# Patient Record
Sex: Male | Born: 1955 | Race: Black or African American | Hispanic: No | Smoking: Current every day smoker
Health system: Southern US, Community
[De-identification: ages and names within clinical notes are randomized; demographics above are authoritative.]

## PROBLEM LIST (undated history)

## (undated) DIAGNOSIS — R251 Tremor, unspecified: Secondary | ICD-10-CM

## (undated) DIAGNOSIS — I10 Essential (primary) hypertension: Secondary | ICD-10-CM

## (undated) DIAGNOSIS — F419 Anxiety disorder, unspecified: Secondary | ICD-10-CM

## (undated) DIAGNOSIS — R7303 Prediabetes: Secondary | ICD-10-CM

## (undated) DIAGNOSIS — I219 Acute myocardial infarction, unspecified: Secondary | ICD-10-CM

## (undated) DIAGNOSIS — I2699 Other pulmonary embolism without acute cor pulmonale: Secondary | ICD-10-CM

## (undated) DIAGNOSIS — M1990 Unspecified osteoarthritis, unspecified site: Secondary | ICD-10-CM

## (undated) DIAGNOSIS — I2510 Atherosclerotic heart disease of native coronary artery without angina pectoris: Secondary | ICD-10-CM

## (undated) DIAGNOSIS — E785 Hyperlipidemia, unspecified: Secondary | ICD-10-CM

## (undated) DIAGNOSIS — M5430 Sciatica, unspecified side: Secondary | ICD-10-CM

## (undated) HISTORY — PX: HIP SURGERY: SHX245

## (undated) HISTORY — DX: Other pulmonary embolism without acute cor pulmonale: I26.99

## (undated) HISTORY — PX: TOTAL HIP ARTHROPLASTY: SHX124

## (undated) HISTORY — PX: FRACTURE SURGERY: SHX138

---

## 2013-08-21 DIAGNOSIS — F17200 Nicotine dependence, unspecified, uncomplicated: Secondary | ICD-10-CM | POA: Insufficient documentation

## 2014-02-15 DIAGNOSIS — E785 Hyperlipidemia, unspecified: Secondary | ICD-10-CM | POA: Insufficient documentation

## 2014-02-15 DIAGNOSIS — F32A Depression, unspecified: Secondary | ICD-10-CM | POA: Insufficient documentation

## 2014-02-15 DIAGNOSIS — F419 Anxiety disorder, unspecified: Secondary | ICD-10-CM | POA: Insufficient documentation

## 2014-02-15 DIAGNOSIS — F329 Major depressive disorder, single episode, unspecified: Secondary | ICD-10-CM | POA: Insufficient documentation

## 2014-02-15 DIAGNOSIS — Z833 Family history of diabetes mellitus: Secondary | ICD-10-CM | POA: Insufficient documentation

## 2014-02-15 DIAGNOSIS — Z96643 Presence of artificial hip joint, bilateral: Secondary | ICD-10-CM | POA: Insufficient documentation

## 2014-02-15 DIAGNOSIS — G479 Sleep disorder, unspecified: Secondary | ICD-10-CM | POA: Insufficient documentation

## 2014-02-15 DIAGNOSIS — F17210 Nicotine dependence, cigarettes, uncomplicated: Secondary | ICD-10-CM | POA: Insufficient documentation

## 2014-02-15 HISTORY — DX: Sleep disorder, unspecified: G47.9

## 2014-02-15 HISTORY — DX: Depression, unspecified: F32.A

## 2014-02-15 HISTORY — DX: Hyperlipidemia, unspecified: E78.5

## 2015-04-23 ENCOUNTER — Encounter: Payer: Self-pay | Admitting: *Deleted

## 2015-04-23 ENCOUNTER — Emergency Department
Admission: EM | Admit: 2015-04-23 | Discharge: 2015-04-23 | Disposition: A | Discharge status: Home / Self Care | Payer: Medicare PPO | Source: Home / Self Care | Attending: Family Medicine | Admitting: Family Medicine

## 2015-04-23 ENCOUNTER — Emergency Department (INDEPENDENT_AMBULATORY_CARE_PROVIDER_SITE_OTHER): Payer: Medicare PPO

## 2015-04-23 DIAGNOSIS — K219 Gastro-esophageal reflux disease without esophagitis: Secondary | ICD-10-CM

## 2015-04-23 DIAGNOSIS — R142 Eructation: Secondary | ICD-10-CM

## 2015-04-23 DIAGNOSIS — R079 Chest pain, unspecified: Secondary | ICD-10-CM

## 2015-04-23 HISTORY — DX: Hyperlipidemia, unspecified: E78.5

## 2015-04-23 HISTORY — DX: Essential (primary) hypertension: I10

## 2015-04-23 LAB — CBC WITH DIFFERENTIAL/PLATELET
Basophils Absolute: 0.1 10*3/uL (ref 0.0–0.1)
Basophils Relative: 1 % (ref 0–1)
Eosinophils Absolute: 0.2 10*3/uL (ref 0.0–0.7)
Eosinophils Relative: 3 % (ref 0–5)
HCT: 46.4 % (ref 39.0–52.0)
Hemoglobin: 16 g/dL (ref 13.0–17.0)
Lymphocytes Relative: 36 % (ref 12–46)
Lymphs Abs: 2.3 10*3/uL (ref 0.7–4.0)
MCH: 30.1 pg (ref 26.0–34.0)
MCHC: 34.5 g/dL (ref 30.0–36.0)
MCV: 87.4 fL (ref 78.0–100.0)
MPV: 10.6 fL (ref 8.6–12.4)
Monocytes Absolute: 0.6 10*3/uL (ref 0.1–1.0)
Monocytes Relative: 9 % (ref 3–12)
Neutro Abs: 3.3 10*3/uL (ref 1.7–7.7)
Neutrophils Relative %: 51 % (ref 43–77)
Platelets: 226 10*3/uL (ref 150–400)
RBC: 5.31 MIL/uL (ref 4.22–5.81)
RDW: 14.1 % (ref 11.5–15.5)
WBC: 6.4 10*3/uL (ref 4.0–10.5)

## 2015-04-23 LAB — COMPLETE METABOLIC PANEL WITH GFR
ALT: 16 U/L (ref 9–46)
AST: 23 U/L (ref 10–35)
Albumin: 4.6 g/dL (ref 3.6–5.1)
Alkaline Phosphatase: 56 U/L (ref 40–115)
BUN: 12 mg/dL (ref 7–25)
CO2: 24 mmol/L (ref 20–31)
Calcium: 9.4 mg/dL (ref 8.6–10.3)
Chloride: 108 mmol/L (ref 98–110)
Creat: 1.26 mg/dL (ref 0.70–1.33)
GFR, Est African American: 72 mL/min (ref >=60)
GFR, Est Non African American: 62 mL/min (ref >=60)
Glucose, Bld: 101 mg/dL — ABNORMAL HIGH (ref 65–99)
Potassium: 3.7 mmol/L (ref 3.5–5.3)
Sodium: 140 mmol/L (ref 135–146)
Total Bilirubin: 0.5 mg/dL (ref 0.2–1.2)
Total Protein: 7.6 g/dL (ref 6.1–8.1)

## 2015-04-23 MED ORDER — FAMOTIDINE 40 MG PO TABS
40.0000 mg | ORAL_TABLET | Freq: Once | ORAL | Status: AC
  Administered 2015-04-23: 40 mg via ORAL

## 2015-04-23 MED ORDER — OMEPRAZOLE 20 MG PO CPDR
20.0000 mg | DELAYED_RELEASE_CAPSULE | Freq: Every day | ORAL | Status: DC
Start: 2015-04-23 — End: 2018-08-30

## 2015-04-23 NOTE — ED Notes (Signed)
Pt c/o intermittent center of chest pain x 2 wks with SOB worse with exertion. He has taken Alka Seltzer, and drank vinegar and water with no relief. Reports some numbness and swelling in his LT hand intermittently.

## 2015-04-23 NOTE — ED Provider Notes (Signed)
CSN: [id number]     Arrival date & time 04/23/15  4098 History   First MD Initiated Contact with Patient 04/23/15 [id number]     Chief Complaint  Patient presents with  . Chest Pain   (Consider location/radiation/quality/duration/timing/severity/associated sxs/prior Treatment) HPI Pt is a 59yo male with hx of HTN and hyperlipidemia, presenting to [facility] with c/o intermittent centralized chest pain for the last 2 weeks. Pain is described as burning and squeezing in nature.  Pain is 8/10 at worst causing SOB when pain is more severe. He states this morning symptoms started when he woke up, he feel irritation in his chest and felt a "knot" in his throat as if something was stuck but he has been able to swallow without difficulty. Symptoms lasted about 5 minutes and improved after pt got out of bed and showered.  Symptoms returned while in the grocery store this morning.  He was walking around when he felt the irritation in his throat and chest again, then became mildly short of breath.  Pt was advised by his PCP 2 weeks ago when symptoms initially started that he needed to be evaluated at an urgent care or emergency department if symptoms returned.  He has been trying Catering manager and a vinegar water mixture that had been helping his symptoms but pt is not sure what causes his symptoms.  He does not he has been belching more frequently and symptoms improve when he sits up.  Pressure is relieved after he belches. He reports having similar chest pain 4-5 years ago and went to the ER where he had a normal EKG "everything was fine."  Last annual physical was about 8 months ago, blood pressure and cholesterol were under control. No family hx of CAD. No hx of blood clots. Denies leg pain or swelling. Denies recent travel or surgeries. Denies hx of asthma or COPD.  Past Medical History  Diagnosis Date  . Hypertension   . Hyperlipidemia    Past Surgical History  Procedure Laterality Date  . Hip surgery Bilateral     Family History  Problem Relation Age of Onset  . Diabetes Mother   . Diabetes Father    Social History  Substance Use Topics  . Smoking status: Current Some Day Smoker  . Smokeless tobacco: None     Comment: trying to quit smoking, smokes 3 cigs q mth, uses vapes daily.   . Alcohol Use: No    Review of Systems  Constitutional: Negative for fever, chills and diaphoresis.  HENT: Negative for congestion, ear pain, sore throat, trouble swallowing and voice change.   Respiratory: Positive for shortness of breath. Negative for cough.   Cardiovascular: Positive for chest pain. Negative for palpitations.  Gastrointestinal: Positive for abdominal pain (upper, discomfort ). Negative for nausea, vomiting and diarrhea.       Belching  Musculoskeletal: Negative for myalgias, back pain and arthralgias.  Skin: Negative for rash.    Allergies  Penicillins  Home Medications   Prior to Admission medications   Medication Sig Start Date End Date Taking? Authorizing Provider  HYDROcodone-acetaminophen (NORCO) 10-325 MG tablet Take 1 tablet by mouth every 6 (six) hours as needed.   Yes Historical Provider, MD  omeprazole (PRILOSEC) 20 MG capsule Take 1 capsule (20 mg total) by mouth daily. 04/23/15   Junius Finner, PA-C  pravastatin (PRAVACHOL) 20 MG tablet Take 20 mg by mouth daily.   Yes Historical Provider, MD   Meds Ordered and Administered this Visit  Medications  famotidine (PEPCID) tablet 40 mg (40 mg Oral Given 04/23/15 1015)    BP 121/85 mmHg  Pulse 89  Temp(Src) 97.8 F (36.6 C) (Oral)  Resp 18  Ht 5\' 11"  (1.803 m)  Wt 205 lb (92.987 kg)  BMI 28.60 kg/m2  SpO2 99% No data found.   Physical Exam  Constitutional: He appears well-developed and well-nourished. No distress.  HENT:  Head: Normocephalic and atraumatic.  Eyes: Conjunctivae are normal. No scleral icterus.  Neck: Normal range of motion. Neck supple.  Raspy voice. No stridor   Cardiovascular: Normal rate,  regular rhythm and normal heart sounds.   Pulmonary/Chest: Effort normal and breath sounds normal. No respiratory distress. He has no wheezes. He has no rales. He exhibits no tenderness.  Abdominal: Soft. Bowel sounds are normal. He exhibits no distension and no mass. There is tenderness ( mild epigastrium). There is no rebound and no guarding.  Musculoskeletal: Normal range of motion.  Neurological: [name] alert.  Skin: Skin is warm and dry. [name] not diaphoretic.  Nursing note and vitals reviewed.   ED Course  Procedures (including critical care time)  Labs Review Labs Reviewed  CBC WITH DIFFERENTIAL/PLATELET  COMPLETE METABOLIC PANEL WITH GFR    Imaging Review Dg Chest 2 View  04/23/2015  CLINICAL DATA:  Chest pain, shortness of breath for 2 days EXAM: CHEST  2 VIEW COMPARISON:  None. FINDINGS: Cardiomediastinal silhouette is unremarkable. No acute infiltrate or pleural effusion. No pulmonary edema. Bony thorax is unremarkable. IMPRESSION: No active cardiopulmonary disease. Electronically Signed   By: Natasha MeadLiviu  Pop M.D.   On: 04/23/2015 10:56    EKG: normal sinus rhythm, within normal limits.    MDM   1. Chest pain, unspecified chest pain type   2. Gastroesophageal reflux disease, esophagitis presence not specified   3. Belching     Pt c/o intermittent centralized chest pain that is burning and squeezing, improves when he sits up. Pain has been intermittent for 2 weeks. Normal EKG. Vitals- WNL, O2 Sat 97% on RA. No evidence of respiratory distress.  Low concern for EKG at this time, however, advised pt CAD cannot be completely ruled out in urgent care setting as cardiac enzyme labs are unavailable.  Pt verbalized understanding and [facility].   CXR: normal, no evidence of cardiopulmonary disease and no mention of hiatal hernia.  Discussed EKG and CXR with pt.   CBC and CMP also ordered, will send out labs.  Pt given pepcid 40mg  in UC, moderate relief  of symptoms. Pt able to swallow water w/o difficulty.  Symptoms most c/w GERD. Doubt ACS or PE at this time. Will start on trial of Omeprazole 20mg  daily, advised pt dose may need to be increased by his PCP.   Strongly encouraged pt to call 911 or go to closest emergency department immediately if chest pain or shortness of breath worsens, persists after 5 minutes of rest, or other new concerning symptoms develop. Patient verbalized understanding and agreement with treatment plan.    Junius Finnerrin O'Malley, PA-C 04/23/15 1144

## 2015-04-24 ENCOUNTER — Telehealth: Payer: Self-pay | Admitting: *Deleted

## 2015-07-01 ENCOUNTER — Ambulatory Visit: Payer: Medicare PPO | Admitting: Osteopathic Medicine

## 2015-08-22 ENCOUNTER — Emergency Department
Admission: EM | Admit: 2015-08-22 | Discharge: 2015-08-22 | Discharge status: Absent without leave | Payer: Medicare PPO | Source: Home / Self Care

## 2015-09-09 DIAGNOSIS — R911 Solitary pulmonary nodule: Secondary | ICD-10-CM | POA: Insufficient documentation

## 2015-09-09 DIAGNOSIS — N643 Galactorrhea not associated with childbirth: Secondary | ICD-10-CM | POA: Insufficient documentation

## 2015-09-09 DIAGNOSIS — C7A8 Other malignant neuroendocrine tumors: Secondary | ICD-10-CM | POA: Insufficient documentation

## 2015-09-09 DIAGNOSIS — I2699 Other pulmonary embolism without acute cor pulmonale: Secondary | ICD-10-CM | POA: Diagnosis present

## 2015-09-09 DIAGNOSIS — R918 Other nonspecific abnormal finding of lung field: Secondary | ICD-10-CM | POA: Insufficient documentation

## 2015-09-09 DIAGNOSIS — Z86711 Personal history of pulmonary embolism: Secondary | ICD-10-CM | POA: Insufficient documentation

## 2015-09-09 HISTORY — DX: Other pulmonary embolism without acute cor pulmonale: I26.99

## 2016-06-12 ENCOUNTER — Encounter: Payer: Self-pay | Admitting: Emergency Medicine

## 2016-06-12 ENCOUNTER — Emergency Department (INDEPENDENT_AMBULATORY_CARE_PROVIDER_SITE_OTHER)
Admission: EM | Admit: 2016-06-12 | Discharge: 2016-06-12 | Disposition: A | Discharge status: Home / Self Care | Payer: Medicare PPO | Source: Home / Self Care | Attending: Family Medicine | Admitting: Family Medicine

## 2016-06-12 ENCOUNTER — Emergency Department (INDEPENDENT_AMBULATORY_CARE_PROVIDER_SITE_OTHER): Payer: Medicare PPO

## 2016-06-12 DIAGNOSIS — R0602 Shortness of breath: Secondary | ICD-10-CM | POA: Diagnosis not present

## 2016-06-12 DIAGNOSIS — R05 Cough: Secondary | ICD-10-CM | POA: Diagnosis not present

## 2016-06-12 DIAGNOSIS — F17210 Nicotine dependence, cigarettes, uncomplicated: Secondary | ICD-10-CM | POA: Diagnosis not present

## 2016-06-12 DIAGNOSIS — J40 Bronchitis, not specified as acute or chronic: Secondary | ICD-10-CM | POA: Diagnosis not present

## 2016-06-12 DIAGNOSIS — R509 Fever, unspecified: Secondary | ICD-10-CM

## 2016-06-12 MED ORDER — ALBUTEROL SULFATE HFA 108 (90 BASE) MCG/ACT IN AERS: 1.0000 | INHALATION_SPRAY | Freq: Four times a day (QID) | RESPIRATORY_TRACT | 0 refills | Status: DC | PRN

## 2016-06-12 MED ORDER — BENZONATATE 100 MG PO CAPS: 100.0000 mg | ORAL_CAPSULE | Freq: Three times a day (TID) | ORAL | 0 refills | Status: DC

## 2016-06-12 MED ORDER — AEROCHAMBER PLUS W/MASK MISC: 2 refills | Status: DC

## 2016-06-12 MED ORDER — PREDNISONE 20 MG PO TABS: ORAL_TABLET | ORAL | 0 refills | Status: DC

## 2016-06-12 NOTE — ED Triage Notes (Signed)
Reports worsening congestion and cough over past 3 days; subjective fever. No OTCs today..Marland Kitchen

## 2016-06-12 NOTE — Discharge Instructions (Signed)
°Emergency Department Resource Guide °1) Find a Doctor and Pay Out of Pocket °Although you won't have to find out who is covered by your insurance plan, it is a good idea to ask around and get recommendations. You will then need to call the office and see if the doctor you have chosen will accept you as a new patient and what types of options they offer for patients who are self-pay. Some doctors offer discounts or will set up payment plans for their patients who do not have insurance, but you will need to ask so you aren't surprised when you get to your appointment. ° °2) Contact Your Local Health Department °Not all health departments have doctors that can see patients for sick visits, but many do, so it is worth a call to see if yours does. If you don't know where your local health department is, you can check in your phone book. The CDC also has a tool to help you locate your state's health department, and many state websites also have listings of all of their local health departments. ° °3) Find a Walk-in Clinic °If your illness is not likely to be very severe or complicated, you may want to try a walk in clinic. These are popping up all over the country in pharmacies, drugstores, and shopping centers. They're usually staffed by nurse practitioners or physician assistants that have been trained to treat common illnesses and complaints. They're usually fairly quick and inexpensive. However, if you have serious medical issues or chronic medical problems, these are probably not your best option. ° °No Primary Care Doctor: °- Call [facility] Connect at  832-8000 - they can help you locate a primary care doctor that  accepts your insurance, provides certain services, etc. °- Physician Referral Service- 1-800-533-3463 ° °Chronic Pain Problems: °Organization         Address  Phone   Notes  °[facility]  (336) 297-2271 Patients need to be referred by their primary care doctor.  ° °Medication  Assistance: °Organization         Address  Phone   Notes  °[place] [facility] [address] °[place], [place] [address] (336) 641-8030 --Must be a resident of [place] °-- Must have NO insurance coverage whatsoever (no Medicaid/ Medicare, etc.) °-- The pt. MUST have a primary care doctor that directs their care regularly and follows them in the community °  °[facility]  (866) 331-1348   °[employer]  (888) 892-1162   ° °Agencies that provide inexpensive medical care: °Organization         Address  Phone   Notes  °[facility]  (336) 832-8035   °[facility]    (336) 832-7272   °[facility] [address] °[place], [place] [address] (336) 832-4777   °[facility] [address], °[place] (336) 271-4999   °[facility]    (336) 373-0678   °[facility]    (336) 272-1050   °[facility] ° [address], [place] Phone:  (336) 832-4444, Fax:  (336) 832-4440 Hours of Operation:  9 am - 6 pm, M-F.  Also accepts Medicaid/Medicare and self-pay.  °[facility] ° [address], [place] Phone: (336) 832-3150, Fax: (336) [phone]. Hours of Operation:  8:30 am - 5:30 pm, M-F.  Also accepts Medicaid and self-pay.  °[facility] [place] [address], [place] Phone: (336) 878-6027   °[facility] [address], [place], [place] (336)723-1848, Ext. 123 Mondays & Thursdays: 7-9 AM.  First 15 patients are seen on a first come, first serve basis. °  ° °Medicaid-accepting [place] Providers: ° °Organization         Address  Phone   Notes  °[facility] [address], [place] (336) 641-2100 Also accepts self-pay patients.  °[facility] [address], [place] ° (336) 856-9996   °[facility] [address], [place]  (336) 288-8857   °[facility] [address] [place] [address], [place] (336) 299-7000   °Veita Bland [address], [place]  ° (336) 373-1557 Only accepts [place] Access Medicaid patients after they have their name applied to their card.  ° °Self-Pay (no insurance) in [place]: ° °Organization         Address  Phone   Notes  °Sickle Cell Patients, [facility] [address], [place] (336) 832-1970   °[facility] [address], [place] (336) 832-4400   °[facility] ° [address], [place] (336) 992-4800   °[facility]/Dr. Osei-Bonsu ° [address] [place] [address], [place] or [address], [place] (336) 841-8500 Phone number for both [place] and [place] locations is the same.  °[facility] [address], [place] (336) 299-0000   °[facility] [address] [place] [address], [place] or [address] (336) 852-7530 °(336) 878-2260   °[facility] [address], [place] (336) 350-1642, phone; (336) 294-5005, fax Sees patients 1st and 3rd Saturday of every month.  Must not qualify for public or private insurance (i.e. Medicaid, Medicare, [place] Health Choice, Veterans' Benefits) • Household income should be no more than 200% of the poverty level •The clinic cannot treat you if you are pregnant or think you are pregnant • Sexually transmitted diseases are not treated at the clinic.  ° ° °Dental Care: °Organization         Address  Phone  Notes  °[place] [facility] [address], [place] (336) 641-6152 Accepts children up to age 21 who are enrolled in Medicaid or [place] Health Choice; pregnant women with a Medicaid card; and children who have applied for Medicaid or [place] Health Choice, but were declined, whose parents can pay a reduced fee at time of service.  °[place]  [facility] [place]  [address], [place] (336) 641-7733 Accepts children up to age 21 who are enrolled in Medicaid or [place] Health Choice; pregnant women with a Medicaid card; and children who have applied for Medicaid or [place] Health Choice, but were declined, whose parents can pay a reduced fee at time of service.  °[facility] ° [address], [place] (336) 641-4533 Patients are seen by appointment only. Walk-ins are not accepted. [facility] will see patients 18 years of age and older. °Monday - Tuesday (8am-5pm) °Most Wednesdays (8:30-5pm) °$30 per visit, cash only  °[facility] ° [address], [place] (336) 641-4533 Patients are seen by appointment only. Walk-ins are not accepted. [facility] will see patients 18 years of age and older. °One   Wednesday Evening (Monthly: Volunteer Based).  $30 per visit, cash only  °[facility]  (919) 537-3737 for adults; Children under age 4, call Graduate Pediatric Dentistry at ([phone]) [phone]. Children aged 4-14, please call (919) [phone] to request a pediatric application. ° Dental services are provided in all areas of dental care including fillings, crowns and bridges, complete and partial dentures, implants, gum treatment, root canals, and extractions. Preventive care is also provided. Treatment is provided to both adults and children. °Patients are selected via a lottery and there is often a waiting list. °  °[facility] [address], °[place] ° (336) 763-8833 [email or web address] °  °[facility] [address], [place], [place] (336)723-1848, Ext. 123 Second and Fourth Thursday of each month, opens at 6:30 AM; Clinic ends at 9 AM.  Patients are seen on a first-come first-served basis, and a limited number are seen during each clinic.  ° °[facility] ° [address], [place], [place] (336) 723-7904    Eligibility Requirements °You must have lived in [place], [place], or [place] counties for at least the last three months. °  You cannot be eligible for state or federal sponsored healthcare insurance, including Veterans Administration, Medicaid, or Medicare. °  You generally cannot be eligible for healthcare insurance through your employer.  °  How to apply: °Eligibility screenings are held every Tuesday and Wednesday afternoon from 1:00 pm until 4:00 pm. You do not need an appointment for the interview!  °[facility] [address], [place], [place] 336-631-2330   °[facility] [facility]  336-342-8273   °[facility]  336-703-3100   °[facility]  336-570-6415   ° °Behavioral Health Resources in the Community: °Intensive Outpatient Programs °Organization         Address  Phone  Notes  °[facility] [address], [place], [place] 336-878-6098   °[facility] [address], [place], [place] 336-832-9800   °[facility] [address], [place], [place] ° 336-882-2125   °[facility] [address],  °[place], [place] 1-800-853-5163 or 336-641-4981   °Substance Abuse Resources °Organization         Address  Phone  Notes  °[facility]  336-882-2125   °[facility]  336-784-9470   °[facility]  336-285-9073   °[facility]  336-845-3988   °Residential & Outpatient Substance Abuse Program  1-800-659-3381   °Psychological Services °Organization         Address  Phone  Notes  °[facility]  336- 832-9600   °[facility]  336- 378-7881   °[facility] [address], [place] 1-800-853-5163 or 336-641-4981   ° °Mobile Crisis Teams °Organization         Address  Phone  Notes  °[facility], Mobile Crisis Care Unit  1-877-626-1772   °Assertive °[facility] ° [address]  [place], [place] 336-834-9664   °Sharon DeEsch [address] °[place] 336-554-5454   ° °Self-Help/Support Groups °Organization         Address  Phone             Notes  °[facility]  - variety of support groups  336- 373-1402 Call for more information  °Narcotics Anonymous (NA), [facility] [address], °[place]  2 meetings at this location  ° °  Residential Treatment Programs °Organization         Address  Phone  Notes  °[facility] [address],    °[place]  1-866-801-8205   °[facility] ° [address], [place], [place] 704-293-8524   °[facility] [address], [place] 336-845-3988 Admissions: 8am-3pm M-F  °[facility] [address]    °[place], [place] 336-841-1104   °[facility] [address], [place], [place] 336-379-7146   °[facility] [address]  °[place], [place] 336-285-9073   °[facility] - Intensive Outpatient [address], [place], [place] 336-852-3033   °[facility] [address]  °[place], [place] 1-877-615-2722 or 336-784-9470   °[facility] [address] [place], [place] 336-227-7417 Accepts Medicaid  °[facility] [address]  °[place] 1-800-659-3381 Substance Abuse/Addiction Treatment  ° °[place] Behavioral Health Resources °Organization         Address  Phone  Notes  °[facility]  (888) 581-9988   °Julie Brannon, PhD [address] [place], [place]   (336) 349-5553 or (336) 951-0000   °[facility]   [address] °[place], [place] (336) 349-4454   °[facility] [address], [place], [place] (336) 342-8316 Insurance/Medicaid/sponsorship through [facility]  °[facility] [address]                                    [place], [place] (336) 342-8316 Therapy/tele-psych/case    °[facility] [address]  ° [place],  (336) 349-2233    °Dr. Arfeen  (336) 349-4544   °[facility] [place]  [employer] [place] [facility] 1) [address],  °2) [address], [place] °3)  [address], [place] (336) 349-3220 °(336) 342-7768 ° °(336) 342-8140   °[place] Child Abuse Hotline (336) 342-1394 or (336) 342-3537 (After Hours)    ° ° °

## 2016-06-12 NOTE — ED Provider Notes (Signed)
CSN: [id number]     Arrival date & time 06/12/16  1127 History   First MD Initiated Contact with Patient 06/12/16 1224     Chief Complaint  Patient presents with  . Nasal Congestion  . Cough   (Consider location/radiation/quality/duration/timing/severity/associated sxs/prior Treatment) HPI  Jesse [name] is a 61 y.o. male presenting to UC with c/o worsening cough and congestion for 3 days.  He states he has had SOB for about 1.5 years but over the last 3 days symptoms have worsened with night sweats and difficulty breathing when lying down.  He has a hx of a PE but notes he completed a course of his blood thinner and was advised he would no longer need to take it.  [name] requesting a referral to PCP for f/u as he does not have one at this time.     Past Medical History:  Diagnosis Date  . Hyperlipidemia   . Hypertension    Past Surgical History:  Procedure Laterality Date  . HIP SURGERY Bilateral    Family History  Problem Relation Age of Onset  . Diabetes Mother   . Diabetes Father    Social History  Substance Use Topics  . Smoking status: Current Some Day Smoker  . Smokeless tobacco: Never Used     Comment: trying to quit smoking, smokes 3 cigs q mth, uses vapes daily.   . Alcohol use No    Review of Systems  Constitutional: Negative for chills and fever.  HENT: Positive for congestion. Negative for ear pain, sore throat, trouble swallowing and voice change.   Respiratory: Positive for cough and shortness of breath.   Cardiovascular: Negative for chest pain and palpitations.  Gastrointestinal: Negative for abdominal pain, diarrhea, nausea and vomiting.  Musculoskeletal: Negative for arthralgias, back pain and myalgias.  Skin: Negative for rash.    Allergies  Penicillins  Home Medications   Prior to Admission medications   Medication Sig Start Date End Date Taking? Authorizing Provider  albuterol (PROVENTIL HFA;VENTOLIN HFA) 108 (90 Base) MCG/ACT inhaler Inhale  1-2 puffs into the lungs every 6 (six) hours as needed for wheezing or shortness of breath. 06/12/16   Junius Finner, PA-C  benzonatate (TESSALON) 100 MG capsule Take 1-2 capsules (100-200 mg total) by mouth every 8 (eight) hours. 06/12/16   Junius Finner, PA-C  HYDROcodone-acetaminophen (NORCO) 10-325 MG tablet Take 1 tablet by mouth every 6 (six) hours as needed.    Historical Provider, MD  omeprazole (PRILOSEC) 20 MG capsule Take 1 capsule (20 mg total) by mouth daily. 04/23/15   Junius Finner, PA-C  pravastatin (PRAVACHOL) 20 MG tablet Take 20 mg by mouth daily.    Historical Provider, MD  predniSONE (DELTASONE) 20 MG tablet 3 tabs po day one, then 2 po daily x 4 days 06/12/16   Junius Finner, PA-C  Spacer/Aero-Holding Chambers (AEROCHAMBER PLUS WITH MASK) inhaler Use as instructed 06/12/16   Junius Finner, PA-C   Meds Ordered and Administered this Visit  Medications - No data to display  BP 113/78 (BP Location: Left Arm)   Pulse 75   Temp 98.4 F (36.9 C) (Oral)   Resp 16   Ht 5\' 11"  (1.803 m)   Wt 197 lb (89.4 kg)   SpO2 99%   BMI 27.48 kg/m  No data found.   Physical Exam  Constitutional: [name] oriented to person, place, and time. He appears well-developed and well-nourished. No distress.  HENT:  Head: Normocephalic and atraumatic.  Right Ear: Tympanic  membrane normal.  Left Ear: Tympanic membrane normal.  Nose: Mucosal edema present. Right sinus exhibits no maxillary sinus tenderness and no frontal sinus tenderness. Left sinus exhibits no maxillary sinus tenderness and no frontal sinus tenderness.  Eyes: EOM are normal.  Neck: Normal range of motion. Neck supple.  Cardiovascular: Normal rate and regular rhythm.   Pulmonary/Chest: Effort normal. No stridor. No respiratory distress. He has wheezes ( faint expiratory). He has no rales.  Musculoskeletal: Normal range of motion.  Lymphadenopathy:    He has no cervical adenopathy.  Neurological: [name] alert and oriented to person,  place, and time.  Skin: Skin is warm and dry. [name] not diaphoretic.  Psychiatric: He has a normal mood and affect. His behavior is normal.  Nursing note and vitals reviewed.   Urgent Care Course     Procedures (including critical care time)  Labs Review Labs Reviewed - No data to display  Imaging Review Dg Chest 2 View  Result Date: 06/12/2016 CLINICAL DATA:  Pt states that for the past 3 days he has had a cough with congestion, sob, weakness and chills. Smokes 1 pk of cigarettes every 3 days. EXAM: CHEST  2 VIEW COMPARISON:  04/23/2015 FINDINGS: Midline trachea. Normal heart size and mediastinal contours. No pleural effusion or pneumothorax. Diffuse peribronchial thickening. Clear lungs. IMPRESSION: 1.  No acute cardiopulmonary disease. 2. Mild peribronchial thickening which may relate to chronic bronchitis or smoking. Electronically Signed   By: Jeronimo GreavesKyle  Talbot M.D.   On: 06/12/2016 13:08      MDM   1. Bronchitis    Pt c/o worsening SOB for 3 days with night sweats. O2 Sat 99% on RA. Doubt PE at this time.  CXR c/w chronic bronchitis changes.  Rx: Prednisone, tessalon, and albuterol inhaler.  Encouraged to establish care with a PCP. Resource guide provided. Patient verbalized understanding and agreement with treatment plan.     Junius Finnerrin O'Malley, PA-C 06/12/16 [phone]

## 2017-07-30 ENCOUNTER — Emergency Department
Admission: EM | Admit: 2017-07-30 | Discharge: 2017-07-30 | Disposition: A | Discharge status: Home / Self Care | Payer: Medicare PPO | Source: Home / Self Care | Attending: Family Medicine | Admitting: Family Medicine

## 2017-07-30 ENCOUNTER — Emergency Department (INDEPENDENT_AMBULATORY_CARE_PROVIDER_SITE_OTHER): Payer: Medicare PPO

## 2017-07-30 ENCOUNTER — Other Ambulatory Visit: Payer: Self-pay

## 2017-07-30 ENCOUNTER — Encounter: Payer: Self-pay | Admitting: Emergency Medicine

## 2017-07-30 DIAGNOSIS — M5116 Intervertebral disc disorders with radiculopathy, lumbar region: Secondary | ICD-10-CM

## 2017-07-30 DIAGNOSIS — M4317 Spondylolisthesis, lumbosacral region: Secondary | ICD-10-CM | POA: Diagnosis not present

## 2017-07-30 DIAGNOSIS — M5137 Other intervertebral disc degeneration, lumbosacral region: Secondary | ICD-10-CM

## 2017-07-30 DIAGNOSIS — M25552 Pain in left hip: Secondary | ICD-10-CM | POA: Diagnosis not present

## 2017-07-30 HISTORY — DX: Tremor, unspecified: R25.1

## 2017-07-30 HISTORY — DX: Sciatica, unspecified side: M54.30

## 2017-07-30 MED ORDER — PREDNISONE 20 MG PO TABS: ORAL_TABLET | ORAL | 0 refills | Status: DC

## 2017-07-30 MED ORDER — METHYLPREDNISOLONE SODIUM SUCC 125 MG IJ SOLR
80.0000 mg | Freq: Once | INTRAMUSCULAR | Status: AC
  Administered 2017-07-30: 80 mg via INTRAMUSCULAR

## 2017-07-30 NOTE — ED Provider Notes (Signed)
[facility]    CSN: [id number] Arrival date & time: 07/30/17  1154     History   Chief Complaint Chief Complaint  Patient presents with  . Hip Pain  . Leg Pain    HPI Jesse [name] is a 62 y.o. male.   Patient has had intermittent recurring left lower back pain for several years, which has become constant during the past 2 weeks.  The pain now frequently radiates to his left lateral thigh and below to his left calf.  He occasionally has brief tingling in his left foot and toes.  He notes that the pain decreases when he flexes his left knee and hip together.   He denies bowel or bladder dysfunction, and no saddle numbness.  He has a past history of left hip replacement 2012 and right hip replacement 2014 because of osteoarthritis.  He feels well otherwise.  No fevers, chills, and sweats or weight loss.   The history is provided by the patient.  Back Pain  Location:  Lumbar spine and sacro-iliac joint Quality:  Aching Radiates to:  L posterior upper leg, L thigh and L foot Pain severity:  Mild Pain is:  Same all the time Onset quality:  Gradual Duration:  2 weeks Timing:  Constant Progression:  Waxing and waning Chronicity:  Chronic Context: not recent injury   Relieved by:  Nothing Worsened by:  Movement and ambulation Ineffective treatments:  OTC medications Associated symptoms: numbness and paresthesias   Associated symptoms: no abdominal pain, no abdominal swelling, no bladder incontinence, no bowel incontinence, no chest pain, no dysuria, no fever, no headaches, no perianal numbness, no tingling, no weakness and no weight loss     Past Medical History:  Diagnosis Date  . Hyperlipidemia   . Hypertension   . Sciatic nerve pain   . Tremors of nervous system     There are no active problems to display for this patient.   Past Surgical History:  Procedure Laterality Date  . HIP SURGERY Bilateral   . TOTAL HIP ARTHROPLASTY Bilateral        Home  Medications    Prior to Admission medications   Medication Sig Start Date End Date Taking? Authorizing Provider  albuterol (PROVENTIL HFA;VENTOLIN HFA) 108 (90 Base) MCG/ACT inhaler Inhale 1-2 puffs into the lungs every 6 (six) hours as needed for wheezing or shortness of breath. 06/12/16   Lurene Shadow, PA-C  benzonatate (TESSALON) 100 MG capsule Take 1-2 capsules (100-200 mg total) by mouth every 8 (eight) hours. 06/12/16   Lurene Shadow, PA-C  HYDROcodone-acetaminophen (NORCO) 10-325 MG tablet Take 1 tablet by mouth every 6 (six) hours as needed.    [provider]  omeprazole (PRILOSEC) 20 MG capsule Take 1 capsule (20 mg total) by mouth daily. 04/23/15   Lurene Shadow, PA-C  pravastatin (PRAVACHOL) 20 MG tablet Take 20 mg by mouth daily.    [provider]  predniSONE (DELTASONE) 20 MG tablet Take one tab by mouth twice daily for 4 days, then one daily for 3 days. Take with food. 07/30/17   Lattie Haw, MD  Spacer/Aero-Holding Chambers (AEROCHAMBER PLUS WITH MASK) inhaler Use as instructed 06/12/16   Lurene Shadow, PA-C    Family History Family History  Problem Relation Age of Onset  . Diabetes Mother   . Diabetes Father     Social History Social History   Tobacco Use  . Smoking status: Current Some Day Smoker  . Smokeless  tobacco: Never Used  . Tobacco comment: trying to quit smoking, smokes 3 cigs q mth, uses vapes daily.   Substance Use Topics  . Alcohol use: No  . Drug use: No     Allergies   Penicillins   Review of Systems Review of Systems  Constitutional: Negative for fever and weight loss.  Cardiovascular: Negative for chest pain.  Gastrointestinal: Negative for abdominal pain and bowel incontinence.  Genitourinary: Negative for bladder incontinence and dysuria.  Musculoskeletal: Positive for back pain.  Neurological: Positive for numbness and paresthesias. Negative for tingling, weakness and headaches.  All other systems reviewed and  are negative.    Physical Exam Triage Vital Signs ED Triage Vitals  Enc Vitals Group     BP      Pulse      Resp      Temp      Temp src      SpO2      Weight      Height      Head Circumference      Peak Flow      Pain Score      Pain Loc      Pain Edu?      Excl. in GC?    No data found.  Updated Vital Signs BP (!) 136/95 (BP Location: Right Arm)   Pulse 79   Temp 97.9 F (36.6 C) (Oral)   Resp 16   Ht 5\' 11"  (1.803 m)   Wt 195 lb (88.5 kg)   SpO2 99%   BMI 27.20 kg/m   Visual Acuity Right Eye Distance:   Left Eye Distance:   Bilateral Distance:    Right Eye Near:   Left Eye Near:    Bilateral Near:     Physical Exam  Constitutional: He appears well-developed and well-nourished. No distress.  HENT:  Head: Normocephalic.  Right Ear: External ear normal.  Left Ear: External ear normal.  Nose: Nose normal.  Mouth/Throat: Oropharynx is clear and moist.  Eyes: Pupils are equal, round, and reactive to light.  Neck: Normal range of motion.  Cardiovascular: Normal heart sounds.  Pulmonary/Chest: Breath sounds normal.  Abdominal: Soft. There is no tenderness.  Musculoskeletal: He exhibits no edema.       Back:  Back:   Can heel/toe walk and squat without difficulty.  Decreased forward flexion.  Tenderness in the midline from L1 to sacrum, and left paraspinous tenderness from L3 to SI joint.  Straight leg raising test is positive on the left..  Sitting knee extension test is positive on the left.  Strength and sensation in the lower extremities is normal.  Patellar and achilles reflexes are normal.  Left hip has decreased internal/external rotational range of motion.  Neurological: [name] alert.  Skin: Skin is warm and dry. No rash noted.  Nursing note and vitals reviewed.    UC Treatments / Results  Labs (all labs ordered are listed, but only abnormal results are displayed) Labs Reviewed - No data to display  EKG  EKG Interpretation None        Radiology Dg Lumbar Spine Complete  Result Date: 07/30/2017 CLINICAL DATA:  Left lower back pain radiating to the left hip for the past 6 months. EXAM: LUMBAR SPINE - COMPLETE 4+ VIEW COMPARISON:  None. FINDINGS: Five lumbar type vertebral bodies. No acute fracture or subluxation. Vertebral body heights are preserved. Trace anterolisthesis at L5-S1. Possible L5 pars defects. Moderate disc height loss at L5-S1. Remaining  intervertebral disc spaces are maintained. Mild bilateral facet arthropathy at L4-L5 and L5-S1. IMPRESSION: 1. Moderate degenerative disc disease at L5-S1. 2. Trace anterolisthesis at L5-S1 with possible L5 pars defects. Electronically Signed   By: Obie DredgeWilliam T Derry M.D.   On: 07/30/2017 13:22   Dg Hip Unilat W Or Wo Pelvis 2-3 Views Left  Result Date: 07/30/2017 CLINICAL DATA:  Increasing left hip pain radiating to the low back over the past 6 months. EXAM: DG HIP (WITH OR WITHOUT PELVIS) 2-3V LEFT COMPARISON:  None. FINDINGS: Postsurgical changes related to bilateral total hip arthroplasties. No evidence of hardware failure or loosening. No acute fracture or dislocation. The pubic symphysis and sacroiliac joints are intact. Osteopenia. IMPRESSION: 1. Prior bilateral total hip arthroplasties. No acute osseous abnormality. Electronically Signed   By: Obie DredgeWilliam T Derry M.D.   On: 07/30/2017 13:19    Procedures Procedures (including critical care time)  Medications Ordered in UC Medications  methylPREDNISolone sodium succinate (SOLU-MEDROL) 125 mg/2 mL injection 80 mg (80 mg Intramuscular Given 07/30/17 1359)     Initial Impression / Assessment and Plan / UC Course  I have reviewed the triage vital signs and the nursing notes.  Pertinent labs & imaging results that were available during my care of the patient were reviewed by me and considered in my medical decision making (see chart for details).    Administered Solumedrol 80mg  IM. Begin prednisone burst/taper Saturday  07/31/17. Apply ice pack to lower back for 20 to 30 minutes, 3 to 4 times daily  Continue until pain decreases.  Followup with Dr. Rodney Langtonhomas Thekkekandam or Dr. Clementeen GrahamEvan Corey ([facility]) in about 5 days for further management.    Final Clinical Impressions(s) / UC Diagnoses   Final diagnoses:  Lumbar disc disease with radiculopathy    ED Discharge Orders        Ordered    predniSONE (DELTASONE) 20 MG tablet     07/30/17 1354           Lattie HawBeese, Sadia Belfiore A, MD 07/30/17 1621

## 2017-07-30 NOTE — Discharge Instructions (Signed)
Begin prednisone Saturday 07/31/17. Apply ice pack to lower back for 20 to 30 minutes, 3 to 4 times daily  Continue until pain decreases.

## 2017-07-30 NOTE — ED Triage Notes (Signed)
Patient has had intermittent pain in hips and legs for at least 5 years; once was told he has sciatic nerve pain; at one time was on hydrocodone and anti-hypertensive and meds for hyperlipidemia but does not get consistent medical care.

## 2017-08-05 ENCOUNTER — Encounter: Payer: Medicare PPO | Admitting: Family Medicine

## 2017-08-05 DIAGNOSIS — Z0189 Encounter for other specified special examinations: Secondary | ICD-10-CM

## 2017-11-05 ENCOUNTER — Other Ambulatory Visit: Payer: Self-pay

## 2017-11-05 ENCOUNTER — Encounter: Payer: Self-pay | Admitting: Emergency Medicine

## 2017-11-05 ENCOUNTER — Emergency Department (INDEPENDENT_AMBULATORY_CARE_PROVIDER_SITE_OTHER)
Admission: EM | Admit: 2017-11-05 | Discharge: 2017-11-05 | Disposition: A | Discharge status: Home / Self Care | Payer: Medicare PPO | Source: Home / Self Care | Attending: Family Medicine | Admitting: Family Medicine

## 2017-11-05 DIAGNOSIS — G8929 Other chronic pain: Secondary | ICD-10-CM

## 2017-11-05 DIAGNOSIS — M5442 Lumbago with sciatica, left side: Secondary | ICD-10-CM

## 2017-11-05 DIAGNOSIS — M25511 Pain in right shoulder: Secondary | ICD-10-CM

## 2017-11-05 MED ORDER — MELOXICAM 15 MG PO TABS: 15.0000 mg | ORAL_TABLET | Freq: Every day | ORAL | 0 refills | Status: DC | PRN

## 2017-11-05 MED ORDER — PREDNISONE 20 MG PO TABS: ORAL_TABLET | ORAL | 0 refills | Status: DC

## 2017-11-05 NOTE — ED Triage Notes (Signed)
Patient having flare of chronic back, hip and shoulder pains; out of medication; did not follow up with sports medicine.

## 2017-11-05 NOTE — Discharge Instructions (Signed)
°  Meloxicam (Mobic) is an antiinflammatory to help with pain and inflammation.  Do not take ibuprofen, Advil, Aleve, or any other medications that contain NSAIDs while taking meloxicam as this may cause stomach upset or even ulcers if taken in large amounts for an extended period of time.   Please take the prednisone as prescribed.  It should be completed in 5 days.  You may then take the meloxicam once daily as needed.   Please call to schedule a follow up appointment with Sports Medicine, Dr. Denyse Amassorey or Dr. Benjamin Stainhekkekandam (Dr. Karie Schwalbe) for further evaluation and treatment of your chronic shoulder, lower back and hip pain.

## 2017-11-05 NOTE — ED Provider Notes (Signed)
[facility]    CSN: [id number] Arrival date & time: 11/05/17  1101     History   Chief Complaint Chief Complaint  Patient presents with  . Hip Pain    left  . Back Pain  . Shoulder Pain    right    HPI Jesse MuttonMalik Mccorvey is a 62 y.o. male.   HPI Jesse [name] is a 62 y.o. male presenting to UC with c/o gradually worsening Right shoulder and Left lower back pain over the last 2-3 weeks.  Pain has been intermittent for several months.  Pain is aching and sore, moderate in severity. Minimal relief with ibuprofen.  He was seen for same on 07/30/17. He was prescribed prednisone at the time and pain improved significantly.  He was advised to f/u with Sports Medicine but pt "chickened out" and did not wind up going to his appointment. He denies new injuries but states he has a lot of pain from working [employer] for several years.  He also reports having bilateral hip replacement due to osteoarthritis.  [name] requesting a refill of his prednisone.    Past Medical History:  Diagnosis Date  . Hyperlipidemia   . Hypertension   . Sciatic nerve pain   . Tremors of nervous system     There are no active problems to display for this patient.   Past Surgical History:  Procedure Laterality Date  . HIP SURGERY Bilateral   . TOTAL HIP ARTHROPLASTY Bilateral        Home Medications    Prior to Admission medications   Medication Sig Start Date End Date Taking? Authorizing Provider  albuterol (PROVENTIL HFA;VENTOLIN HFA) 108 (90 Base) MCG/ACT inhaler Inhale 1-2 puffs into the lungs every 6 (six) hours as needed for wheezing or shortness of breath. 06/12/16   Lurene ShadowPhelps, Keylani Perlstein O, PA-C  benzonatate (TESSALON) 100 MG capsule Take 1-2 capsules (100-200 mg total) by mouth every 8 (eight) hours. 06/12/16   Lurene ShadowPhelps, Alfreddie Consalvo O, PA-C  HYDROcodone-acetaminophen (NORCO) 10-325 MG tablet Take 1 tablet by mouth every 6 (six) hours as needed.    [provider]  meloxicam (MOBIC) 15 MG tablet  Take 1 tablet (15 mg total) by mouth daily as needed for pain. 11/05/17   Lurene ShadowPhelps, Rubin Dais O, PA-C  omeprazole (PRILOSEC) 20 MG capsule Take 1 capsule (20 mg total) by mouth daily. 04/23/15   Lurene ShadowPhelps, Talma Aguillard O, PA-C  pravastatin (PRAVACHOL) 20 MG tablet Take 20 mg by mouth daily.    [provider]  predniSONE (DELTASONE) 20 MG tablet 3 tabs po day one, then 2 po daily x 4 days 11/05/17   Lurene ShadowPhelps, Hildegard Hlavac O, PA-C  Spacer/Aero-Holding Chambers (AEROCHAMBER PLUS WITH MASK) inhaler Use as instructed 06/12/16   Lurene ShadowPhelps, Valon Glasscock O, PA-C    Family History Family History  Problem Relation Age of Onset  . Diabetes Mother   . Diabetes Father     Social History Social History   Tobacco Use  . Smoking status: Current Some Day Smoker  . Smokeless tobacco: Never Used  . Tobacco comment: trying to quit smoking, smokes 3 cigs q mth, uses vapes daily.   Substance Use Topics  . Alcohol use: No  . Drug use: No     Allergies   Penicillins   Review of Systems Review of Systems  Constitutional: Negative for chills and fever.  Genitourinary: Negative for dysuria, flank pain and frequency.  Musculoskeletal: Positive for arthralgias, back pain and myalgias. Negative for joint swelling.  Skin:  Negative for rash.  Neurological: Negative for weakness and numbness.     Physical Exam Triage Vital Signs ED Triage Vitals  Enc Vitals Group     BP 11/05/17 1147 118/85     Pulse Rate 11/05/17 1147 69     Resp 11/05/17 1147 18     Temp 11/05/17 1147 97.9 F (36.6 C)     Temp Source 11/05/17 1147 Oral     SpO2 11/05/17 1147 100 %     Weight 11/05/17 1149 215 lb (97.5 kg)     Height 11/05/17 1149 5\' 11"  (1.803 m)     Head Circumference --      Peak Flow --      Pain Score 11/05/17 1148 6     Pain Loc --      Pain Edu? --      Excl. in GC? --    No data found.  Updated Vital Signs BP 118/85 (BP Location: Right Arm)   Pulse 69   Temp 97.9 F (36.6 C) (Oral)   Resp 18   Ht 5\' 11"  (1.803 m)   Wt  215 lb (97.5 kg)   SpO2 100%   BMI 29.99 kg/m   Visual Acuity Right Eye Distance:   Left Eye Distance:   Bilateral Distance:    Right Eye Near:   Left Eye Near:    Bilateral Near:     Physical Exam  Constitutional: [name] oriented to person, place, and time. He appears well-developed and well-nourished. No distress.  HENT:  Head: Normocephalic and atraumatic.  Eyes: EOM are normal.  Neck: Normal range of motion.  Cardiovascular: Normal rate and regular rhythm.  Pulses:      Radial pulses are 2+ on the right side, and 2+ on the left side.  Pulmonary/Chest: Effort normal. No respiratory distress.  Musculoskeletal: Normal range of motion. He exhibits tenderness. He exhibits no edema.  No midline spinal tenderness. Tenderness to Left lower lumbar muscles and Left buttock. Negative straight leg raise. Right shoulder: diffuse tenderness. Full ROM w/o crepitus. 5/5 grip strength.  Neurological: [name] alert and oriented to person, place, and time.  Skin: Skin is warm and dry. Capillary refill takes less than 2 seconds. No rash noted. [name] not diaphoretic.  Psychiatric: He has a normal mood and affect. His behavior is normal.  Nursing note and vitals reviewed.    UC Treatments / Results  Labs (all labs ordered are listed, but only abnormal results are displayed) Labs Reviewed - No data to display  EKG None  Radiology No results found.  Procedures Procedures (including critical care time)  Medications Ordered in UC Medications - No data to display  Initial Impression / Assessment and Plan / UC Course  I have reviewed the triage vital signs and the nursing notes.  Pertinent labs & imaging results that were available during my care of the patient were reviewed by me and considered in my medical decision making (see chart for details).     Hx and exam c/w exacerbation of chronic pain secondary to OA. No red flag symptoms. No new injuries. No indications for imaging at  this time.   Final Clinical Impressions(s) / UC Diagnoses   Final diagnoses:  Chronic right shoulder pain  Chronic left-sided low back pain with left-sided sciatica     Discharge Instructions      Meloxicam (Mobic) is an antiinflammatory to help with pain and inflammation.  Do not take ibuprofen, Advil, Aleve, or  any other medications that contain NSAIDs while taking meloxicam as this may cause stomach upset or even ulcers if taken in large amounts for an extended period of time.   Please take the prednisone as prescribed.  It should be completed in 5 days.  You may then take the meloxicam once daily as needed.   Please call to schedule a follow up appointment with Sports Medicine, Dr. Denyse Amass or Dr. Benjamin Stain (Dr. Karie Schwalbe) for further evaluation and treatment of your chronic shoulder, lower back and hip pain.   ED Prescriptions    Medication Sig Dispense Auth. Provider   predniSONE (DELTASONE) 20 MG tablet 3 tabs po day one, then 2 po daily x 4 days 11 tablet Alanzo Lamb O, PA-C   meloxicam (MOBIC) 15 MG tablet Take 1 tablet (15 mg total) by mouth daily as needed for pain. 30 tablet Lurene Shadow, PA-C     Controlled Substance Prescriptions [place] Controlled Substance Registry consulted? Not Applicable   Rolla Plate 11/05/17 1337

## 2018-06-13 ENCOUNTER — Other Ambulatory Visit: Payer: Self-pay

## 2018-06-13 ENCOUNTER — Emergency Department (INDEPENDENT_AMBULATORY_CARE_PROVIDER_SITE_OTHER)
Admission: EM | Admit: 2018-06-13 | Discharge: 2018-06-13 | Disposition: A | Discharge status: Home / Self Care | Payer: Medicare PPO | Source: Home / Self Care

## 2018-06-13 DIAGNOSIS — I10 Essential (primary) hypertension: Secondary | ICD-10-CM | POA: Diagnosis not present

## 2018-06-13 DIAGNOSIS — Z23 Encounter for immunization: Secondary | ICD-10-CM | POA: Diagnosis not present

## 2018-06-13 DIAGNOSIS — G5622 Lesion of ulnar nerve, left upper limb: Secondary | ICD-10-CM

## 2018-06-13 DIAGNOSIS — R55 Syncope and collapse: Secondary | ICD-10-CM | POA: Diagnosis not present

## 2018-06-13 MED ORDER — AMLODIPINE BESYLATE 5 MG PO TABS: 5.0000 mg | ORAL_TABLET | Freq: Every day | ORAL | 1 refills | Status: DC

## 2018-06-13 MED ORDER — INFLUENZA VAC SPLIT QUAD 0.5 ML IM SUSY
0.5000 mL | PREFILLED_SYRINGE | INTRAMUSCULAR | Status: AC
  Administered 2018-06-13: 0.5 mL via INTRAMUSCULAR

## 2018-06-13 NOTE — ED Triage Notes (Signed)
Pt is here for a flu shot, BP 156/102.  Proceeded to explain was at [employer] several weeks ago and passed out, and has not been feeling well since.  Wants to be checked out.

## 2018-06-13 NOTE — ED Provider Notes (Signed)
[facility]    CSN: [id number] Arrival date & time: 06/13/18  1538     History   Chief Complaint Chief Complaint  Patient presents with  . Dizziness    HPI Jesse [name] is a 63 y.o. male.   Pt is here for a flu shot, BP 156/102.  Proceeded to explain was at [employer] several weeks ago and passed out, and has not been feeling well since.  Wants to be checked out.  This situation from 2 weeks ago visit the patient had a job interview with a trucking company.  He was there quite a while and his blood pressure was elevated on 2 different occasions.  He then went shopping and was in a long line.  He felt his vision getting diminished, then felt sweaty, then felt nauseated and another person line helped him to the floor.  He was seen by EMS who checked his blood pressure, EKG, and blood sugar and all were normal.  Patient then went home and has had no further symptoms.  Family history is positive for hypertension with both mother and father having this condition.  Patient also has a bothersome intermittent tremor in his left arm.  He has some pain in the ulnar distribution of his forearm.  This is been getting worse over the last year.  He has had no trauma there.  [name] left-handed so it does occasionally interfere with his ability to sign his name.     Past Medical History:  Diagnosis Date  . Hyperlipidemia   . Hypertension   . Sciatic nerve pain   . Tremors of nervous system     There are no active problems to display for this patient.   Past Surgical History:  Procedure Laterality Date  . HIP SURGERY Bilateral   . TOTAL HIP ARTHROPLASTY Bilateral        Home Medications    Prior to Admission medications   Medication Sig Start Date End Date Taking? Authorizing Provider  albuterol (PROVENTIL HFA;VENTOLIN HFA) 108 (90 Base) MCG/ACT inhaler Inhale 1-2 puffs into the lungs every 6 (six) hours as needed for wheezing or shortness of breath. 06/12/16    Lurene ShadowPhelps, Erin O, PA-C  amLODipine (NORVASC) 5 MG tablet Take 1 tablet (5 mg total) by mouth daily. 06/13/18   Elvina SidleLauenstein, Derec Mozingo, MD  benzonatate (TESSALON) 100 MG capsule Take 1-2 capsules (100-200 mg total) by mouth every 8 (eight) hours. 06/12/16   Lurene ShadowPhelps, Erin O, PA-C  HYDROcodone-acetaminophen (NORCO) 10-325 MG tablet Take 1 tablet by mouth every 6 (six) hours as needed.    [provider]  meloxicam (MOBIC) 15 MG tablet Take 1 tablet (15 mg total) by mouth daily as needed for pain. 11/05/17   Lurene ShadowPhelps, Erin O, PA-C  omeprazole (PRILOSEC) 20 MG capsule Take 1 capsule (20 mg total) by mouth daily. 04/23/15   Lurene ShadowPhelps, Erin O, PA-C  pravastatin (PRAVACHOL) 20 MG tablet Take 20 mg by mouth daily.    [provider]  predniSONE (DELTASONE) 20 MG tablet 3 tabs po day one, then 2 po daily x 4 days 11/05/17   Lurene ShadowPhelps, Erin O, PA-C  Spacer/Aero-Holding Chambers (AEROCHAMBER PLUS WITH MASK) inhaler Use as instructed 06/12/16   Lurene ShadowPhelps, Erin O, PA-C    Family History Family History  Problem Relation Age of Onset  . Diabetes Mother   . Diabetes Father     Social History Social History   Tobacco Use  . Smoking status: Current Some Day Smoker  .  Smokeless tobacco: Never Used  . Tobacco comment: trying to quit smoking, smokes 3 cigs q mth, uses vapes daily.   Substance Use Topics  . Alcohol use: No  . Drug use: No     Allergies   Penicillins   Review of Systems Review of Systems   Physical Exam Triage Vital Signs ED Triage Vitals  Enc Vitals Group     BP 06/13/18 1604 (!) 156/102     Pulse Rate 06/13/18 1604 89     Resp 06/13/18 1604 20     Temp 06/13/18 1604 97.9 F (36.6 C)     Temp Source 06/13/18 1604 Oral     SpO2 06/13/18 1604 99 %     Weight 06/13/18 1605 215 lb (97.5 kg)     Height 06/13/18 1605 5\' 11"  (1.803 m)     Head Circumference --      Peak Flow --      Pain Score 06/13/18 1605 0     Pain Loc --      Pain Edu? --      Excl. in GC? --    No data  found.  Updated Vital Signs BP (!) 156/102 (BP Location: Right Arm)   Pulse 89   Temp 97.9 F (36.6 C) (Oral)   Resp 20   Ht 5\' 11"  (1.803 m)   Wt 97.5 kg   SpO2 99%   BMI 29.99 kg/m    Physical Exam Vitals signs and nursing note reviewed.  Constitutional:      Appearance: Normal appearance. [name] normal weight.  HENT:     Head: Normocephalic and atraumatic.     Right Ear: Tympanic membrane, ear canal and external ear normal.     Left Ear: Tympanic membrane, ear canal and external ear normal.     Nose: Nose normal.     Mouth/Throat:     Mouth: Mucous membranes are moist.     Pharynx: Oropharynx is clear.  Eyes:     Extraocular Movements: Extraocular movements intact.     Conjunctiva/sclera: Conjunctivae normal.     Pupils: Pupils are equal, round, and reactive to light.  Neck:     Musculoskeletal: Normal range of motion and neck supple.  Cardiovascular:     Rate and Rhythm: Normal rate and regular rhythm.     Heart sounds: Normal heart sounds.  Pulmonary:     Effort: Pulmonary effort is normal.  Abdominal:     General: Bowel sounds are normal.     Palpations: Abdomen is soft. There is no mass.     Tenderness: There is no abdominal tenderness.  Musculoskeletal: Normal range of motion.  Skin:    General: Skin is warm and dry.  Neurological:     General: No focal deficit present.     Mental Status: [name] alert and oriented to person, place, and time.  Psychiatric:        Mood and Affect: Mood normal.      UC Treatments / Results  Labs (all labs ordered are listed, but only abnormal results are displayed) Labs Reviewed - No data to display  EKG None  Radiology No results found.  Procedures Procedures (including critical care time)  Medications Ordered in UC Medications - No data to display  Initial Impression / Assessment and Plan / UC Course  I have reviewed the triage vital signs and the nursing notes.  Pertinent labs & imaging results that were  available during my care of the  patient were reviewed by me and considered in my medical decision making (see chart for details).    Final Clinical Impressions(s) / UC Diagnoses   Final diagnoses:  Vasovagal syncope  Essential hypertension  Ulnar neuropathy of left upper extremity     Discharge Instructions     Please have your personal doctor recheck your blood pressure or return here in 4-6 weeks.    ED Prescriptions    Medication Sig Dispense Auth. Provider   amLODipine (NORVASC) 5 MG tablet Take 1 tablet (5 mg total) by mouth daily. 90 tablet Elvina Sidle, MD     Controlled Substance Prescriptions [place] Controlled Substance Registry consulted? Not Applicable   Elvina Sidle, MD 06/13/18 [phone]

## 2018-06-13 NOTE — Discharge Instructions (Addendum)
Please have your personal doctor recheck your blood pressure or return here in 4-6 weeks.

## 2018-08-11 IMAGING — DX DG CHEST 2V
2 series · 2 of 2 positions shown · non-contrast
Comparison: 04/23/2015

CLINICAL DATA: Pt states that for the past 3 days he has had a
cough with congestion, sob, weakness and chills. Smokes 1 pk of
cigarettes every 3 days.

EXAM:
CHEST  2 VIEW

[chest pa]
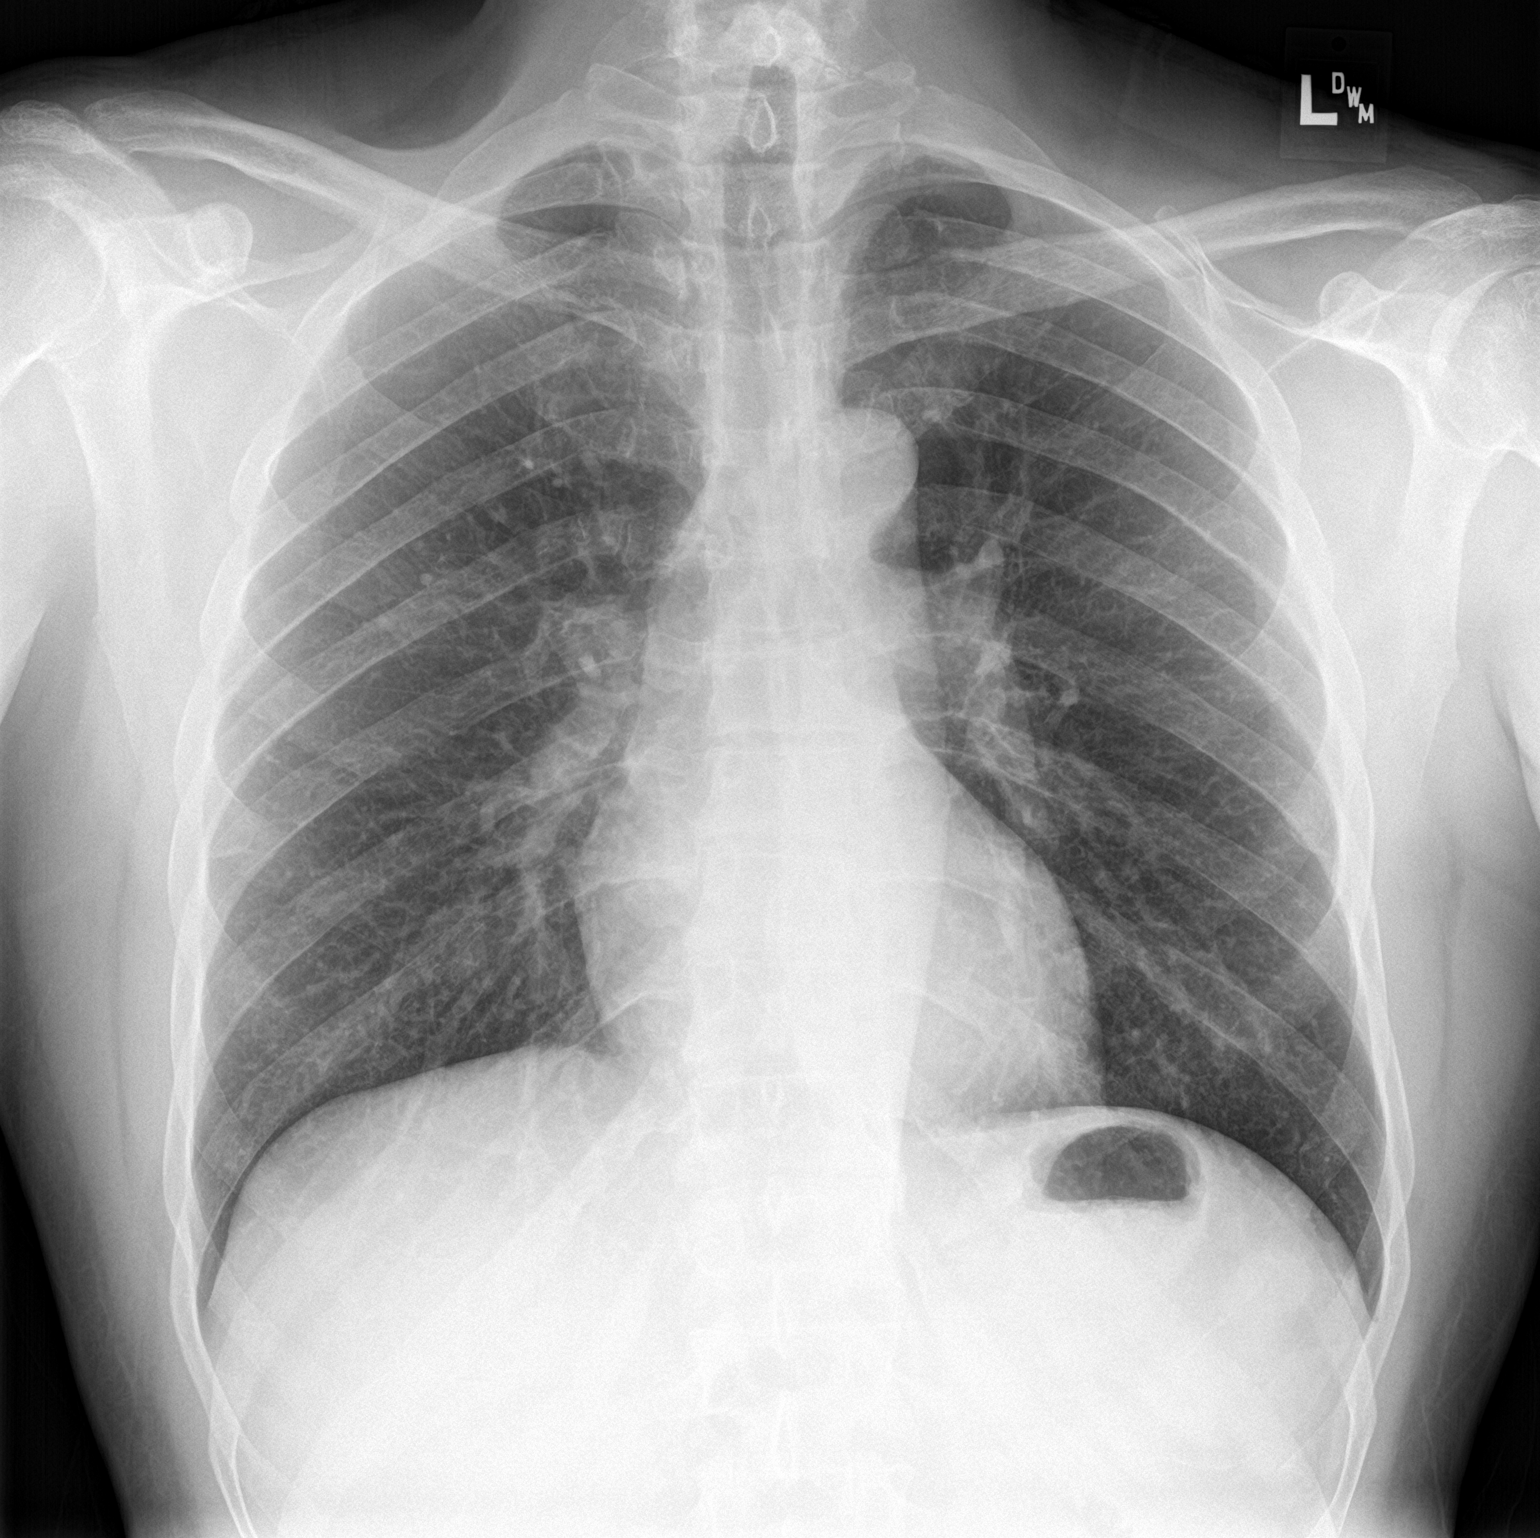

[chest lat]
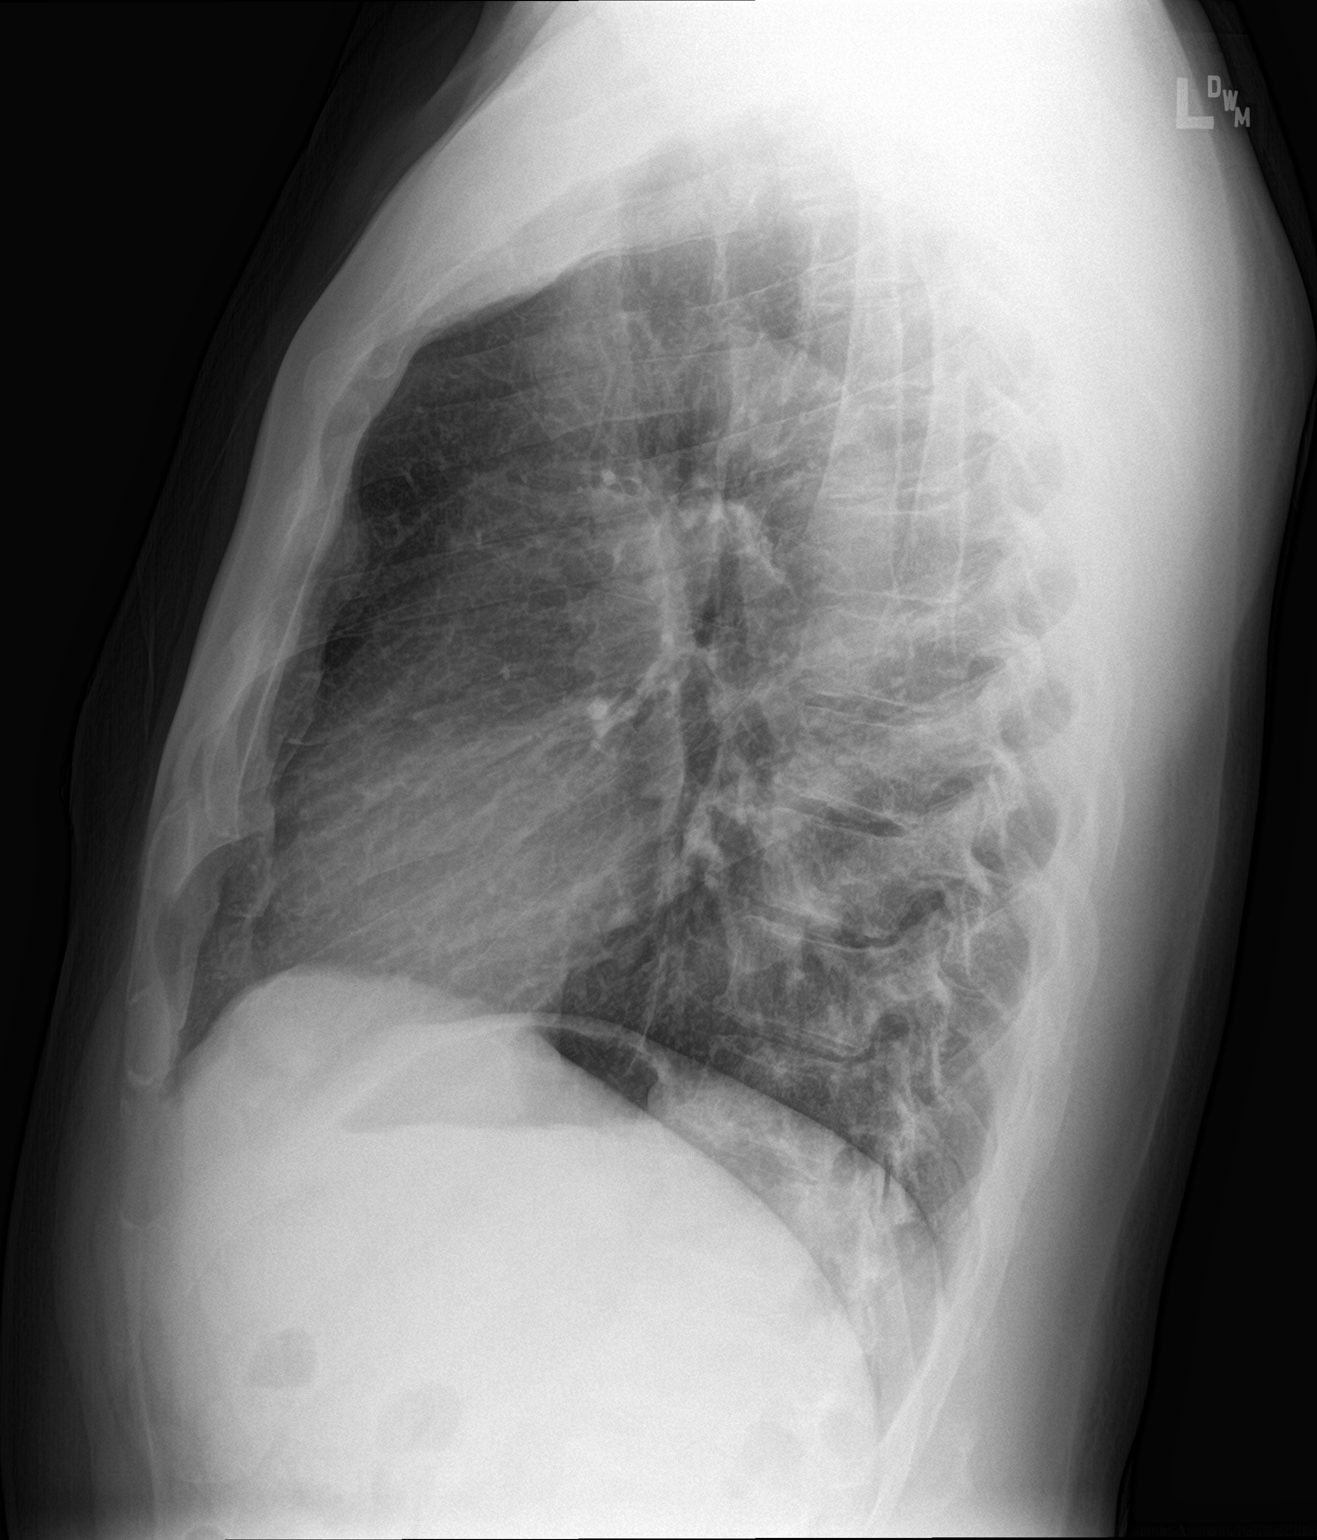

[2 of 2 positions shown; findings below may reference images not displayed]

FINDINGS: Midline trachea. Normal heart size and mediastinal contours. No
pleural effusion or pneumothorax. Diffuse peribronchial thickening.
Clear lungs.
IMPRESSION: 1.  No acute cardiopulmonary disease.
2. Mild peribronchial thickening which may relate to chronic
bronchitis or smoking.

## 2018-08-30 ENCOUNTER — Encounter: Payer: Self-pay | Admitting: Family Medicine

## 2018-08-30 ENCOUNTER — Ambulatory Visit (INDEPENDENT_AMBULATORY_CARE_PROVIDER_SITE_OTHER): Payer: Medicare PPO | Admitting: Family Medicine

## 2018-08-30 VITALS — BP 122/99 | HR 74 | Ht 71.0 in | Wt 213.0 lb

## 2018-08-30 DIAGNOSIS — G250 Essential tremor: Secondary | ICD-10-CM | POA: Diagnosis not present

## 2018-08-30 DIAGNOSIS — M7062 Trochanteric bursitis, left hip: Secondary | ICD-10-CM

## 2018-08-30 DIAGNOSIS — Z86711 Personal history of pulmonary embolism: Secondary | ICD-10-CM

## 2018-08-30 DIAGNOSIS — Z96643 Presence of artificial hip joint, bilateral: Secondary | ICD-10-CM | POA: Diagnosis not present

## 2018-08-30 DIAGNOSIS — E782 Mixed hyperlipidemia: Secondary | ICD-10-CM

## 2018-08-30 HISTORY — DX: Trochanteric bursitis, left hip: M70.62

## 2018-08-30 MED ORDER — PROPRANOLOL HCL ER 60 MG PO CP24: 60.0000 mg | ORAL_CAPSULE | Freq: Every day | ORAL | 1 refills | Status: DC

## 2018-08-30 MED ORDER — PRAVASTATIN SODIUM 40 MG PO TABS: 40.0000 mg | ORAL_TABLET | Freq: Every day | ORAL | 1 refills | Status: DC

## 2018-08-30 NOTE — Patient Instructions (Addendum)
Thank you for coming in today.  I think the tremor is due to Essential Tremor.  Take propranolol daily Recheck in 1 month.   For the left hip pain work on the side leg raises.  Do the cross over stretch.  Recheck in 1 month.   Restart the prastatin for cholesterol.   Essential Tremor A tremor is trembling or shaking that a person cannot control. Most tremors affect the hands or arms. Tremors can also affect the head, vocal cords, legs, and other parts of the body. Essential tremor is a tremor without a known cause. Usually, it occurs while a person is trying to perform an action. It tends to get worse gradually as a person ages. What are the causes? The cause of this condition is not known. What increases the risk? You are more likely to develop this condition if:  You have a family member with essential tremor.  You are age 62 or older.  You take certain medicines. What are the signs or symptoms? The main sign of a tremor is a rhythmic shaking of certain parts of your body that is uncontrolled and unintentional. You may:  Have difficulty eating with a spoon or fork.  Have difficulty writing.  Nod your head up and down or side to side.  Have a quivering voice. The shaking may:  Get worse over time.  Come and go.  Be more noticeable on one side of your body.  Get worse due to stress, fatigue, caffeine, and extreme heat or cold. How is this diagnosed? This condition may be diagnosed based on:  Your symptoms and medical history.  A physical exam. There is no single test to diagnose an essential tremor. However, your health care provider may order tests to rule out other causes of your condition. These may include:  Blood and urine tests.  Imaging studies of your brain, such as CT scan and MRI.  A test that measures involuntary muscle movement (electromyogram). How is this treated? Treatment for essential tremor depends on the severity of the condition.  Some  tremors may go away without treatment.  Mild tremors may not need treatment if they do not affect your day-to-day life.  Severe tremors may need to be treated using one or more of the following options: ? Medicines. ? Lifestyle changes. ? Occupational or physical therapy. Follow these instructions at home: Lifestyle   Do not use any products that contain nicotine or tobacco, such as cigarettes and e-cigarettes. If you need help quitting, ask your health care provider.  Limit your caffeine intake as told by your health care provider.  Try to get 8 hours of sleep each night.  Find ways to manage your stress that fits your lifestyle and personality. Consider trying meditation or yoga.  Try to anticipate stressful situations and allow extra time to manage them.  If you are struggling emotionally with the effects of your tremor, consider working with a mental health provider. General instructions  Take over-the-counter and prescription medicines only as told by your health care provider.  Avoid extreme heat and extreme cold.  Keep all follow-up visits as told by your health care provider. This is important. Visits may include physical therapy visits. Contact a health care provider if:  You experience any changes in the location or intensity of your tremors.  You start having a tremor after starting a new medicine.  You have tremor with other symptoms, such as: ? Numbness. ? Tingling. ? Pain. ? Weakness.  Your tremor gets worse.  Your tremor interferes with your daily life.  You feel down, blue, or sad for at least 2 weeks in a row.  Worrying about your tremor and what other people think about you interferes with your everyday life functions, including relationships, work, or school. Summary  Essential tremor is a tremor without a known cause. Usually, it occurs when you are trying to perform an action.  The cause of this condition is not known.  The main sign of a  tremor is a rhythmic shaking of certain parts of your body that is uncontrolled and unintentional.  Treatment for essential tremor depends on the severity of the condition. This information is not intended to replace advice given to you by your health care provider. Make sure you discuss any questions you have with your health care provider. Document Released: 06/01/2014 Document Revised: 05/21/2017 Document Reviewed: 05/21/2017 Elsevier Interactive Patient Education  2019 Elsevier Inc.  Propranolol extended-release capsules What is this medicine? PROPRANOLOL (proe PRAN oh lole) is a beta-blocker. Beta-blockers reduce the workload on the heart and help it to beat more regularly. This medicine is used to treat high blood pressure, heart muscle disease, and prevent chest pain caused by angina. It is also used to prevent migraine headaches. You should not use this medicine to treat a migraine that has already started. This medicine may be used for other purposes; ask your health care provider or pharmacist if you have questions. COMMON BRAND NAME(S): Inderal LA, Inderal XL, InnoPran XL What should I tell my health care provider before I take this medicine? They need to know if you have any of these conditions: -circulation problems, or blood vessel disease -diabetes -history of heart attack or heart disease, vasospastic angina -kidney disease -liver disease -lung or breathing disease, like asthma or emphysema -pheochromocytoma -slow heart rate -thyroid disease -an unusual or allergic reaction to propranolol, other beta-blockers, medicines, foods, dyes, or preservatives -pregnant or trying to get pregnant -breast-feeding How should I use this medicine? Take this medicine by mouth with a glass of water. Follow the directions on the prescription label. Do not crush or chew. Take your doses at regular intervals. Do not take your medicine more often than directed. Do not stop taking except on the  advice of your doctor or health care professional. Talk to your pediatrician regarding the use of this medicine in children. Special care may be needed. Overdosage: If you think you have taken too much of this medicine contact a poison control center or emergency room at once. NOTE: This medicine is only for you. Do not share this medicine with others. What if I miss a dose? If you miss a dose, take it as soon as you can. If it is almost time for your next dose, take only that dose. Do not take double or extra doses. What may interact with this medicine? Do not take this medicine with any of the following medications: -feverfew -phenothiazines like chlorpromazine, mesoridazine, prochlorperazine, thioridazine This medicine may also interact with the following medications: -aluminum hydroxide gel -antipyrine -antiviral medicines for HIV or AIDS -barbiturates like phenobarbital -certain medicines for blood pressure, heart disease, irregular heart beat -cimetidine -ciprofloxacin -diazepam -fluconazole -haloperidol -isoniazid -medicines for cholesterol like cholestyramine or colestipol -medicines for mental depression -medicines for migraine headache like almotriptan, eletriptan, frovatriptan, naratriptan, rizatriptan, sumatriptan, zolmitriptan -NSAIDs, medicines for pain and inflammation, like ibuprofen or naproxen -phenytoin -rifampin -teniposide -theophylline -thyroid medicines -tolbutamide -warfarin -zileuton This list may not describe all  possible interactions. Give your health care provider a list of all the medicines, herbs, non-prescription drugs, or dietary supplements you use. Also tell them if you smoke, drink alcohol, or use illegal drugs. Some items may interact with your medicine. What should I watch for while using this medicine? Visit your doctor or health care professional for regular check ups. Contact your doctor right away if your symptoms worsen. Check your blood  pressure and pulse rate regularly. Ask your health care professional what your blood pressure and pulse rate should be, and when you should contact them. Do not stop taking this medicine suddenly. This could lead to serious heart-related effects. You may get drowsy or dizzy. Do not drive, use machinery, or do anything that needs mental alertness until you know how this drug affects you. Do not stand or sit up quickly, especially if you are an older patient. This reduces the risk of dizzy or fainting spells. Alcohol can make you more drowsy and dizzy. Avoid alcoholic drinks. This medicine can affect blood sugar levels. If you have diabetes, check with your doctor or health care professional before you change your diet or the dose of your diabetic medicine. Do not treat yourself for coughs, colds, or pain while you are taking this medicine without asking your doctor or health care professional for advice. Some ingredients may increase your blood pressure. What side effects may I notice from receiving this medicine? Side effects that you should report to your doctor or health care professional as soon as possible: -allergic reactions like skin rash, itching or hives, swelling of the face, lips, or tongue -breathing problems -changes in blood sugar -cold hands or feet -difficulty sleeping, nightmares -dry peeling skin -hallucinations -muscle cramps or weakness -slow heart rate -swelling of the legs and ankles -vomiting Side effects that usually do not require medical attention (report to your doctor or health care professional if they continue or are bothersome): -change in sex drive or performance -diarrhea -dry sore eyes -hair loss -nausea -weak or tired This list may not describe all possible side effects. Call your doctor for medical advice about side effects. You may report side effects to FDA at 1-800-FDA-1088. Where should I keep my medicine? Keep out of the reach of children. Store at  room temperature between 15 and 30 degrees C (59 and 86 degrees F). Protect from light, moisture and freezing. Keep container tightly closed. Throw away any unused medicine after the expiration date. NOTE: This sheet is a summary. It may not cover all possible information. If you have questions about this medicine, talk to your doctor, pharmacist, or health care provider.  2019 Elsevier/Gold Standard (2013-01-13 14:58:56)
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
## 2018-09-27 ENCOUNTER — Ambulatory Visit: Payer: Medicare PPO | Admitting: Family Medicine

## 2018-10-17 ENCOUNTER — Other Ambulatory Visit: Payer: Self-pay

## 2018-10-17 ENCOUNTER — Emergency Department
Admission: EM | Admit: 2018-10-17 | Discharge: 2018-10-17 | Disposition: A | Discharge status: Home / Self Care | Payer: 59 | Attending: Emergency Medicine | Admitting: Emergency Medicine

## 2018-10-17 DIAGNOSIS — Z79899 Other long term (current) drug therapy: Secondary | ICD-10-CM | POA: Insufficient documentation

## 2018-10-17 DIAGNOSIS — Z96643 Presence of artificial hip joint, bilateral: Secondary | ICD-10-CM | POA: Insufficient documentation

## 2018-10-17 DIAGNOSIS — M79604 Pain in right leg: Secondary | ICD-10-CM | POA: Insufficient documentation

## 2018-10-17 DIAGNOSIS — F17210 Nicotine dependence, cigarettes, uncomplicated: Secondary | ICD-10-CM | POA: Insufficient documentation

## 2018-10-17 DIAGNOSIS — M25532 Pain in left wrist: Secondary | ICD-10-CM | POA: Insufficient documentation

## 2018-10-17 DIAGNOSIS — F419 Anxiety disorder, unspecified: Secondary | ICD-10-CM | POA: Diagnosis not present

## 2018-10-17 DIAGNOSIS — I10 Essential (primary) hypertension: Secondary | ICD-10-CM | POA: Diagnosis not present

## 2018-10-17 DIAGNOSIS — F329 Major depressive disorder, single episode, unspecified: Secondary | ICD-10-CM | POA: Insufficient documentation
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
## 2018-10-17 NOTE — Discharge Instructions (Addendum)
You may take Tylenol and/or Ibuprofen as needed for discomfort.  Please keep your appointment to see your doctor in the morning for reassessment.  Return to the ER for worsening symptoms, persistent vomiting, difficulty breathing or other concerns.
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
## 2018-10-27 ENCOUNTER — Ambulatory Visit: Payer: 59 | Admitting: Family Medicine
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
## 2019-01-20 ENCOUNTER — Emergency Department (INDEPENDENT_AMBULATORY_CARE_PROVIDER_SITE_OTHER)
Admission: EM | Admit: 2019-01-20 | Discharge: 2019-01-20 | Disposition: A | Discharge status: Home / Self Care | Payer: Medicare PPO | Source: Home / Self Care | Attending: Family Medicine | Admitting: Family Medicine

## 2019-01-20 ENCOUNTER — Encounter: Payer: Self-pay | Admitting: Emergency Medicine

## 2019-01-20 ENCOUNTER — Other Ambulatory Visit: Payer: Self-pay

## 2019-01-20 DIAGNOSIS — G250 Essential tremor: Secondary | ICD-10-CM | POA: Diagnosis not present

## 2019-01-20 MED ORDER — PRIMIDONE 50 MG PO TABS: 50.0000 mg | ORAL_TABLET | Freq: Every day | ORAL | 1 refills | Status: DC

## 2019-01-20 NOTE — Discharge Instructions (Addendum)
Read enclosed information on Essential Tremor

## 2019-01-20 NOTE — ED Provider Notes (Signed)
[name]    CSN: [id number] Arrival date & time: 01/20/19  1656      History   Chief Complaint Chief Complaint  Patient presents with  . Tremors    HPI Jesse [name] is a 63 y.o. male.   Patient complains of approximately one year history of intermittent bilateral tremor in his hands.  He had visited Dr. Denyse Amassorey about 4 months ago who prescribed propranolol LA 60mg , one daily.  Patient reports that he had no improvement.  He states that his tremor is still present, but not worse.  [name] left hand dominant and states that the tremor is most bothersome when he tries to write.  He reports no other neurologic symptoms.  The history is provided by the patient.    Past Medical History:  Diagnosis Date  . Hyperlipidemia   . Hyperlipidemia 02/15/2014  . Hypertension   . Pulmonary embolism (HCC)   . Sciatic nerve pain   . Tremors of nervous system     Patient Active Problem List   Diagnosis Date Noted  . Essential tremor 08/30/2018  . Trochanteric bursitis of left hip 08/30/2018  . 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 History of pulmonary embolism 09/09/2015  . Pulmonary nodule, left 09/09/2015  . Anxiety 02/15/2014  . 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 Depression 02/15/2014  . Family history of diabetes mellitus in mother 02/15/2014  . 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 Hyperlipidemia 02/15/2014  . Sleep disturbance 02/15/2014    Past Surgical History:  Procedure Laterality Date  . HIP SURGERY Bilateral   . TOTAL HIP ARTHROPLASTY Bilateral        Home Medications    Prior to Admission medications   Medication Sig Start Date End Date Taking? Authorizing Provider  pravastatin (PRAVACHOL) 40 MG tablet Take 1 tablet (40 mg total) by mouth daily. For cholesterol 08/30/18   Rodolph Bongorey, Evan S, MD  primidone (MYSOLINE) 50 MG tablet Take 1 tablet (50 mg total) by mouth at bedtime. 01/20/19   Lattie HawBeese, Stephen A, MD  propranolol ER (INDERAL  LA) 60 MG 24 hr capsule Take 1 capsule (60 mg total) by mouth daily. For tremor 08/30/18 01/20/19  Rodolph Bongorey, Evan S, MD    Family History Family History  Problem Relation Age of Onset  . Diabetes Mother   . Diabetes Father     Social History Social History   Tobacco Use  . Smoking status: Current Some Day Smoker  . Smokeless tobacco: Never Used  . Tobacco comment: trying to quit smoking, smokes 3 cigs q mth, uses vapes daily.   Substance Use Topics  . Alcohol use: No  . Drug use: No     Allergies   Penicillins   Review of Systems Review of Systems  Constitutional: Negative for activity change, fatigue, fever and unexpected weight change.  HENT: Negative.   Eyes: Negative.   Respiratory: Negative.   Cardiovascular: Negative.   Gastrointestinal: Negative.   Musculoskeletal: Negative.   Skin: Negative.   Neurological: Positive for tremors. Negative for dizziness, seizures, syncope, facial asymmetry, speech difficulty, weakness, light-headedness, numbness and headaches.     Physical Exam Triage Vital Signs ED Triage Vitals  Enc Vitals Group     BP 01/20/19 1721 (!) 152/98     Pulse Rate 01/20/19 1721 64     Resp --      Temp 01/20/19 1721 98.4 F (36.9 C)     Temp Source 01/20/19 1721 Oral  SpO2 01/20/19 1721 98 %     Weight 01/20/19 1722 215 lb (97.5 kg)     Height 01/20/19 1722 5\' 11"  (1.803 m)     Head Circumference --      Peak Flow --      Pain Score 01/20/19 1722 0     Pain Loc --      Pain Edu? --      Excl. in [place]? --    No data found.  Updated Vital Signs BP (!) 152/98 (BP Location: Right Arm)   Pulse 64   Temp 98.4 F (36.9 C) (Oral)   Ht 5\' 11"  (1.803 m)   Wt 97.5 kg   SpO2 98%   BMI 29.99 kg/m   Visual Acuity Right Eye Distance:   Left Eye Distance:   Bilateral Distance:    Right Eye Near:   Left Eye Near:    Bilateral Near:     Physical Exam Vitals signs and nursing note reviewed.  Constitutional:      General: [name] not in  acute distress. HENT:     Head: Normocephalic.     Nose: Nose normal.     Mouth/Throat:     Mouth: Mucous membranes are moist.  Eyes:     Extraocular Movements: Extraocular movements intact.     Pupils: Pupils are equal, round, and reactive to light.  Neck:     Musculoskeletal: Normal range of motion.  Cardiovascular:     Rate and Rhythm: Normal rate.  Pulmonary:     Effort: Pulmonary effort is normal.  Skin:    General: Skin is warm and dry.  Neurological:     Mental Status: [name] alert.     Motor: Tremor present.     Comments: Patient has a tremor of both hands/fingers.  Strength and sensation intact.      UC Treatments / Results  Labs (all labs ordered are listed, but only abnormal results are displayed) Labs Reviewed - No data to display  EKG   Radiology No results found.  Procedures Procedures (including critical care time)  Medications Ordered in UC Medications - No data to display  Initial Impression / Assessment and Plan / UC Course  I have reviewed the triage vital signs and the nursing notes.  Pertinent labs & imaging results that were available during my care of the patient were reviewed by me and considered in my medical decision making (see chart for details).    Patient has had no change in his tremor, and had no response to propranolol. Begin trial of primidone at starting dose of 50mg  QHS.  Will most likely need to titrate to a higher dose. Followup with Dr. Lynne Leader ([facility]) in one week.  Final Clinical Impressions(s) / UC Diagnoses   Final diagnoses:  Benign essential tremor     Discharge Instructions     Read enclosed information on Essential Tremor    ED Prescriptions    Medication Sig Dispense Auth. Provider   primidone (MYSOLINE) 50 MG tablet Take 1 tablet (50 mg total) by mouth at bedtime. 15 tablet Kandra Nicolas, MD         Kandra Nicolas, MD 01/20/19 [phone]

## 2019-01-20 NOTE — ED Triage Notes (Addendum)
Tremors in both hands for about one year, getting worse. Saw someone for it took a medication but he stopped taking it because he didn't think it was helping

## 2019-03-17 ENCOUNTER — Telehealth: Payer: Self-pay | Admitting: Family Medicine

## 2019-03-17 NOTE — Telephone Encounter (Signed)
Pt called clinic to question if the Primidone Rx could be increased. Pt states on the 50mg  dose he is able to write some, but it does require some extra effort. Wonders if the increase would help. Upon review from last OV, he is due for a follow up. Scheduled for next week.

## 2019-03-20 ENCOUNTER — Ambulatory Visit: Payer: Medicare PPO | Admitting: Family Medicine

## 2019-03-20 NOTE — Progress Notes (Deleted)
       Jesse [name] is a 63 y.o. male who presents to [facility] today for ***  Follow-up essential tremor.  Patient was seen in April for what was thought to be essential tremor.  Patient had trial of propranolol.  This did not help and he was seen in urgent care on August 28 and he was started on primidone 50 mg nightly with plan to titrate on follow-up with PCP. ***  Additionally he has a history of hypertension and was advised to follow-up for blood pressure recheck in April and was somewhat lost to follow-up. *** ROS as above:  Exam:  There were no vitals taken for this visit. Wt Readings from Last 5 Encounters:  01/20/19 215 lb (97.5 kg)  10/17/18 205 lb (93 kg)  08/30/18 213 lb (96.6 kg)  06/13/18 215 lb (97.5 kg)  11/05/17 215 lb (97.5 kg)    Gen: Well NAD HEENT: EOMI,  MMM Lungs: Normal work of breathing. CTABL Heart: RRR no MRG Abd: NABS, Soft. Nondistended, Nontender Exts: Brisk capillary refill, warm and well perfused.   Lab and Radiology Results No results found for this or any previous visit (from the past 72 hour(s)). No results found.    Assessment and Plan: 63 y.o. male with ***  PDMP not reviewed this encounter. No orders of the defined types were placed in this encounter.  No orders of the defined types were placed in this encounter.    Historical information moved to improve visibility of documentation.  Past Medical History:  Diagnosis Date  . Hyperlipidemia   . Hyperlipidemia 02/15/2014  . Hypertension   . Pulmonary embolism ([place])   . Sciatic nerve pain   . Tremors of nervous system    Past Surgical History:  Procedure Laterality Date  . HIP SURGERY Bilateral   . TOTAL HIP ARTHROPLASTY Bilateral    Social History   Tobacco Use  . Smoking status: Current Some Day Smoker  . Smokeless tobacco: Never Used  . Tobacco  comment: trying to quit smoking, smokes 3 cigs q mth, uses vapes daily.   Substance Use Topics  . Alcohol use: No   family history includes Diabetes in his father and mother.  Medications: Current Outpatient Medications  Medication Sig Dispense Refill  . pravastatin (PRAVACHOL) 40 MG tablet Take 1 tablet (40 mg total) by mouth daily. For cholesterol 90 tablet 1  . primidone (MYSOLINE) 50 MG tablet Take 1 tablet (50 mg total) by mouth at bedtime. 15 tablet 1   No current facility-administered medications for this visit.    Allergies  Allergen Reactions  . Penicillins      Discussed warning signs or symptoms. Please see discharge instructions. Patient expresses understanding.

## 2019-05-04 ENCOUNTER — Other Ambulatory Visit: Payer: Self-pay

## 2019-05-04 ENCOUNTER — Ambulatory Visit (INDEPENDENT_AMBULATORY_CARE_PROVIDER_SITE_OTHER): Payer: Medicare PPO | Admitting: Osteopathic Medicine

## 2019-05-04 ENCOUNTER — Encounter: Payer: Self-pay | Admitting: Physician Assistant

## 2019-05-04 VITALS — BP 161/105 | HR 75 | Temp 98.5°F | Wt 224.0 lb

## 2019-05-04 DIAGNOSIS — E782 Mixed hyperlipidemia: Secondary | ICD-10-CM

## 2019-05-04 DIAGNOSIS — Z0000 Encounter for general adult medical examination without abnormal findings: Secondary | ICD-10-CM | POA: Diagnosis not present

## 2019-05-04 DIAGNOSIS — Z23 Encounter for immunization: Secondary | ICD-10-CM | POA: Diagnosis not present

## 2019-05-04 DIAGNOSIS — G250 Essential tremor: Secondary | ICD-10-CM | POA: Diagnosis not present

## 2019-05-04 MED ORDER — PRIMIDONE 50 MG PO TABS: 50.0000 mg | ORAL_TABLET | Freq: Every day | ORAL | 3 refills | Status: DC

## 2019-05-04 NOTE — Patient Instructions (Signed)
Restart Primidone nightly. Please call the office if your symptoms do not improve or worsen.  Lab orders placed so go swing by the lab within the next two weeks.  Schedule for annual physical exam and review of labs in 4 weeks.

## 2019-05-04 NOTE — Progress Notes (Addendum)
Established Patient Office Visit  Subjective:  Patient ID: Jesse [name], male    DOB: 06-27-1955  Age: 63 y.o. MRN: [id number]  CC:  Chief Complaint  Patient presents with  . Tremors    HPI Jesse [name] presents for reevaluation of left hand tremor causing difficulty with writing. Tremors noted to be more frequent and worsening. Has not taken Primidone in 5 weeks since he ran out. Wants to restart Primidone. Not currently taking Propranolol.  Due for annual physical exam with labs.  Past Medical History:  Diagnosis Date  . Hyperlipidemia   . Hyperlipidemia 02/15/2014  . Hypertension   . Pulmonary embolism (HCC)   . Sciatic nerve pain   . Tremors of nervous system     Past Surgical History:  Procedure Laterality Date  . HIP SURGERY Bilateral   . TOTAL HIP ARTHROPLASTY Bilateral     Family History  Problem Relation Age of Onset  . Diabetes Mother   . Diabetes Father     Social History   Socioeconomic History  . Marital status: Divorced    Spouse name: Not on file  . Number of children: Not on file  . Years of education: Not on file  . Highest education level: Not on file  Occupational History  . Not on file  Tobacco Use  . Smoking status: Current Some Day Smoker  . Smokeless tobacco: Never Used  . Tobacco comment: trying to quit smoking, smokes 3 cigs q mth, uses vapes daily.   Substance and Sexual Activity  . Alcohol use: No  . Drug use: No  . Sexual activity: Not on file  Other Topics Concern  . Not on file  Social History Narrative  . Not on file   Social Determinants of Health   Financial Resource Strain:   . Difficulty of Paying Living Expenses: Not on file  Food Insecurity:   . Worried About Programme researcher, broadcasting/film/video in the Last Year: Not on file  . Ran Out of Food in the Last Year: Not on file  Transportation Needs:   . Lack of Transportation (Medical): Not on file  . Lack of Transportation (Non-Medical): Not on file  Physical Activity:   .  Days of Exercise per Week: Not on file  . Minutes of Exercise per Session: Not on file  Stress:   . Feeling of Stress : Not on file  Social Connections:   . Frequency of Communication with Friends and Family: Not on file  . Frequency of Social Gatherings with Friends and Family: Not on file  . Attends Religious Services: Not on file  . Active Member of Clubs or Organizations: Not on file  . Attends Banker Meetings: Not on file  . Marital Status: Not on file  Intimate Partner Violence:   . Fear of Current or Ex-Partner: Not on file  . Emotionally Abused: Not on file  . Physically Abused: Not on file  . Sexually Abused: Not on file    Outpatient Medications Prior to Visit  Medication Sig Dispense Refill  . pravastatin (PRAVACHOL) 40 MG tablet Take 1 tablet (40 mg total) by mouth daily. For cholesterol 90 tablet 1  . primidone (MYSOLINE) 50 MG tablet Take 1 tablet (50 mg total) by mouth at bedtime. 15 tablet 1   No facility-administered medications prior to visit.    Allergies  Allergen Reactions  . Penicillins     ROS Review of Systems  Musculoskeletal: Negative for myalgias.  Neurological: Positive  for tremors (left hand). Negative for weakness and numbness.      Objective:    Physical Exam  Constitutional: [name] oriented to person, place, and time. He appears well-developed and well-nourished.  HENT:  Head: Normocephalic and atraumatic.  Neurological: [name] alert and oriented to person, place, and time.  Mild left hand tremor at rest that worsens with intentional movement.  Skin: Skin is warm and dry.    BP (!) 161/105   Pulse 75   Temp 98.5 F (36.9 C) (Oral)   Wt 224 lb (101.6 kg)   BMI 31.24 kg/m  Wt Readings from Last 3 Encounters:  05/04/19 224 lb (101.6 kg)  01/20/19 215 lb (97.5 kg)  10/17/18 205 lb (93 kg)     Health Maintenance Due  Topic Date Due  . Hepatitis C Screening  10-22-1955  . HIV Screening  02/16/1972  . TETANUS/TDAP   02/16/1976  . COLONOSCOPY  02/16/2007  . INFLUENZA VACCINE  12/24/2018      Assessment & Plan:   Essential tremor: Restart Primidone 50mg  q HS.  Need for annual physical exam: Lab orders entered. Please have labs drawn in the next two weeks. Schedule annual physical exam in 3-4 weeks.   Problem List Items Addressed This Visit      Nervous and Auditory   Essential tremor   Relevant Orders   CBC with Differential   COMPLETE METABOLIC PANEL WITH GFR   Lipid panel   PSA     Other   Hyperlipidemia   Relevant Orders   CBC with Differential   COMPLETE METABOLIC PANEL WITH GFR   Lipid panel   PSA    Other Visit Diagnoses    Need for immunization against influenza    -  Primary   Relevant Orders   Flu Vaccine QUAD 6+ mos PF IM (Fluarix Quad PF)   CBC with Differential   COMPLETE METABOLIC PANEL WITH GFR   Lipid panel   PSA   Annual physical exam       Relevant Orders   CBC with Differential   COMPLETE METABOLIC PANEL WITH GFR   Lipid panel   PSA      Meds ordered this encounter  Medications  . primidone (MYSOLINE) 50 MG tablet    Sig: Take 1 tablet (50 mg total) by mouth at bedtime.    Dispense:  90 tablet    Refill:  3    Order Specific Question:   Supervising Provider    Answer:   Silverio Decamp [[id number]]    Follow-up: Return in about 4 weeks (around 06/01/2019) for Annual physical exam.    Samuel Bouche, NP

## 2019-05-09 NOTE — Addendum Note (Signed)
Addended bySamuel Bouche on: 05/09/2019 08:19 AM   Modules accepted: Orders

## 2019-07-30 ENCOUNTER — Other Ambulatory Visit: Payer: Self-pay | Admitting: Family Medicine

## 2019-11-15 ENCOUNTER — Emergency Department (INDEPENDENT_AMBULATORY_CARE_PROVIDER_SITE_OTHER)
Admission: EM | Admit: 2019-11-15 | Discharge: 2019-11-15 | Disposition: A | Discharge status: Home / Self Care | Payer: Medicare PPO | Source: Home / Self Care | Attending: Family Medicine | Admitting: Family Medicine

## 2019-11-15 DIAGNOSIS — I10 Essential (primary) hypertension: Secondary | ICD-10-CM | POA: Diagnosis not present

## 2019-11-15 LAB — POCT URINALYSIS DIP (MANUAL ENTRY)
Bilirubin, UA: NEGATIVE
Blood, UA: NEGATIVE — AB
Glucose, UA: NEGATIVE mg/dL
Ketones, POC UA: NEGATIVE mg/dL
Leukocytes, UA: NEGATIVE
Nitrite, UA: NEGATIVE
Protein Ur, POC: NEGATIVE mg/dL
Spec Grav, UA: 1.025 (ref 1.010–1.025)
Urobilinogen, UA: 0.2 E.U./dL
pH, UA: 5.5 (ref 5.0–8.0)

## 2019-11-15 LAB — POCT CBC W AUTO DIFF (K'VILLE URGENT CARE)

## 2019-11-15 MED ORDER — AMLODIPINE BESYLATE 5 MG PO TABS
ORAL_TABLET | ORAL | 0 refills | Status: DC
Start: 2019-11-15 — End: 2023-09-15

## 2019-11-15 NOTE — ED Triage Notes (Signed)
Has had High BPx 2 years.  Was taking BP medication on a temporary bases.  Last medication was 2019.  Has been having dizziness and fatigue for about 2 weeks

## 2019-11-15 NOTE — Discharge Instructions (Addendum)
Please monitor your blood pressure several times weekly, at different times of the day, record on a calendar with times of measurement and take this record to your Family Doctor.  

## 2019-11-15 NOTE — ED Provider Notes (Signed)
[facility]    CSN: [id number] Arrival date & time: 11/15/19  1555      History   Chief Complaint Chief Complaint  Patient presents with  . Hypertension  . Dizziness  . Fatigue    HPI Jesse [name] is a 64 y.o. male.   Patient reports that during an employment physical exam two days ago he was informed that his BP was too high and he was advised to seek treatment.  He reports that he had been taking BP medication (Inderal) intermittently during the past two years, but ran out of medication. He reports that recently his vision has been "cloudy" and he has been vaguely dizzy at times.  He complains of recent decrease in exercise tolerance and feeling shortness of breath with activity.  He denies chest pain, palpitations, headache, leg cramps, lower leg edema. He continues to smoke.  The history is provided by the patient.    Past Medical History:  Diagnosis Date  .    Marland Kitchen Hyperlipidemia 02/15/2014  . Hypertension   . Pulmonary embolism (HCC)   . Sciatic nerve pain   . Tremors of nervous system     Patient Active Problem List   Diagnosis Date Noted  . Essential tremor 08/30/2018  . Trochanteric bursitis of left hip 08/30/2018  . 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 History of pulmonary embolism 09/09/2015  . Pulmonary nodule, left 09/09/2015  . Anxiety 02/15/2014  . 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 Depression 02/15/2014  . Family history of diabetes mellitus in mother 02/15/2014  . 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 Hyperlipidemia 02/15/2014  . Sleep disturbance 02/15/2014    Past Surgical History:  Procedure Laterality Date  . HIP SURGERY Bilateral   . TOTAL HIP ARTHROPLASTY Bilateral        Home Medications    Prior to Admission medications   Medication Sig Start Date End Date Taking? Authorizing Provider  amLODipine (NORVASC) 5 MG tablet Take one tab PO daily for blood pressure 11/15/19   Lattie Haw, MD    pravastatin (PRAVACHOL) 40 MG tablet TAKE 1 TABLET BY MOUTH EVERY DAY FOR CHOLESTEROL 07/31/19   Sunnie Nielsen, DO  primidone (MYSOLINE) 50 MG tablet Take 1 tablet (50 mg total) by mouth at bedtime. 05/04/19   Christen Butter, NP  propranolol ER (INDERAL LA) 60 MG 24 hr capsule Take 1 capsule (60 mg total) by mouth daily. For tremor 08/30/18 01/20/19  Rodolph Bong, MD    Family History Family History  Problem Relation Age of Onset  . Diabetes Mother   . Diabetes Father     Social History Social History   Tobacco Use  . Smoking status: Current Some Day Smoker  . Smokeless tobacco: Never Used  . Tobacco comment: trying to quit smoking, smokes 3 cigs q mth, uses vapes daily.   Vaping Use  . Vaping Use: Never used  Substance Use Topics  . Alcohol use: No  . Drug use: No     Allergies   Penicillins   Review of Systems Review of Systems  Constitutional: Negative for activity change, appetite change, chills, diaphoresis, fatigue, fever and unexpected weight change.  HENT: Negative.   Eyes: Positive for visual disturbance.  Respiratory: Positive for shortness of breath. Negative for cough, chest tightness, wheezing and stridor.   Cardiovascular: Negative for chest pain, palpitations and leg swelling.  Gastrointestinal: Negative.   Endocrine: Negative.   Genitourinary: Negative.   Musculoskeletal: Negative.  Skin: Negative.   Neurological: Negative.   Psychiatric/Behavioral: Negative.      Physical Exam Triage Vital Signs ED Triage Vitals  Enc Vitals Group     BP 11/15/19 1625 (!) 148/102     Pulse Rate 11/15/19 1625 72     Resp 11/15/19 1625 20     Temp 11/15/19 1625 98.3 F (36.8 C)     Temp Source 11/15/19 1625 Oral     SpO2 11/15/19 1625 99 %     Weight 11/15/19 1627 212 lb (96.2 kg)     Height 11/15/19 1627 5\' 11"  (1.803 m)     Head Circumference --      Peak Flow --      Pain Score 11/15/19 1627 0     Pain Loc --      Pain Edu? --      Excl. in [place]? --     No data found.  Updated Vital Signs BP (!) 148/102 (BP Location: Right Arm)   Pulse 72   Temp 98.3 F (36.8 C) (Oral)   Resp 20   Ht 5\' 11"  (1.803 m)   Wt 96.2 kg   SpO2 99%   BMI 29.57 kg/m   Visual Acuity Right Eye Distance:   Left Eye Distance:   Bilateral Distance:    Right Eye Near:   Left Eye Near:    Bilateral Near:     Physical Exam Nursing notes and Vital Signs reviewed. Appearance:  Patient appears stated age, and in no acute distress Eyes:  Pupils are equal, round, and reactive to light and accomodation.  Extraocular movement is intact.  Conjunctivae are not inflamed.  Arcus senilis present. Ears:  Canals normal.  Tympanic membranes normal.  Nose:   Normal turbinates.  No sinus tenderness. Pharynx:  Normal Neck:  Supple.  No adenopathy or thyromegaly.  Carotids have normal upstrokes without bruits.  Lungs:  Clear to auscultation.  Breath sounds are equal.  Moving air well. Heart:  Regular rate and rhythm without murmurs, rubs, or gallops.  Abdomen:  Nontender without masses or hepatosplenomegaly.  Bowel sounds are present.  No CVA or flank tenderness.  Extremities:  No edema.  Pulses intact. Skin:  No rash present.   UC Treatments / Results  Labs (all labs ordered are listed, but only abnormal results are displayed) Labs Reviewed  POCT URINALYSIS DIP (MANUAL ENTRY) - Abnormal; Notable for the following components:      Result Value   Blood, UA trace-intact (*)    All other components within normal limits  COMPLETE METABOLIC PANEL WITH GFR  POCT CBC W AUTO DIFF ([facility]):  WBC 6.0; LY 39.6; MO 7.2; GR 53.2; Hgb 13.6; Platelets 222     EKG  Rate:  61 BPM PR:  172 msec QT:  438 msec QTcH:  440 msec QRSD:  96 msec QRS axis:  21 degrees Interpretation:   Incomplete right  bundle branch block; possible left atrial enlargement; normal sinus rhythm   Radiology No results found.  Procedures Procedures (including critical care  time)  Medications Ordered in UC Medications - No data to display  Initial Impression / Assessment and Plan / UC Course  I have reviewed the triage vital signs and the nursing notes.  Pertinent labs & imaging results that were available during my care of the patient were reviewed by me and considered in my medical decision making (see chart for details).    CMP pending. Begin amlodipine 5mg  daily  starting dose. Advised to quit smoking. Followup with Family Doctor in one week.   Final Clinical Impressions(s) / UC Diagnoses   Final diagnoses:  Essential hypertension     Discharge Instructions     Please monitor your blood pressure several times weekly, at different times of the day, record on a calendar with times of measurement and take this record to your Family Doctor.     ED Prescriptions    Medication Sig Dispense Auth. Provider   amLODipine (NORVASC) 5 MG tablet Take one tab PO daily for blood pressure 30 tablet Lattie Haw, MD        Lattie Haw, MD 11/19/19 [phone]

## 2019-11-16 LAB — COMPLETE METABOLIC PANEL WITH GFR
AG Ratio: 1.4 (calc) (ref 1.0–2.5)
ALT: 10 U/L (ref 9–46)
AST: 15 U/L (ref 10–35)
Albumin: 4 g/dL (ref 3.6–5.1)
Alkaline phosphatase (APISO): 58 U/L (ref 35–144)
BUN: 10 mg/dL (ref 7–25)
CO2: 20 mmol/L (ref 20–32)
Calcium: 9.3 mg/dL (ref 8.6–10.3)
Chloride: 110 mmol/L (ref 98–110)
Creat: 1.07 mg/dL (ref 0.70–1.25)
GFR, Est African American: 86 mL/min/{1.73_m2} (ref >=60)
GFR, Est Non African American: 74 mL/min/{1.73_m2} (ref >=60)
Globulin: 2.9 g/dL (calc) (ref 1.9–3.7)
Glucose, Bld: 103 mg/dL — ABNORMAL HIGH (ref 65–99)
Potassium: 4 mmol/L (ref 3.5–5.3)
Sodium: 139 mmol/L (ref 135–146)
Total Bilirubin: 0.4 mg/dL (ref 0.2–1.2)
Total Protein: 6.9 g/dL (ref 6.1–8.1)

## 2019-12-13 ENCOUNTER — Emergency Department (INDEPENDENT_AMBULATORY_CARE_PROVIDER_SITE_OTHER)
Admission: EM | Admit: 2019-12-13 | Discharge: 2019-12-13 | Disposition: A | Discharge status: Home / Self Care | Payer: Medicare PPO | Source: Home / Self Care | Attending: Family Medicine | Admitting: Family Medicine

## 2019-12-13 ENCOUNTER — Other Ambulatory Visit: Payer: Self-pay

## 2019-12-13 ENCOUNTER — Encounter: Payer: Self-pay | Admitting: Emergency Medicine

## 2019-12-13 DIAGNOSIS — Z20822 Contact with and (suspected) exposure to covid-19: Secondary | ICD-10-CM | POA: Diagnosis not present

## 2019-12-13 DIAGNOSIS — J40 Bronchitis, not specified as acute or chronic: Secondary | ICD-10-CM

## 2019-12-13 MED ORDER — AZITHROMYCIN 250 MG PO TABS
ORAL_TABLET | ORAL | 0 refills | Status: DC
Start: 2019-12-13 — End: 2023-09-15

## 2019-12-13 NOTE — Discharge Instructions (Addendum)
Take plain guaifenesin (1200mg  extended release tabs such as Mucinex) twice daily, with plenty of water, for cough and congestion. Get adequate rest.   May take Delsym Cough Suppressant ("12 Hour Cough Relief") at bedtime for nighttime cough.  Stop all antihistamines for now, and other non-prescription cough/cold preparations.   Isolate yourself until COVID-19 test result is available.   If your COVID19 test is positive, then you are infected with the novel coronavirus and could give the virus to others.  Please continue isolation at home for at least 10 days since the start of your symptoms.  Once you complete your 10 day quarantine, you may return to normal activities as long as you've not had a fever for over 24 hours (without taking fever reducing medicine) and your symptoms are improving. Please continue good preventive care measures, including:  frequent hand-washing, avoid touching your face, cover coughs/sneezes, stay out of crowds and keep a 6 foot distance from others.  Go to the nearest hospital emergency room if fever/cough/breathlessness are severe or illness seems like a threat to life.

## 2019-12-13 NOTE — ED Provider Notes (Signed)
[facility]    CSN: [id number] Arrival date & time: 12/13/19  1419      History   Chief Complaint Chief Complaint  Patient presents with  . Cough  . Shortness of Breath  . Covid Exposure    HPI Jesse [name] is a 64 y.o. male.   Patient complains of developing a cold-like illness about 10 days ago.  He initially had cough, sore throat, nasal congestion, myalgias, and fatigue.  He feels better but complains of a persistent cough.  He denies chest tightness, shortness of breath, and changes in taste/smell.   The history is provided by the patient.    Past Medical History:  Diagnosis Date  . Hyperlipidemia   . Hyperlipidemia 02/15/2014  . Hypertension   . Pulmonary embolism (HCC)   . Sciatic nerve pain   . Tremors of nervous system     Patient Active Problem List   Diagnosis Date Noted  . Essential tremor 08/30/2018  . Trochanteric bursitis of left hip 08/30/2018  . 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 History of pulmonary embolism 09/09/2015  . Pulmonary nodule, left 09/09/2015  . Anxiety 02/15/2014  . 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 Depression 02/15/2014  . Family history of diabetes mellitus in mother 02/15/2014  . 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 Hyperlipidemia 02/15/2014  . Sleep disturbance 02/15/2014    Past Surgical History:  Procedure Laterality Date  . HIP SURGERY Bilateral   . TOTAL HIP ARTHROPLASTY Bilateral        Home Medications    Prior to Admission medications   Medication Sig Start Date End Date Taking? Authorizing Provider  amLODipine (NORVASC) 5 MG tablet Take one tab PO daily for blood pressure 11/15/19  Yes Mekiah Wahler, Tera Mater, MD  pravastatin (PRAVACHOL) 40 MG tablet TAKE 1 TABLET BY MOUTH EVERY DAY FOR CHOLESTEROL 07/31/19  Yes Sunnie Nielsen, DO  azithromycin (ZITHROMAX Z-PAK) 250 MG tablet Take 2 tabs today; then begin one tab once daily for 4 more days. 12/13/19   Lattie Haw, MD    primidone (MYSOLINE) 50 MG tablet Take 1 tablet (50 mg total) by mouth at bedtime. 05/04/19   Christen Butter, NP  propranolol ER (INDERAL LA) 60 MG 24 hr capsule Take 1 capsule (60 mg total) by mouth daily. For tremor 08/30/18 01/20/19  Rodolph Bong, MD    Family History Family History  Problem Relation Age of Onset  . Diabetes Mother   . Diabetes Father   . Healthy Sister   . Lung disease Brother   . Healthy Sister   . Healthy Sister   . Healthy Brother   . Healthy Brother     Social History Social History   Tobacco Use  . Smoking status: Current Some Day Smoker    Years: 20.00  . Smokeless tobacco: Never Used  . Tobacco comment: trying to quit smoking, smokes 3 cigs q mth, uses vapes daily.   Vaping Use  . Vaping Use: Never used  Substance Use Topics  . Alcohol use: No  . Drug use: No     Allergies   Penicillins   Review of Systems Review of Systems  + sore throat, resolved + cough No pleuritic pain No wheezing + nasal congestion + post-nasal drainage No sinus pain/pressure No itchy/red eyes No earache No hemoptysis No SOB No fever/chills No nausea No vomiting No abdominal pain No diarrhea No urinary symptoms No skin rash + fatigue + myalgias + headache  Physical Exam Triage Vital Signs ED Triage Vitals  Enc Vitals Group     BP 12/13/19 1437 (!) 140/101     Pulse Rate 12/13/19 1437 85     Resp 12/13/19 1437 16     Temp 12/13/19 1437 98.5 F (36.9 C)     Temp Source 12/13/19 1437 Oral     SpO2 12/13/19 1437 100 %     Weight 12/13/19 1446 212 lb 1.3 oz (96.2 kg)     Height 12/13/19 1446 5\' 11"  (1.803 m)     Head Circumference --      Peak Flow --      Pain Score --      Pain Loc --      Pain Edu? --      Excl. in [place]? --    No data found.  Updated Vital Signs BP (!) 140/101 (BP Location: Right Arm)   Pulse 85   Temp 98.5 F (36.9 C) (Oral)   Resp 16   Ht 5\' 11"  (1.803 m)   Wt 96.2 kg   SpO2 100%   BMI 29.58 kg/m   Visual  Acuity Right Eye Distance:   Left Eye Distance:   Bilateral Distance:    Right Eye Near:   Left Eye Near:    Bilateral Near:     Physical Exam Nursing notes and Vital Signs reviewed. Appearance:  Patient appears stated age, and in no acute distress Eyes:  Pupils are equal, round, and reactive to light and accomodation.  Extraocular movement is intact.  Conjunctivae are not inflamed  Ears:  Canals normal.  Tympanic membranes normal.  Nose:  Mildly congested turbinates.  No sinus tenderness.  Pharynx:  Normal Neck:  Supple.  No adenopathy  Lungs:  Clear to auscultation.  Breath sounds are equal.  Moving air well. Heart:  Regular rate and rhythm without murmurs, rubs, or gallops.  Abdomen:  Nontender without masses or hepatosplenomegaly.  Bowel sounds are present.  No CVA or flank tenderness.  Extremities:  No edema.  Skin:  No rash present.   UC Treatments / Results  Labs (all labs ordered are listed, but only abnormal results are displayed) Labs Reviewed  SARS-COV-2 RNA,(COVID-19) QUALITATIVE NAAT    EKG   Radiology No results found.  Procedures Procedures (including critical care time)  Medications Ordered in UC Medications - No data to display  Initial Impression / Assessment and Plan / UC Course  I have reviewed the triage vital signs and the nursing notes.  Pertinent labs & imaging results that were available during my care of the patient were reviewed by me and considered in my medical decision making (see chart for details).    Begin Z-pak. COVID19 send out test. Followup with Family Doctor if not improved in one week.    Final Clinical Impressions(s) / UC Diagnoses   Final diagnoses:  Close exposure to COVID-19 virus  Bronchitis     Discharge Instructions     Take plain guaifenesin (1200mg  extended release tabs such as Mucinex) twice daily, with plenty of water, for cough and congestion. Get adequate rest.   May take Delsym Cough Suppressant ("12  Hour Cough Relief") at bedtime for nighttime cough.  Stop all antihistamines for now, and other non-prescription cough/cold preparations.   Isolate yourself until COVID-19 test result is available.   If your COVID19 test is positive, then you are infected with the novel coronavirus and could give the virus to others.  Please continue isolation  at home for at least 10 days since the start of your symptoms.  Once you complete your 10 day quarantine, you may return to normal activities as long as you've not had a fever for over 24 hours (without taking fever reducing medicine) and your symptoms are improving. Please continue good preventive care measures, including:  frequent hand-washing, avoid touching your face, cover coughs/sneezes, stay out of crowds and keep a 6 foot distance from others.  Go to the nearest hospital emergency room if fever/cough/breathlessness are severe or illness seems like a threat to life.     ED Prescriptions    Medication Sig Dispense Auth. Provider   azithromycin (ZITHROMAX Z-PAK) 250 MG tablet Take 2 tabs today; then begin one tab once daily for 4 more days. 6 tablet Lattie Haw, MD        Lattie Haw, MD 12/15/19 [phone]

## 2019-12-13 NOTE — ED Triage Notes (Signed)
Pt c/o cough w/ body aches for 10 days No COVID vaccine - pt worried about possible COVID exposure No fever or chills per pt No OTC meds Pt does not have a pcp & only has 4 BP meds available

## 2019-12-14 LAB — SARS-COV-2 RNA,(COVID-19) QUALITATIVE NAAT: SARS CoV2 RNA: NOT DETECTED

## 2020-01-23 ENCOUNTER — Ambulatory Visit: Payer: Medicare PPO | Admitting: Family Medicine

## 2022-01-01 DIAGNOSIS — I2510 Atherosclerotic heart disease of native coronary artery without angina pectoris: Secondary | ICD-10-CM | POA: Diagnosis present

## 2022-01-01 DIAGNOSIS — I252 Old myocardial infarction: Secondary | ICD-10-CM

## 2022-08-25 ENCOUNTER — Ambulatory Visit: Payer: Medicare PPO | Admitting: Family Medicine

## 2022-08-25 NOTE — Progress Notes (Deleted)
     Established patient visit   Patient: Jesse [name]   DOB: 1955-12-18   67 y.o. Male  MRN: [id number] Visit Date: 08/25/2022  Today's healthcare provider: Owens Loffler, DO   No chief complaint on file.   SUBJECTIVE   No chief complaint on file.  HPI  Pt presents to establish care.  Review of Systems     No outpatient medications have been marked as taking for the 08/25/22 encounter (Appointment) with Owens Loffler, DO.    OBJECTIVE    There were no vitals taken for this visit.  Physical Exam   {Show previous labs (optional):23736}    ASSESSMENT & PLAN    Problem List Items Addressed This Visit   None   No follow-ups on file.      No orders of the defined types were placed in this encounter.   No orders of the defined types were placed in this encounter.    Owens Loffler, DO  [place] [facility] [phone] (phone) [phone] (fax)  [place]

## 2023-09-14 ENCOUNTER — Encounter: Payer: Self-pay | Admitting: Internal Medicine

## 2023-09-14 ENCOUNTER — Other Ambulatory Visit: Payer: Self-pay

## 2023-09-14 ENCOUNTER — Observation Stay
Admission: EM | Admit: 2023-09-14 | Discharge: 2023-09-15 | Disposition: A | Discharge status: Home / Self Care | Attending: Obstetrics and Gynecology | Admitting: Obstetrics and Gynecology

## 2023-09-14 DIAGNOSIS — G250 Essential tremor: Secondary | ICD-10-CM | POA: Diagnosis not present

## 2023-09-14 DIAGNOSIS — Z8679 Personal history of other diseases of the circulatory system: Secondary | ICD-10-CM | POA: Insufficient documentation

## 2023-09-14 DIAGNOSIS — F418 Other specified anxiety disorders: Secondary | ICD-10-CM | POA: Diagnosis present

## 2023-09-14 DIAGNOSIS — Z79899 Other long term (current) drug therapy: Secondary | ICD-10-CM | POA: Diagnosis not present

## 2023-09-14 DIAGNOSIS — I2699 Other pulmonary embolism without acute cor pulmonale: Secondary | ICD-10-CM | POA: Diagnosis present

## 2023-09-14 DIAGNOSIS — F17200 Nicotine dependence, unspecified, uncomplicated: Secondary | ICD-10-CM | POA: Diagnosis not present

## 2023-09-14 DIAGNOSIS — E860 Dehydration: Secondary | ICD-10-CM | POA: Diagnosis not present

## 2023-09-14 DIAGNOSIS — E663 Overweight: Secondary | ICD-10-CM | POA: Diagnosis present

## 2023-09-14 DIAGNOSIS — E872 Acidosis, unspecified: Secondary | ICD-10-CM | POA: Diagnosis not present

## 2023-09-14 DIAGNOSIS — I252 Old myocardial infarction: Secondary | ICD-10-CM

## 2023-09-14 DIAGNOSIS — Z86711 Personal history of pulmonary embolism: Secondary | ICD-10-CM | POA: Insufficient documentation

## 2023-09-14 DIAGNOSIS — I129 Hypertensive chronic kidney disease with stage 1 through stage 4 chronic kidney disease, or unspecified chronic kidney disease: Secondary | ICD-10-CM | POA: Insufficient documentation

## 2023-09-14 DIAGNOSIS — N182 Chronic kidney disease, stage 2 (mild): Secondary | ICD-10-CM | POA: Diagnosis present

## 2023-09-14 DIAGNOSIS — E785 Hyperlipidemia, unspecified: Secondary | ICD-10-CM | POA: Diagnosis present

## 2023-09-14 DIAGNOSIS — N179 Acute kidney failure, unspecified: Secondary | ICD-10-CM | POA: Diagnosis not present

## 2023-09-14 DIAGNOSIS — Z96643 Presence of artificial hip joint, bilateral: Secondary | ICD-10-CM | POA: Insufficient documentation

## 2023-09-14 DIAGNOSIS — I2510 Atherosclerotic heart disease of native coronary artery without angina pectoris: Secondary | ICD-10-CM | POA: Diagnosis not present

## 2023-09-14 DIAGNOSIS — I10 Essential (primary) hypertension: Secondary | ICD-10-CM | POA: Diagnosis not present

## 2023-09-14 DIAGNOSIS — Z6829 Body mass index (BMI) 29.0-29.9, adult: Secondary | ICD-10-CM | POA: Insufficient documentation

## 2023-09-14 DIAGNOSIS — R252 Cramp and spasm: Secondary | ICD-10-CM | POA: Diagnosis present

## 2023-09-14 LAB — BASIC METABOLIC PANEL WITH GFR
Anion gap: 12 (ref 5–15)
Anion gap: 12 (ref 5–15)
BUN: 20 mg/dL (ref 8–23)
BUN: 18 mg/dL (ref 8–23)
CO2: 18 mmol/L — ABNORMAL LOW (ref 22–32)
CO2: 16 mmol/L — ABNORMAL LOW (ref 22–32)
Calcium: 9.6 mg/dL (ref 8.9–10.3)
Calcium: 8.6 mg/dL — ABNORMAL LOW (ref 8.9–10.3)
Chloride: 110 mmol/L (ref 98–111)
Chloride: 110 mmol/L (ref 98–111)
Creatinine, Ser: 2.23 mg/dL — ABNORMAL HIGH (ref 0.61–1.24)
Creatinine, Ser: 2.04 mg/dL — ABNORMAL HIGH (ref 0.61–1.24)
GFR, Estimated: 32 mL/min — ABNORMAL LOW (ref >60)
GFR, Estimated: 35 mL/min — ABNORMAL LOW (ref >60)
Glucose, Bld: 113 mg/dL — ABNORMAL HIGH (ref 70–99)
Glucose, Bld: 88 mg/dL (ref 70–99)
Potassium: 4.4 mmol/L (ref 3.5–5.1)
Potassium: 4.6 mmol/L (ref 3.5–5.1)
Sodium: 140 mmol/L (ref 135–145)
Sodium: 138 mmol/L (ref 135–145)

## 2023-09-14 LAB — CBC WITH DIFFERENTIAL/PLATELET
Abs Immature Granulocytes: 0.03 10*3/uL (ref 0.00–0.07)
Basophils Absolute: 0 10*3/uL (ref 0.0–0.1)
Basophils Relative: 0 %
Eosinophils Absolute: 0.1 10*3/uL (ref 0.0–0.5)
Eosinophils Relative: 1 %
HCT: 44.4 % (ref 39.0–52.0)
Hemoglobin: 15.1 g/dL (ref 13.0–17.0)
Immature Granulocytes: 0 %
Lymphocytes Relative: 24 %
Lymphs Abs: 2.4 10*3/uL (ref 0.7–4.0)
MCH: 29.8 pg (ref 26.0–34.0)
MCHC: 34 g/dL (ref 30.0–36.0)
MCV: 87.6 fL (ref 80.0–100.0)
Monocytes Absolute: 0.8 10*3/uL (ref 0.1–1.0)
Monocytes Relative: 8 %
Neutro Abs: 6.8 10*3/uL (ref 1.7–7.7)
Neutrophils Relative %: 67 %
Platelets: 355 10*3/uL (ref 150–400)
RBC: 5.07 MIL/uL (ref 4.22–5.81)
RDW: 13.4 % (ref 11.5–15.5)
WBC: 10.1 10*3/uL (ref 4.0–10.5)
nRBC: 0 % (ref 0.0–0.2)

## 2023-09-14 LAB — HEPATIC FUNCTION PANEL
ALT: 14 U/L (ref 0–44)
AST: 27 U/L (ref 15–41)
Albumin: 4.5 g/dL (ref 3.5–5.0)
Alkaline Phosphatase: 55 U/L (ref 38–126)
Bilirubin, Direct: <0.1 mg/dL (ref 0.0–0.2)
Total Bilirubin: 0.7 mg/dL (ref 0.0–1.2)
Total Protein: 8.6 g/dL — ABNORMAL HIGH (ref 6.5–8.1)

## 2023-09-14 LAB — URINALYSIS, ROUTINE W REFLEX MICROSCOPIC
Bilirubin Urine: NEGATIVE
Glucose, UA: NEGATIVE mg/dL
Hgb urine dipstick: NEGATIVE
Ketones, ur: NEGATIVE mg/dL
Nitrite: NEGATIVE
Protein, ur: NEGATIVE mg/dL
Specific Gravity, Urine: 1.024 (ref 1.005–1.030)
pH: 5 (ref 5.0–8.0)

## 2023-09-14 LAB — CK: Total CK: 376 U/L (ref 49–397)

## 2023-09-14 LAB — TROPONIN I (HIGH SENSITIVITY)
Troponin I (High Sensitivity): 8 ng/L (ref <18)
Troponin I (High Sensitivity): 7 ng/L (ref <18)

## 2023-09-14 LAB — MAGNESIUM: Magnesium: 2.3 mg/dL (ref 1.7–2.4)

## 2023-09-14 LAB — LIPASE, BLOOD: Lipase: 33 U/L (ref 11–51)

## 2023-09-14 MED ORDER — AMLODIPINE BESYLATE 5 MG PO TABS
10.0000 mg | ORAL_TABLET | Freq: Every day | ORAL | Status: DC
  Filled 2023-09-14: qty 2

## 2023-09-14 MED ORDER — METHOCARBAMOL 500 MG PO TABS: 500.0000 mg | ORAL_TABLET | Freq: Three times a day (TID) | ORAL | Status: DC | PRN

## 2023-09-14 MED ORDER — PRIMIDONE 50 MG PO TABS
50.0000 mg | ORAL_TABLET | Freq: Every day | ORAL | Status: DC
  Administered 2023-09-14: 50 mg via ORAL
  Filled 2023-09-14: qty 1

## 2023-09-14 MED ORDER — ONDANSETRON HCL 4 MG/2ML IJ SOLN: 4.0000 mg | Freq: Three times a day (TID) | INTRAMUSCULAR | Status: DC | PRN

## 2023-09-14 MED ORDER — SODIUM CHLORIDE 0.9 % IV SOLN: INTRAVENOUS | Status: DC

## 2023-09-14 MED ORDER — NITROGLYCERIN 0.4 MG SL SUBL: 0.4000 mg | SUBLINGUAL_TABLET | SUBLINGUAL | Status: DC | PRN

## 2023-09-14 MED ORDER — PRAVASTATIN SODIUM 20 MG PO TABS
40.0000 mg | ORAL_TABLET | Freq: Every day | ORAL | Status: DC
  Administered 2023-09-14: 40 mg via ORAL
  Filled 2023-09-14 (×2): qty 2

## 2023-09-14 MED ORDER — SODIUM CHLORIDE 0.9 % IV BOLUS
1000.0000 mL | Freq: Once | INTRAVENOUS | Status: AC
  Administered 2023-09-14: 1000 mL via INTRAVENOUS

## 2023-09-14 MED ORDER — HYDRALAZINE HCL 20 MG/ML IJ SOLN: 5.0000 mg | INTRAMUSCULAR | Status: DC | PRN

## 2023-09-14 MED ORDER — NICOTINE 21 MG/24HR TD PT24
21.0000 mg | MEDICATED_PATCH | Freq: Every day | TRANSDERMAL | Status: DC
  Administered 2023-09-14: 21 mg via TRANSDERMAL
  Filled 2023-09-14 (×2): qty 1

## 2023-09-14 MED ORDER — ASPIRIN 81 MG PO CHEW
81.0000 mg | CHEWABLE_TABLET | Freq: Every day | ORAL | Status: DC
  Administered 2023-09-14: 81 mg via ORAL
  Filled 2023-09-14 (×2): qty 1

## 2023-09-14 MED ORDER — ENOXAPARIN SODIUM 40 MG/0.4ML IJ SOSY
40.0000 mg | PREFILLED_SYRINGE | INTRAMUSCULAR | Status: DC
  Administered 2023-09-14: 40 mg via SUBCUTANEOUS
  Filled 2023-09-14: qty 0.4

## 2023-09-14 MED ORDER — ACETAMINOPHEN 325 MG PO TABS: 650.0000 mg | ORAL_TABLET | Freq: Four times a day (QID) | ORAL | Status: DC | PRN

## 2023-09-14 NOTE — H&P (Signed)
 History and Physical    Que Meneely ZOX:[id number] DOB: 1956/04/13 DOA: 09/14/2023  Referring MD/NP/PA:   PCP: Patient, No Pcp Per   Patient coming from:  The patient is coming from home.     Chief Complaint: muscle cramps and muscle pain  HPI: Fransico Sciandra is a 68 y.o. male with medical history significant of HTN, HLD, depression with anxiety, CKD-2, sciatic nerve pain, essential tremor, PE not on anticoagulants, tobacco abuse, overweight, who presents with muscle cramps and muscle pain.  Patient states that he started having muscle cramps and muscle pains in all extremities after working on the roof today.  She feels lightheaded. He reports drinking multiple bottles of water but only urinating once.  Patient does not have chest pain, SOB.  Patient has some dry cough, no fever or chills.  No nausea, vomiting, diarrhea or abdominal pain.  Patient states that he just finished 10-day course of Bactrim treatment for UTI yesterday.  He states that his symptom of UTI has completely resolved.  Patient states that he did not take his medications in the past several days, but he wants to restart all his medications.  Data reviewed independently and ED Course: pt was found to have worsening renal function with creatinine 2.23, BUN 18 and EGFR 35 (recent baseline creatinine 1.0  on 02/25/2023), WBC 10.1, CK3 76, lipase 73, troponin 8 --> 7.  Patient is placed in MedSurg bed for observation.   EKG: I have personally reviewed.  Sinus rhythm, QTc 455, Q-wave in lead III, early R wave progression.   Review of Systems:   General: no fevers, chills, no body weight gain, has fatigue HEENT: no blurry vision, hearing changes or sore throat Respiratory: no dyspnea, coughing, wheezing CV: no chest pain, no palpitations GI: no nausea, vomiting, abdominal pain, diarrhea, constipation GU: no dysuria, burning on urination, increased urinary frequency, hematuria  Ext: no leg edema Neuro: no unilateral  weakness, numbness, or tingling, no vision change or hearing loss.  Has lightheadedness. Skin: no rash, no skin tear. MSK: No deformity, no limitation of range of movement in spin.  Has muscle spasm and muscle pain Heme: No easy bruising.  Travel history: No recent long distant travel.   Allergy:  Allergies  Allergen Reactions   Penicillins     Past Medical History:  Diagnosis Date   Hyperlipidemia 02/15/2014   Hypertension    Pulmonary embolism (HCC)    Sciatic nerve pain    Tremors of nervous system     Past Surgical History:  Procedure Laterality Date   HIP SURGERY Bilateral    TOTAL HIP ARTHROPLASTY Bilateral     Social History:  reports that he has been smoking. He has never used smokeless tobacco. He reports that he does not drink alcohol and does not use drugs.  Family History:  Family History  Problem Relation Age of Onset   Diabetes Mother    Diabetes Father    Healthy Sister    Lung disease Brother    Healthy Sister    Healthy Sister    Healthy Brother    Healthy Brother      Prior to Admission medications   Medication Sig Start Date End Date Taking? Authorizing Provider  amLODipine  (NORVASC ) 5 MG tablet Take one tab PO daily for blood pressure 11/15/19   Leon Rajas, MD  azithromycin  (ZITHROMAX  Z-PAK) 250 MG tablet Take 2 tabs today; then begin one tab once daily for 4 more days. 12/13/19   Marcia Setters  A, MD  pravastatin  (PRAVACHOL ) 40 MG tablet TAKE 1 TABLET BY MOUTH EVERY DAY FOR CHOLESTEROL 07/31/19   Alexander, Natalie, DO  primidone  (MYSOLINE ) 50 MG tablet Take 1 tablet (50 mg total) by mouth at bedtime. 05/04/19   Cherre Cornish, NP  propranolol  ER (INDERAL  LA) 60 MG 24 hr capsule Take 1 capsule (60 mg total) by mouth daily. For tremor 08/30/18 01/20/19  Syliva Even, MD    Physical Exam: Vitals:   09/14/23 1752 09/14/23 1753  BP: (!) 126/98   Pulse: 89   Resp: 18   Temp: 98.4 F (36.9 C)   TempSrc: Oral   SpO2: 98%   Weight:  96.2 kg   Height:  5\' 11"  (1.803 m)   General: Not in acute distress.  Dry mucosal membrane HEENT:       Eyes: PERRL, EOMI, no jaundice       ENT: No discharge from the ears and nose, no pharynx injection, no tonsillar enlargement.        Neck: No JVD, no bruit, no mass felt. Heme: No neck lymph node enlargement. Cardiac: S1/S2, RRR, No murmurs, No gallops or rubs. Respiratory: No rales, wheezing, rhonchi or rubs. GI: Soft, nondistended, nontender, no rebound pain, no organomegaly, BS present. GU: No hematuria Ext: No pitting leg edema bilaterally. 1+DP/PT pulse bilaterally. Musculoskeletal: No joint deformities, No joint redness or warmth, no limitation of ROM in spin. Skin: No rashes.  Neuro: Alert, oriented X3, cranial nerves II-XII grossly intact, moves all extremities normally.  Psych: Patient is not psychotic, no suicidal or hemocidal ideation.  Labs on Admission: I have personally reviewed following labs and imaging studies  CBC: Recent Labs  Lab 09/14/23 1754  WBC 10.1  NEUTROABS 6.8  HGB 15.1  HCT 44.4  MCV 87.6  PLT 355   Basic Metabolic Panel: Recent Labs  Lab 09/14/23 1754 09/14/23 2012  NA 140 138  K 4.4 4.6  CL 110 110  CO2 18* 16*  GLUCOSE 113* 88  BUN 20 18  CREATININE 2.23* 2.04*  CALCIUM 9.6 8.6*  MG 2.3  --    GFR: Estimated Creatinine Clearance: 42.2 mL/min (A) (by C-G formula based on SCr of 2.04 mg/dL (H)). Liver Function Tests: Recent Labs  Lab 09/14/23 1754  AST 27  ALT 14  ALKPHOS 55  BILITOT 0.7  PROT 8.6*  ALBUMIN 4.5   Recent Labs  Lab 09/14/23 1754  LIPASE 33   No results for input(s): "AMMONIA" in the last 168 hours. Coagulation Profile: No results for input(s): "INR", "PROTIME" in the last 168 hours. Cardiac Enzymes: Recent Labs  Lab 09/14/23 1754  CKTOTAL 376   BNP (last 3 results) No results for input(s): "PROBNP" in the last 8760 hours. HbA1C: No results for input(s): "HGBA1C" in the last 72 hours. CBG: No results  for input(s): "GLUCAP" in the last 168 hours. Lipid Profile: No results for input(s): "CHOL", "HDL", "LDLCALC", "TRIG", "CHOLHDL", "LDLDIRECT" in the last 72 hours. Thyroid Function Tests: No results for input(s): "TSH", "T4TOTAL", "FREET4", "T3FREE", "THYROIDAB" in the last 72 hours. Anemia Panel: No results for input(s): "VITAMINB12", "FOLATE", "FERRITIN", "TIBC", "IRON", "RETICCTPCT" in the last 72 hours. Urine analysis:    Component Value Date/Time   COLORURINE YELLOW (A) 09/14/2023 2127   APPEARANCEUR HAZY (A) 09/14/2023 2127   LABSPEC 1.024 09/14/2023 2127   PHURINE 5.0 09/14/2023 2127   GLUCOSEU NEGATIVE 09/14/2023 2127   HGBUR NEGATIVE 09/14/2023 2127   BILIRUBINUR NEGATIVE 09/14/2023 2127   BILIRUBINUR  negative 11/15/2019 1820   KETONESUR NEGATIVE 09/14/2023 2127   PROTEINUR NEGATIVE 09/14/2023 2127   UROBILINOGEN 0.2 11/15/2019 1820   NITRITE NEGATIVE 09/14/2023 2127   LEUKOCYTESUR TRACE (A) 09/14/2023 2127   Sepsis Labs: @LABRCNTIP (procalcitonin:4,lacticidven:4) )No results found for this or any previous visit (from the past 240 hours).   Radiological Exams on Admission:   Assessment/Plan Principal Problem:   Acute kidney injury superimposed on stage 2 chronic kidney disease (HCC) Active Problems:   Dehydration   Metabolic acidosis   HTN (hypertension)   Hyperlipidemia   Essential tremor   Anxiety with depression   Overweight (BMI 25.0-29.9)   Assessment and Plan:  Acute kidney injury superimposed on stage 2 chronic kidney disease (HCC): Likely due to dehydration.  Patient is clinically dry.  Recent Bactrim use may have contributed partially.  Bladder scan was performed in the ED, which is negative for urinary retention.  -place in med-surg bed for obs - IV fluid: 2 L normal saline, then 125 cc/h - Avoid using renal toxic medications - Follow-up renal function by BMP - f/u UA  Muscle spasm and muscle pain: CK level 376.  No rhabdo yet - Supportive  care - As needed Robaxin  and Tylenol   Dehydration -On IV fluid as above  Metabolic acidosis: Bicarbonate 16, unclear etiology, may be related dehydration? - IV fluid as above  HTN (hypertension): Blood pressure 126/98 - Restart home medications amlodipine  10 mg daily tomorrow morning  Hyperlipidemia -Pravastatin   Essential tremor -Primidone   Anxiety with depression: Patient is not taking medications currently. - Observe closely  Overweight (BMI 25.0-29.9): Body weight 96.2 kg, BMI 29.57 - Encourage losing weight - Exercise healthy diet       DVT ppx:  SQ Lovenox   Code Status: Full code    Family Communication:     not done, no family member is at bed side.    Disposition Plan:  Anticipate discharge back to previous environment  Consults called:  none  Admission status and Level of care: Med-Surg:    for obs     Dispo: The patient is from: Home              Anticipated d/c is to: Home              Anticipated d/c date is: 1 day              Patient currently is not medically stable to d/c.    Severity of Illness:  The appropriate patient status for this patient is OBSERVATION. Observation status is judged to be reasonable and necessary in order to provide the required intensity of service to ensure the patient's safety. The patient's presenting symptoms, physical exam findings, and initial radiographic and laboratory data in the context of their medical condition is felt to place them at decreased risk for further clinical deterioration. Furthermore, it is anticipated that the patient will be medically stable for discharge from the hospital within 2 midnights of admission.        Date of Service 09/14/2023    Fidencio Hue [facility]   If 7PM-7AM, please contact night-coverage [email or web address] 09/14/2023, 10:16 PM

## 2023-09-14 NOTE — ED Notes (Signed)
 Bladder scan resulted .

## 2023-09-14 NOTE — ED Provider Notes (Signed)
 [facility] Provider Note    Event Date/Time   First MD Initiated Contact with Patient 09/14/23 1828     (approximate)   History   Cramping   HPI  Jesse [name] is a 68 y.o. male who comes in for cramping.  Patient reports body cramping that started after working on the roof today.  He reports drinking multiple bottles of water but only urinating once.  Patient reports having some cramping sensation in his hands and his legs.  He states that it was difficulty opening up his hands fully secondary to the cramping sensation.  He does report issues with dehydration previously.  On review of records pt was seen on 4/10 and placed on bactrim for 10 days    Physical Exam   Triage Vital Signs: ED Triage Vitals  Encounter Vitals Group     BP 09/14/23 1752 (!) 126/98     Systolic BP Percentile --      Diastolic BP Percentile --      Pulse Rate 09/14/23 1752 89     Resp 09/14/23 1752 18     Temp 09/14/23 1752 98.4 F (36.9 C)     Temp Source 09/14/23 1752 Oral     SpO2 09/14/23 1752 98 %     Weight 09/14/23 1753 212 lb (96.2 kg)     Height 09/14/23 1753 5\' 11"  (1.803 m)     Head Circumference --      Peak Flow --      Pain Score 09/14/23 1752 0     Pain Loc --      Pain Education --      Exclude from Growth Chart --     Most recent vital signs: Vitals:   09/14/23 1752  BP: (!) 126/98  Pulse: 89  Resp: 18  Temp: 98.4 F (36.9 C)  SpO2: 98%     General: Awake, no distress.  CV:  Good peripheral perfusion.  Resp:  Normal effort.  Abd:  No distention. Soft and non tender  Other:  No cramping at this time- opening and closing hands well    ED Results / Procedures / Treatments   Labs (all labs ordered are listed, but only abnormal results are displayed) Labs Reviewed  BASIC METABOLIC PANEL WITH GFR - Abnormal; Notable for the following components:      Result Value   CO2 18 (*)    Glucose, Bld 113 (*)    Creatinine, Ser 2.23 (*)     GFR, Estimated 32 (*)    All other components within normal limits  CBC WITH DIFFERENTIAL/PLATELET  CK     EKG  My interpretation of EKG:  Normal sinus rate of 68 without any ST elevation, T wave version lead III, normal intervals   PROCEDURES:  Critical Care performed: No  Procedures   MEDICATIONS ORDERED IN ED: Medications - No data to display   IMPRESSION / MDM / ASSESSMENT AND PLAN / ED COURSE  I reviewed the triage vital signs and the nursing notes.   Patient's presentation is most consistent with acute presentation with potential threat to life or bodily function.  Differential includes dehydration, rhabdo, Electra abnormalities, AKI  Creatinine is elevated at 2.23 with a last check back 1 year ago of 1.03  8:00 PM Will get postvoid bladder scan or a bladder scan to ensure no evidence of retention.  Offered patient admission for elevated creatinine for hydration overnight patient declines since after liter of fluid  he is actually feeling much better.  Will give an additional 1 L of fluid while we recheck some blood work.  On review of records patient is on Bactrim so I do suspect that this is related to the Bactrim plus dehydration.  [name] already finished his course.  Will get urine to make sure no UTI.  However at this time patient would like to go home but will reevaluate after second liter of fluid  9:16 PM patient's repeat blood work shows worsening bicarb after a liter of fluid.  He denies any significant aspirin  use other than a baby aspirin  daily but denies any BC powders.  He denies any alcohol use.  Discussed with patient and he still not been able to pee after 2 L of fluid I think he would do better with monitoring overnight with hydration he expressed understanding and felt comfortable with this plan  The patient is on the cardiac monitor to evaluate for evidence of arrhythmia and/or significant heart rate changes.      FINAL CLINICAL IMPRESSION(S) / ED  DIAGNOSES   Final diagnoses:  AKI (acute kidney injury) (HCC)     Rx / DC Orders   ED Discharge Orders     None        Note:  This document was prepared using Dragon voice recognition software and may include unintentional dictation errors.   Lubertha Rush, MD 09/14/23 2117

## 2023-09-14 NOTE — ED Triage Notes (Signed)
 Patient to ED via POV for cramping. Pt reports it started after working on the roof today. States he drank multiple bottles of water but only urinated once.

## 2023-09-15 DIAGNOSIS — N182 Chronic kidney disease, stage 2 (mild): Secondary | ICD-10-CM | POA: Diagnosis not present

## 2023-09-15 DIAGNOSIS — N179 Acute kidney failure, unspecified: Secondary | ICD-10-CM | POA: Diagnosis not present

## 2023-09-15 LAB — BASIC METABOLIC PANEL WITH GFR
Anion gap: 4 — ABNORMAL LOW (ref 5–15)
BUN: 19 mg/dL (ref 8–23)
CO2: 19 mmol/L — ABNORMAL LOW (ref 22–32)
Calcium: 8.1 mg/dL — ABNORMAL LOW (ref 8.9–10.3)
Chloride: 114 mmol/L — ABNORMAL HIGH (ref 98–111)
Creatinine, Ser: 1.53 mg/dL — ABNORMAL HIGH (ref 0.61–1.24)
GFR, Estimated: 50 mL/min — ABNORMAL LOW (ref >60)
Glucose, Bld: 104 mg/dL — ABNORMAL HIGH (ref 70–99)
Potassium: 4.1 mmol/L (ref 3.5–5.1)
Sodium: 137 mmol/L (ref 135–145)

## 2023-09-15 LAB — HIV ANTIBODY (ROUTINE TESTING W REFLEX): HIV Screen 4th Generation wRfx: NONREACTIVE

## 2023-09-15 NOTE — Discharge Summary (Signed)
 Yuvaan Olander WUJ:[id number] DOB: [birth year] DOA: 09/14/2023  PCP: Patient, No Pcp Per  Admit date: 09/14/2023 Discharge date: 09/15/2023  Time spent: 35 minutes  Recommendations for Outpatient Follow-up:  Pcp f/u 5/4 as scheduled, check BMP then Avoid bactrim in the future     Discharge Diagnoses:  Principal Problem:   Acute kidney injury superimposed on stage 2 chronic kidney disease (HCC) Active Problems:   Dehydration   Metabolic acidosis   HTN (hypertension)   Hyperlipidemia   Essential tremor   Anxiety with depression   Overweight (BMI 25.0-29.9)   Coronary artery disease involving native coronary artery of native heart without angina pectoris   History of non-ST elevation myocardial infarction (NSTEMI)   PE (pulmonary thromboembolism) (HCC)   Discharge Condition: improved  Diet recommendation: heart healthy  Filed Weights   09/14/23 1753  Weight: 96.2 kg    History of present illness:  From admission h and p  Maynard David is a 68 y.o. male with medical history significant of HTN, HLD, depression with anxiety, CKD-2, sciatic nerve pain, essential tremor, PE not on anticoagulants, tobacco abuse, overweight, who presents with muscle cramps and muscle pain.   Patient states that he started having muscle cramps and muscle pains in all extremities after working on the roof today.  She feels lightheaded. He reports drinking multiple bottles of water but only urinating once.  Patient does not have chest pain, SOB.  Patient has some dry cough, no fever or chills.  No nausea, vomiting, diarrhea or abdominal pain.  Patient states that he just finished 10-day course of Bactrim treatment for UTI yesterday.  He states that his symptom of UTI has completely resolved.  Patient states that he did not take his medications in the past several days, but he wants to restart all his medications.    Hospital Course:  Patient presents with muscle cramps and generally feeling unwell after  a day spent working on his roof. Was treated for uti recently with bactrim, finished that 2 days ago, uti symptoms resolved. Here found to have aki. Bactrim is likely the main culprit, some dehydration from a hard day working on the roof probably contributed. Here he was treated with IV hydration and cr is improved from 2.23 to 1.53 with baseline of 1.1. bicarb improved from 16 to 19. Patient is feeling almost completely better, tolerating diet, ambulating without dysfunction. BPs are soft, he is asymptomatic. Advise holding home amlodipine  and f/u with pcp on 5/4 as scheduled, would repeat a bmp then. Avoid bactrim in the future. And push hydration between now and f/u with PCP.   Procedures: none   Consultations: none  Discharge Exam: Vitals:   09/15/23 0600 09/15/23 0651  BP: 99/75   Pulse: 73   Resp: 16   Temp:  98.2 F (36.8 C)  SpO2: 98%     General: NAD Cardiovascular: RRR Respiratory: CTAB  Discharge Instructions   Discharge Instructions     Diet - low sodium heart healthy   Complete by: As directed    Increase activity slowly   Complete by: As directed       Allergies as of 09/15/2023       Reactions   Bactrim [sulfamethoxazole-trimethoprim] Other (See Comments)   Acute kidney injury   Penicillins         Medication List     PAUSE taking these medications    amLODipine  10 MG tablet Wait to take this until your doctor or other care provider tells  you to start again. Commonly known as: NORVASC  Take 10 mg by mouth daily.       STOP taking these medications    azithromycin  250 MG tablet Commonly known as: Zithromax  Z-Pak       TAKE these medications    aspirin  81 MG chewable tablet Chew 81 mg by mouth daily.   nitroGLYCERIN  0.4 MG SL tablet Commonly known as: NITROSTAT  Place 0.4 mg under the tongue every 5 (five) minutes as needed for chest pain.   pravastatin  40 MG tablet Commonly known as: PRAVACHOL  TAKE 1 TABLET BY MOUTH EVERY DAY FOR  CHOLESTEROL   primidone  50 MG tablet Commonly known as: MYSOLINE  Take 1 tablet (50 mg total) by mouth at bedtime.       Allergies  Allergen Reactions   Bactrim [Sulfamethoxazole-Trimethoprim] Other (See Comments)    Acute kidney injury   Penicillins     Follow-up Information     Skeeter Dukes, NP Follow up.   Specialty: Family Medicine Why: on 5/4 as scheduled                 The results of significant diagnostics from this hospitalization (including imaging, microbiology, ancillary and laboratory) are listed below for reference.    Significant Diagnostic Studies: No results found.  Microbiology: No results found for this or any previous visit (from the past 240 hours).   Labs: Basic Metabolic Panel: Recent Labs  Lab 09/14/23 1754 09/14/23 2012 09/15/23 0557  NA 140 138 137  K 4.4 4.6 4.1  CL 110 110 114*  CO2 18* 16* 19*  GLUCOSE 113* 88 104*  BUN 20 18 19   CREATININE 2.23* 2.04* 1.53*  CALCIUM 9.6 8.6* 8.1*  MG 2.3  --   --    Liver Function Tests: Recent Labs  Lab 09/14/23 1754  AST 27  ALT 14  ALKPHOS 55  BILITOT 0.7  PROT 8.6*  ALBUMIN 4.5   Recent Labs  Lab 09/14/23 1754  LIPASE 33   No results for input(s): "AMMONIA" in the last 168 hours. CBC: Recent Labs  Lab 09/14/23 1754  WBC 10.1  NEUTROABS 6.8  HGB 15.1  HCT 44.4  MCV 87.6  PLT 355   Cardiac Enzymes: Recent Labs  Lab 09/14/23 1754  CKTOTAL 376   BNP: BNP (last 3 results) No results for input(s): "BNP" in the last 8760 hours.  ProBNP (last 3 results) No results for input(s): "PROBNP" in the last 8760 hours.  CBG: No results for input(s): "GLUCAP" in the last 168 hours.     Signed:  Raymonde Calico MD.  [facility] 09/15/2023, 9:10 AM

## 2023-09-22 LAB — BLOOD GAS, VENOUS
Acid-base deficit: 5.4 mmol/L — ABNORMAL HIGH (ref 0.0–2.0)
Bicarbonate: 20.6 mmol/L (ref 20.0–28.0)
Patient temperature: 37
pCO2, Ven: 41 mmHg — ABNORMAL LOW (ref 44–60)
pH, Ven: 7.31 (ref 7.25–7.43)

## 2023-10-27 ENCOUNTER — Encounter (HOSPITAL_COMMUNITY): Payer: Self-pay

## 2023-10-27 ENCOUNTER — Ambulatory Visit (HOSPITAL_COMMUNITY)
Admission: EM | Admit: 2023-10-27 | Discharge: 2023-10-27 | Disposition: A | Discharge status: Home / Self Care | Attending: Nurse Practitioner | Admitting: Nurse Practitioner

## 2023-10-27 ENCOUNTER — Telehealth: Payer: Self-pay

## 2023-10-27 DIAGNOSIS — Z125 Encounter for screening for malignant neoplasm of prostate: Secondary | ICD-10-CM | POA: Insufficient documentation

## 2023-10-27 DIAGNOSIS — N3001 Acute cystitis with hematuria: Secondary | ICD-10-CM | POA: Insufficient documentation

## 2023-10-27 LAB — POCT URINALYSIS DIP (MANUAL ENTRY)
Bilirubin, UA: NEGATIVE
Glucose, UA: NEGATIVE mg/dL
Ketones, POC UA: NEGATIVE mg/dL
Nitrite, UA: POSITIVE — AB
Protein Ur, POC: NEGATIVE mg/dL
Spec Grav, UA: 1.015 (ref 1.010–1.025)
Urobilinogen, UA: 1 U/dL
pH, UA: 7 (ref 5.0–8.0)

## 2023-10-27 LAB — BASIC METABOLIC PANEL WITH GFR
Anion gap: 8 (ref 5–15)
BUN: 7 mg/dL — ABNORMAL LOW (ref 8–23)
CO2: 25 mmol/L (ref 22–32)
Calcium: 9.3 mg/dL (ref 8.9–10.3)
Chloride: 107 mmol/L (ref 98–111)
Creatinine, Ser: 1.04 mg/dL (ref 0.61–1.24)
GFR, Estimated: >60 mL/min (ref >60)
Glucose, Bld: 127 mg/dL — ABNORMAL HIGH (ref 70–99)
Potassium: 4.4 mmol/L (ref 3.5–5.1)
Sodium: 140 mmol/L (ref 135–145)

## 2023-10-27 LAB — PSA: Prostatic Specific Antigen: 2.22 ng/mL (ref 0.00–4.00)

## 2023-10-27 MED ORDER — HYDRALAZINE HCL 25 MG PO TABS: 25.0000 mg | ORAL_TABLET | Freq: Two times a day (BID) | ORAL | 0 refills | Status: DC

## 2023-10-27 MED ORDER — CEFDINIR 300 MG PO CAPS: 300.0000 mg | ORAL_CAPSULE | Freq: Two times a day (BID) | ORAL | 0 refills | Status: DC

## 2023-10-27 NOTE — ED Provider Notes (Signed)
 [facility]    CSN: [id number] Arrival date & time: 10/27/23  1051      History   Chief Complaint Chief Complaint  Patient presents with   Hematuria    HPI Jesse [name] is a 68 y.o. male.   Jesse [name] is a 68 y.o. 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 Patient was noted to have moderate blood and positive nitrites. The patient was referred by his PCP to urology which he saw on September 02, 2023 for hematuria and dysuria. At that time, he was diagnosed with a urinary tract infection (UTI) and treated with a 10 day course of Bactrim, which resolved his symptoms. He was told to return to the clinic in 2 months to recheck urine. If still positive for blood, then a hematuria workup would be initiated. Additionally, it was noted that a PSA should be checked as he had none on record. On April 22nd (2 days after completing the bactrim), patient was admitted to [facility] due to generalized body cramps and decreased urination. He thought that his symptoms was possibly related to dehydration as he had been working on a roof all that day prior to symptom onset. During that admission, his kidney function was noted to be elevated at 2.23 with a baseline of 1.03 noted on 12/27/21. He was admitted overnight for acute kidney injury which was attributed mainly to recent Bactrim use although dehydration could have also been a contributing factor. His Creatinine trended down during the overnight admission and was 1.53 at discharge. Bactrim was added to his list of allergies and patient was advise to never take this medication again. He was also told to hold his amlodipine  until seen by his PCP and BMP could be rechecked. He was scheduled to see his PCP on May 5th, however, patient never follow-up with PCP and has been still taking amlodipine  daily. The patient currently denies any dysuria or other  urinary symptoms. 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 He mentions noticing a scab-like formation on his penis, slightly over half an inch long, after his visit to the urologist, but this has since resolved.   The following portions of the patient's history were reviewed and updated as appropriate: allergies, current medications, past family history, past medical history, past social history, past surgical history, and problem list.       Past Medical History:  Diagnosis Date   Hyperlipidemia 02/15/2014   Hypertension    Pulmonary embolism (HCC)    Sciatic nerve pain    Tremors of nervous system     Patient Active Problem List   Diagnosis Date Noted   Acute kidney injury superimposed on stage 2 chronic kidney disease (HCC) 09/14/2023   HTN (hypertension) 09/14/2023   Anxiety with depression 09/14/2023   Metabolic acidosis 09/14/2023   Overweight (BMI 25.0-29.9) 09/14/2023   Dehydration 09/14/2023   Coronary artery disease involving native coronary artery of native heart without angina pectoris 01/01/2022   History of non-ST elevation myocardial infarction (NSTEMI) 01/01/2022   Essential tremor 08/30/2018   Trochanteric bursitis of left hip 08/30/2018   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   History of pulmonary embolism 09/09/2015   Pulmonary nodule, left 09/09/2015   PE (pulmonary thromboembolism) (HCC) 09/09/2015   Anxiety 02/15/2014   Dependence on nicotine  from cigarettes 02/15/2014   Depression 02/15/2014  Family history of diabetes mellitus in mother 02/15/2014   H/O bilateral hip replacements 02/15/2014   Hyperlipidemia 02/15/2014   Sleep disturbance 02/15/2014    Past Surgical History:  Procedure Laterality Date   HIP SURGERY Bilateral    TOTAL HIP ARTHROPLASTY Bilateral        Home Medications    Prior to Admission medications   Medication Sig Start Date End Date Taking?  Authorizing Provider  aspirin  81 MG chewable tablet Chew 81 mg by mouth daily. 05/27/22  Yes [provider]  cefdinir (OMNICEF) 300 MG capsule Take 1 capsule (300 mg total) by mouth 2 (two) times daily. 10/27/23  Yes Maryruth Sol, FNP  hydrALAZINE  (APRESOLINE ) 25 MG tablet Take 1 tablet (25 mg total) by mouth 2 (two) times daily. 10/27/23  Yes Karolee Meloni, FNP  pravastatin  (PRAVACHOL ) 40 MG tablet TAKE 1 TABLET BY MOUTH EVERY DAY FOR CHOLESTEROL 07/31/19  Yes Alexander, Natalie, DO  nitroGLYCERIN  (NITROSTAT ) 0.4 MG SL tablet Place 0.4 mg under the tongue every 5 (five) minutes as needed for chest pain. 01/01/22   [provider]  propranolol  ER (INDERAL  LA) 60 MG 24 hr capsule Take 1 capsule (60 mg total) by mouth daily. For tremor 08/30/18 01/20/19  Syliva Even, MD    Family History Family History  Problem Relation Age of Onset   Diabetes Mother    Diabetes Father    Healthy Sister    Lung disease Brother    Healthy Sister    Healthy Sister    Healthy Brother    Healthy Brother     Social History Social History   Tobacco Use   Smoking status: Some Days    Types: Cigarettes   Smokeless tobacco: Never   Tobacco comments:    trying to quit smoking, smokes 3 cigs q mth, uses vapes daily.   Vaping Use   Vaping status: Never Used  Substance Use Topics   Alcohol use: No   Drug use: No     Allergies   Bactrim [sulfamethoxazole-trimethoprim] and Penicillins   Review of Systems Review of Systems  Genitourinary:  Negative for dysuria and hematuria.  All other systems reviewed and are negative.    Physical Exam Triage Vital Signs ED Triage Vitals  Encounter Vitals Group     BP 10/27/23 1133 (!) 159/107     Systolic BP Percentile --      Diastolic BP Percentile --      Pulse Rate 10/27/23 1133 (!) 54     Resp 10/27/23 1133 16     Temp 10/27/23 1133 97.9 F (36.6 C)     Temp Source 10/27/23 1133 Oral     SpO2 10/27/23 1133 98 %     Weight 10/27/23  1132 217 lb (98.4 kg)     Height 10/27/23 1132 5\' 11"  (1.803 m)     Head Circumference --      Peak Flow --      Pain Score 10/27/23 1130 1     Pain Loc --      Pain Education --      Exclude from Growth Chart --    No data found.  Updated Vital Signs BP (!) 159/107 (BP Location: Left Arm)   Pulse (!) 54   Temp 97.9 F (36.6 C) (Oral)   Resp 16   Ht 5\' 11"  (1.803 m)   Wt 217 lb (98.4 kg)   SpO2 98%   BMI 30.27 kg/m   Visual Acuity Right  Eye Distance:   Left Eye Distance:   Bilateral Distance:    Right Eye Near:   Left Eye Near:    Bilateral Near:     Physical Exam Vitals reviewed.  Constitutional:      General: [name] not in acute distress.    Appearance: Normal appearance. [name] not toxic-appearing.  HENT:     Head: Normocephalic.     Mouth/Throat:     Mouth: Mucous membranes are moist.  Eyes:     Conjunctiva/sclera: Conjunctivae normal.  Cardiovascular:     Rate and Rhythm: Normal rate and regular rhythm.     Heart sounds: Normal heart sounds.  Pulmonary:     Effort: Pulmonary effort is normal.     Breath sounds: Normal breath sounds.  Musculoskeletal:        General: Normal range of motion.  Skin:    General: Skin is warm and dry.  Neurological:     General: No focal deficit present.     Mental Status: [name] alert and oriented to person, place, and time.      UC Treatments / Results  Labs (all labs ordered are listed, but only abnormal results are displayed) Labs Reviewed  POCT URINALYSIS DIP (MANUAL ENTRY) - Abnormal; Notable for the following components:      Result Value   Color, UA straw (*)    Clarity, UA cloudy (*)    Blood, UA trace-intact (*)    Nitrite, UA Positive (*)    Leukocytes, UA Large (3+) (*)    All other components within normal limits  URINE CULTURE  BASIC METABOLIC PANEL WITH GFR  PSA    EKG   Radiology No results found.  Procedures Procedures (including critical care time)  Medications Ordered in  UC Medications - No data to display  Initial Impression / Assessment and Plan / UC Course  I have reviewed the triage vital signs and the nursing notes.  Pertinent labs & imaging results that were available during my care of the patient were reviewed by me and considered in my medical decision making (see chart for details).    68 year old male presenting after abnormal urinalysis findings during a DOT physical, including moderate blood and positive nitrites. He has a recent history of UTI diagnosed by urology on September 02, 2023, and was treated with Bactrim, which resolved his symptoms. Two days after completing treatment, he was admitted for acute kidney injury attributed to Bactrim use, with a peak creatinine of 2.23 and discharge creatinine of 1.53. Bactrim was added to his allergy list, and he was advised to avoid it in the future. Although instructed to follow up with his PCP on May 5th and hold amlodipine , he did not follow up and continued taking the medication daily. He currently denies urinary symptoms but reports a resolved scab-like lesion on the penis. In clinic, he is afebrile, nontoxic, and has a BP of 159/107 while on daily amlodipine . Urinalysis today shows trace RBCs, positive nitrites, and large leukocytes, consistent with possible ongoing or recurrent UTI. Given his recent Klebsiella pneumoniae infection, history of acute kidney injury, PCN allergy, and inability to tolerate Bactrim, cefdinir was selected as the safest treatment option. BMP and PSA were collected to evaluate renal function and screen for prostate concerns; urine culture was also obtained. He was informed that he will be contacted only if results are abnormal and may view all labs in MyChart. Regarding hypertension, he was advised to stop amlodipine  and start hydralazine   50 mg BID as a temporary measure. He was counseled to follow up promptly with his PCP for continued blood pressure management and monitoring, as well as  with urology for further evaluation. He was encouraged to contact both offices today to arrange follow-up.  Today's evaluation has revealed no signs of a dangerous process. Discussed diagnosis with patient and/or guardian. Patient and/or guardian aware of their diagnosis, possible red flag symptoms to watch out for and need for close follow up. Patient and/or guardian understands verbal and written discharge instructions. Patient and/or guardian comfortable with plan and disposition.  Patient and/or guardian has a clear mental status at this time, good insight into illness (after discussion and teaching) and has clear judgment to make decisions regarding their care  Documentation was completed with the aid of voice recognition software. Transcription may contain typographical errors. Final Clinical Impressions(s) / UC Diagnoses   Final diagnoses:  Acute cystitis with hematuria     Discharge Instructions      You were seen today for follow-up after abnormal urine findings were noted during your DOT physical yesterday. You have a history of a urinary tract infection diagnosed in April, which was treated with an antibiotic called Bactrim. Since then, you were hospitalized for an episode of acute kidney injury that may have been related to the bactrim and dehydration. Bactrim has been added to your allergy list, and you should never take it again. Today's urinalysis shows signs of a possible infection, and based on your history and current risks, you were prescribed cefdinir, which is a safer antibiotic treatment option. A urine culture, kidney panel, and PSA test were collected to monitor for infection, kidney function, and prostate health. You will only be contacted if any results are abnormal; otherwise, you may view them in MyChart. Your blood pressure today was elevated despite being on amlodipine . You were advised to stop taking amlodipine  when you were discharged from the hospital but have been  still taking it. Please stop taking it for now and instead start the new medication that I prescribed called hydralazine  50 mg twice daily. It is very important that you follow up with your primary care provider to monitor your blood pressure and kidney function, and also follow up with urology for continued evaluation. Please contact both offices today to schedule these appointments. I have provided you with a synopsis of your visit today that you can give to your primary care and urology providers for continued and seamless care.   Go to the ED if you develop new or worsening symptoms such as fever, burning with urination, back pain, chest pain, or severe headache.    ED Prescriptions     Medication Sig Dispense Auth. Provider   hydrALAZINE  (APRESOLINE ) 25 MG tablet Take 1 tablet (25 mg total) by mouth 2 (two) times daily. 60 tablet Mykela Mewborn, [place], FNP   cefdinir (OMNICEF) 300 MG capsule Take 1 capsule (300 mg total) by mouth 2 (two) times daily. 14 capsule Maryruth Sol, FNP      PDMP not reviewed this encounter.   Maryruth Sol, [place] 10/27/23 1429

## 2023-10-27 NOTE — Telephone Encounter (Signed)
 Not a patient with this facility  Copied from CRM [id number]. Topic: Clinical - Medical Advice >> Oct 26, 2023 11:25 AM Zipporah Him wrote: Reason for CRM: Patient is requesting a call back on a diagnosis he received, I don't see where he was seen at this office. He seems to be confused as he doesn't know who he was needing to speak with.

## 2023-10-27 NOTE — ED Triage Notes (Signed)
 Patient presenting with blood in the urine, left flank pain, lower left side back pain onset 3-4 weeks ago. Patient states he was seen and treated for hematuria 2 months ago.  Prescriptions or OTC medications tried: No

## 2023-10-27 NOTE — Discharge Instructions (Addendum)
 You were seen today for follow-up after abnormal urine findings were noted during your DOT physical yesterday. You have a history of a urinary tract infection diagnosed in April, which was treated with an antibiotic called Bactrim. Since then, you were hospitalized for an episode of acute kidney injury that may have been related to the bactrim and dehydration. Bactrim has been added to your allergy list, and you should never take it again. Today's urinalysis shows signs of a possible infection, and based on your history and current risks, you were prescribed cefdinir, which is a safer antibiotic treatment option. A urine culture, kidney panel, and PSA test were collected to monitor for infection, kidney function, and prostate health. You will only be contacted if any results are abnormal; otherwise, you may view them in MyChart. Your blood pressure today was elevated despite being on amlodipine . You were advised to stop taking amlodipine  when you were discharged from the hospital but have been still taking it. Please stop taking it for now and instead start the new medication that I prescribed called hydralazine  50 mg twice daily. It is very important that you follow up with your primary care provider to monitor your blood pressure and kidney function, and also follow up with urology for continued evaluation. Please contact both offices today to schedule these appointments. I have provided you with a synopsis of your visit today that you can give to your primary care and urology providers for continued and seamless care.   Go to the ED if you develop new or worsening symptoms such as fever, burning with urination, back pain, chest pain, or severe headache.

## 2023-10-28 ENCOUNTER — Ambulatory Visit (HOSPITAL_COMMUNITY): Payer: Self-pay

## 2023-10-29 LAB — URINE CULTURE: Culture: >=100000 — AB

## 2024-01-14 ENCOUNTER — Other Ambulatory Visit: Payer: Self-pay

## 2024-01-14 ENCOUNTER — Ambulatory Visit (INDEPENDENT_AMBULATORY_CARE_PROVIDER_SITE_OTHER)

## 2024-01-14 VITALS — BP 146/90 | HR 56 | Ht 71.0 in | Wt 199.4 lb

## 2024-01-14 DIAGNOSIS — I2510 Atherosclerotic heart disease of native coronary artery without angina pectoris: Secondary | ICD-10-CM | POA: Insufficient documentation

## 2024-01-14 DIAGNOSIS — F17210 Nicotine dependence, cigarettes, uncomplicated: Secondary | ICD-10-CM

## 2024-01-14 DIAGNOSIS — R739 Hyperglycemia, unspecified: Secondary | ICD-10-CM | POA: Insufficient documentation

## 2024-01-14 DIAGNOSIS — Z125 Encounter for screening for malignant neoplasm of prostate: Secondary | ICD-10-CM

## 2024-01-14 DIAGNOSIS — N62 Hypertrophy of breast: Secondary | ICD-10-CM | POA: Insufficient documentation

## 2024-01-14 DIAGNOSIS — I159 Secondary hypertension, unspecified: Secondary | ICD-10-CM | POA: Diagnosis not present

## 2024-01-14 DIAGNOSIS — Z86711 Personal history of pulmonary embolism: Secondary | ICD-10-CM | POA: Diagnosis not present

## 2024-01-14 DIAGNOSIS — G250 Essential tremor: Secondary | ICD-10-CM

## 2024-01-14 DIAGNOSIS — I25118 Atherosclerotic heart disease of native coronary artery with other forms of angina pectoris: Secondary | ICD-10-CM | POA: Diagnosis not present

## 2024-01-14 MED ORDER — PRAVASTATIN SODIUM 40 MG PO TABS: 40.0000 mg | ORAL_TABLET | Freq: Every day | ORAL | 1 refills | Status: DC

## 2024-01-14 MED ORDER — BUPROPION HCL ER (SR) 150 MG PO TB12: 150.0000 mg | ORAL_TABLET | Freq: Two times a day (BID) | ORAL | 1 refills | Status: DC

## 2024-01-14 MED ORDER — PROPRANOLOL HCL ER 60 MG PO CP24: 60.0000 mg | ORAL_CAPSULE | Freq: Every day | ORAL | 1 refills | Status: AC

## 2024-01-14 NOTE — Progress Notes (Signed)
 New Patient Visit   Physician: Gabbie Marzo A Rhodes Calvert, MD  Patient: Jesse [name]   DOB: March 10, 1956   68 y.o. Male  MRN: [id number] Visit Date: 01/14/2024   Chief Complaint  Patient presents with   Establish Care   Subjective  Jesse [name] is a 68 y.o. male who presents today as a new patient to establish care.   HPI  Discussed the use of AI scribe software for clinical note transcription with the patient, who gave verbal consent to proceed.  History of Present Illness   Jesse [name] is a 68 year old male with coronary artery disease who presents for a new patient visit and medication management.  Patient with hospital admission approximately 2 year prior due to chest pain. He was found on cardiac catheterization to have an occlusion in the proximal left circumflex artery with collateral circulation. The overall finding indicated that he had demand ischemia without an acute event. The last echocardiogram from 08/23 showed a left ventricular ejection fraction of 65%. Overall, the echocardiography results were normal.  Chest pain occurs with ambulation of one tenth of a mile or more - No chest pain at rest - Occasional palpitations  - No recent use of nitroglycerin  Cardiovascular history - Non-ST elevation myocardial infarction approximately three years ago - History of pulmonary embolism treated at [facility]  -= He remains on statin, ASA  Htn - Takes propranolol  60 mg - BP not checked at home, suboptimal control it seems  Dyspnea and cough - Occasional shortness of breath - Slight cough, more pronounced when lying flat - No recent respiratory infections or bronchitis  Tobacco use and smoking cessation - Smokes half a pack of cigarettes per day x 30 years - Attempting to reduce cigarette consumption - Expresses interest in using nicotine  patches, which were effective during a previous hospital stay   Glycemic status - History of prediabetes - Most recent  hemoglobin A1c of 6 - Sensation of incomplete bladder emptying - Increased frequency of urination  Medication management - Has not taken pravastatin , nitroglycerin , or propranolol  for the past three days - Requests refills for these medications     - Patient has a history of gynecomastia and galactorrhea.  Overall past workup unknown.  He does note that he has had pain and palpation of a mass on the right breast.  This has been more bothersome over time.  Underlying cause is not clear and does not seem to be medication related.  Patient also has a history of essential tremor greater in the left than the right.    ASSESSMENT & PLAN  Encounter Diagnoses  Name Primary?   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    Prostate cancer screening    Gynecomastia     Orders Placed This Encounter  Procedures   US  LIMITED ULTRASOUND INCLUDING AXILLA RIGHT BREAST   CBC with Differential/Platelet   Comprehensive metabolic panel with GFR   Hemoglobin A1c   Lipid panel   Urinalysis, Routine w reflex microscopic   FSH/LH   Testosterone Total,Free,Bio, Males   TSH + free T4   Prolactin   PSA   Ambulatory referral to Cardiology    Assessment and Plan    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 Last cardiology evaluation was 1.5 years ago. Requires ongoing surveillance and potential stress testing to monitor for further occlusion or plaque rupture. - Refer to cardiologist for ongoing surveillance and potential stress testing - Refill pravastatin  40 mg, continue ASA - Ensure nitroglycerin  is available for use as needed  Essential hypertension Blood pressure is  elevated at 146/90 mmHg. Currently taking propranolol  for management. Requires monitoring and potential adjustment of antihypertensive therapy. - Refill propranolol  - Monitor blood pressure at home - will eval at fu visit  Hyperlipidemia Managed with pravastatin  40 mg. Requires monitoring of lipid levels to ensure adequate control. - Refill pravastatin  40 mg - Order lipid panel,    Tobacco use disorder with nicotine  withdrawal (planned smoking cessation) Currently smoking approximately half a pack per day but attempting to reduce. Interested in smoking cessation. Previous use of nicotine  patches. Discussed Wellbutrin  to aid cessation and strategies to manage cravings. - Prescribe Wellbutrin  for smoking cessation - Investigate coverage for nicotine  patches - Discuss smoking cessation strategies and potential use of nicotine  pouches or gum  Prediabetes Previously noted hemoglobin A1c of 6.0. Reports increased urination, suggesting elevated blood glucose levels. - Order blood glucose and hemoglobin A1c tests  Gynecomastia, right side, with breast pain and prior discharge Reports right-sided gynecomastia with pain and prior gray discharge. Requires further evaluation to rule out underlying pathology. Discussed potential hormonal causes and need for imaging and blood work. - Order mammogram of the right chest - Include hormone levels in blood work - prolactin, testosterone and TSH.  May need further workup for Hcg and LH/FSH.  He would like reduction if possible.   Chronic hand tremor, left greater than right Reports chronic tremor more pronounced in the left hand.  General Health Maintenance Discussed the importance of exercise and diet. Encouraged participation in [employer] program for cardiovascular and overall health benefits. - Encourage participation in [employer] program - Discuss dietary improvements with focus on fresh vegetables  Follow-Up Plan to follow up in  approximately five weeks. Labs to be completed prior to follow-up visit. Discussed the importance of monitoring for new symptoms and the availability of phone visits if needed. - Schedule follow-up appointment in five weeks - Complete blood work prior to follow-up - Order screening CT of the chest - Order pulmonary function test      Will have patient follow-up with 5 weeks.  His most concerning issue right now is the coronary artery disease.  Discussed with patient if he has any chest pain or exertional shortness of breath reduce concerning symptoms to follow-up with the emergency department.  Follow-up otherwise as needed in the interim.       Objective  BP (!) 146/90 (BP Location: Left Arm, Patient Position: Sitting, Cuff Size: Large)   Pulse (!) 56   Ht 5' 11 (1.803 m)   Wt 199 lb 6 oz (90.4 kg)   SpO2 99%   BMI 27.81 kg/m      Review of Systems  Constitutional:  Negative for chills, fever and weight loss.  Eyes:  Negative for blurred vision. h Respiratory:  Negative for cough and shortness of breath.   Cardiovascular:  Negative for chest pain and palpitations.  Skin:  Negative for rash.  Psychiatric/Behavioral:  Negative for depression. The patient is not nervous/anxious.      Physical Exam Physical Exam Vitals reviewed.  Constitutional:      Appearance: Normal appearance. Well-developed with  normal weight.  HENT:     Head: Normocephalic and atraumatic.  Normal mucous membranes, no oral lesions Eyes:     Pupils: Pupils are equal, round, and reactive to light.  Neck:     Thyroid: No thyroid mass or thyromegaly.  Cardiovascular:     Rate and Rhythm: Normal rate and regular rhythm. Normal heart sounds. Normal peripheral pulses Pulmonary:     Normal breath sounds with normal effort Abdominal:   Abdomen is soft, without tenderness or noted hepatosplenomegaly Musculoskeletal:        General: No swelling or edema  Lymphadenopathy:     Cervical: No cervical  adenopathy.  Skin:    General: Skin is warm and dry without noticeable rash. Neurological:     General: No focal deficit present.  Psychiatric:        Mood and Affect: Mood, behavior and cognition normal   Past Medical History:  Diagnosis Date   Depression 02/15/2014   Hypertension    Pulmonary embolism (HCC)    Sleep disturbance 02/15/2014   Tremors of nervous system    Trochanteric bursitis of left hip 08/30/2018   Past Surgical History:  Procedure Laterality Date   HIP SURGERY Bilateral    TOTAL HIP ARTHROPLASTY Bilateral    Family Status  Relation Name Status   Mother  Deceased   Father  Deceased   Sister  Alive   Sister  Alive   Sister  Alive   Brother  Deceased   Brother  Nature conservation officer   Daughter  Alive   Son  Alive  No partnership data on file   Family History  Problem Relation Age of Onset   Diabetes Mother    Diabetes Father    Healthy Sister    Healthy Sister    Healthy Sister    Lung disease Brother    Healthy Brother    Healthy Brother    Healthy Daughter    Healthy Son    Social History   Socioeconomic History   Marital status: Divorced    Spouse name: Not on file   Number of children: Not on file   Years of education: Not on file   Highest education level: Not on file  Occupational History   Not on file  Tobacco Use   Smoking status: Some Days    Types: Cigarettes   Smokeless tobacco: Never   Tobacco comments:    trying to quit smoking, smokes 3 cigs q mth, uses vapes daily.   Vaping Use   Vaping status: Never Used  Substance and Sexual Activity   Alcohol use: No   Drug use: No   Sexual activity: Not Currently  Other Topics Concern   Not on file  Social History Narrative   Not on file   Social Drivers of Health   Financial Resource Strain: Low Risk  (08/03/2023)   Received from [facility]   Overall Financial Resource Strain (CARDIA)    Difficulty of Paying Living Expenses: Not hard at all  Food Insecurity: No  Food Insecurity (08/03/2023)   Received from [facility]   Hunger Vital Sign    Within the past 12 months, you worried that your food would run out before you got the money to buy more.: Never true    Within the past 12 months, the food you bought just didn't last and you didn't have money to get more.: Never true  Transportation Needs: No Transportation Needs (08/03/2023)  Received from [facility]   PRAPARE - Transportation    Lack of Transportation (Medical): No    Lack of Transportation (Non-Medical): No  Physical Activity: Unknown (03/01/2023)   Received from [facility]   Exercise Vital Sign    On average, how many days per week do you engage in moderate to strenuous exercise (like a brisk walk)?: 0 days    Minutes of Exercise per Session: Not on file  Stress: No Stress Concern Present (03/01/2023)   Received from [facility] - Occupational Stress Questionnaire    Feeling of Stress : Not at all  Social Connections: Socially Integrated (03/01/2023)   Received from [facility]   Social Network    How would you rate your social network (family, work, friends)?: Good participation with social networks   Outpatient Medications Prior to Visit  Medication Sig   aspirin  81 MG chewable tablet Chew 81 mg by mouth daily.   pravastatin  (PRAVACHOL ) 40 MG tablet TAKE 1 TABLET BY MOUTH EVERY DAY FOR CHOLESTEROL   [DISCONTINUED] hydrALAZINE  (APRESOLINE ) 25 MG tablet Take 1 tablet (25 mg total) by mouth 2 (two) times daily.   cefdinir  (OMNICEF ) 300 MG capsule Take 1 capsule (300 mg total) by mouth 2 (two) times daily. (Patient not taking: Reported on 01/14/2024)   nitroGLYCERIN  (NITROSTAT ) 0.4 MG SL tablet Place 0.4 mg under the tongue every 5 (five) minutes as needed for chest pain. (Patient not taking: Reported on 01/14/2024)   No facility-administered medications prior to visit.   Allergies  Allergen Reactions   Bactrim  [Sulfamethoxazole-Trimethoprim] Other (See Comments)    Acute kidney injury   Penicillins     Immunization History  Administered Date(s) Administered   Influenza,inj,Quad PF,6+ Mos 06/13/2018   Influenza-Unspecified 02/15/2014   Pneumococcal Conjugate-13 05/07/2014    Health Maintenance  Topic Date Due   Hepatitis C Screening  Never done   DTaP/Tdap/Td (1 - Tdap) Never done   Colonoscopy  Never done   Zoster Vaccines- Shingrix (1 of 2) Never done   Pneumococcal Vaccine: 50+ Years (2 of 2 - PPSV23, PCV20, or PCV21) 07/02/2014   COVID-19 Vaccine (1 - 2024-25 season) Never done   INFLUENZA VACCINE  12/24/2023   Medicare Annual Wellness (AWV)  02/29/2024   HPV VACCINES  Aged Out   Meningococcal B Vaccine  Aged Out    Patient Care Team: Patient, No Pcp Per as PCP - General (General Practice)  Depression Screen     No data to display           Parris DELENA Juneau, MD  [facility] [phone] (phone) [phone] (fax)  [facility]

## 2024-01-14 NOTE — Telephone Encounter (Signed)
-----   Message from Jesse [name] sent at 01/14/2024  2:06 PM EDT ----- Can we refill his statin and his propranolol ?  thanks

## 2024-01-20 ENCOUNTER — Telehealth: Payer: Self-pay

## 2024-01-20 NOTE — Progress Notes (Signed)
 Care Guide Pharmacy Note  01/20/2024 Name: Jesse [name] MRN: [id number] DOB: 1956/02/09  Referred By: Patient, No Pcp Per Reason for referral: Complex Care Management (Outreach to schedule with pharm d )   Jesse [name] is a 68 y.o. year old male who is a primary care patient of Patient, No Pcp Per.  Jesse [name] was referred to the pharmacist for assistance related to: Med assistance   Successful contact was made with the patient to discuss pharmacy services including being ready for the pharmacist to call at least 5 minutes before the scheduled appointment time and to have medication bottles and any blood pressure readings ready for review. The patient agreed to meet with the pharmacist via telephone visit on (date/time).0829/2025  Jeoffrey Buffalo , RMA     [place]  [facility] Guide  Direct Dial: [phone]  Website: [email or web address]

## 2024-01-21 ENCOUNTER — Other Ambulatory Visit: Payer: Self-pay

## 2024-01-21 ENCOUNTER — Other Ambulatory Visit: Admitting: Pharmacist

## 2024-01-21 DIAGNOSIS — F17200 Nicotine dependence, unspecified, uncomplicated: Secondary | ICD-10-CM

## 2024-01-21 DIAGNOSIS — N62 Hypertrophy of breast: Secondary | ICD-10-CM

## 2024-01-21 DIAGNOSIS — F17210 Nicotine dependence, cigarettes, uncomplicated: Secondary | ICD-10-CM

## 2024-01-21 MED ORDER — NICOTINE 21 MG/24HR TD PT24
21.0000 mg | MEDICATED_PATCH | Freq: Every day | TRANSDERMAL | 3 refills | Status: DC
Start: 2024-01-21 — End: 2024-01-21

## 2024-01-21 MED ORDER — NICOTINE 21 MG/24HR TD PT24: 21.0000 mg | MEDICATED_PATCH | Freq: Every day | TRANSDERMAL | 3 refills | Status: DC

## 2024-01-21 NOTE — Progress Notes (Signed)
 01/21/2024 Name: Jesse [name] MRN: [id number] DOB: 1955-08-12  Chief Complaint  Patient presents with   Medication Management   Medication Assistance    Jesse [name] is a 68 y.o. year old male who presented for a telephone visit.   They were referred to the pharmacist by their PCP for assistance in managing medication access.    Subjective:  Care Team: Primary Care Provider: Everlene Parris LABOR, MD ; Next Scheduled Visit: 03/03/2024  Cardiologist: Mady Bruckner, MD; Next Scheduled Visit: 03/01/2024   Medication Access/Adherence  Current Pharmacy:  [phone] - [place], [place] - [address] [place] [address] Phone: [phone] Fax: [phone]   Patient reports affordability concerns with their medications: Yes  Patient reports access/transportation concerns to their pharmacy: No  Patient reports adherence concerns with their medications:  No    Reports has not yet picked up bupropion  prescription from pharmacy (prescribed by PCP on 01/14/2024 for smoking cessation) due to cost of medication - From review of formulary for patient's plan from [facility] website, appears that bupropion  SR is covered as a tier 3 option with copayment of $47/month and cost may further be impacted by annual deductible of $350 (which impacts tier 3-5 medications)   Tobacco Abuse:  Reports currently smokes ~15 cigarettes/day  Started smoking ~ age 47  Triggers: stress, not working/staying active  Strategies: staying active with his hands (working, puzzles)  Motivation: his health (has noticed not healing as well as in past; not wanting to cause more damage)  Reports family/friends supportive with helping him quit; not wanting to be around him when smoking  Reports previously had success with quitting smoking with nicotine  patches and would like to try these again, if able to afford   Objective:   Lab Results  Component Value  Date   CREATININE 1.04 10/27/2023   BUN 7 (L) 10/27/2023   NA 140 10/27/2023   K 4.4 10/27/2023   CL 107 10/27/2023   CO2 25 10/27/2023    No results found for: CHOL, HDL, LDLCALC, LDLDIRECT, TRIG, CHOLHDL  Current Outpatient Medications on File Prior to Visit  Medication Sig Dispense Refill   aspirin  81 MG chewable tablet Chew 81 mg by mouth daily.     buPROPion  (WELLBUTRIN  SR) 150 MG 12 hr tablet Take 1 tablet (150 mg total) by mouth 2 (two) times daily. (Patient not taking: Reported on 01/21/2024) 90 tablet 1   cefdinir  (OMNICEF ) 300 MG capsule Take 1 capsule (300 mg total) by mouth 2 (two) times daily. (Patient not taking: Reported on 01/14/2024) 14 capsule 0   nitroGLYCERIN  (NITROSTAT ) 0.4 MG SL tablet Place 0.4 mg under the tongue every 5 (five) minutes as needed for chest pain. (Patient not taking: Reported on 01/14/2024)     pravastatin  (PRAVACHOL ) 40 MG tablet Take 1 tablet (40 mg total) by mouth daily. 90 tablet 1   propranolol  ER (INDERAL  LA) 60 MG 24 hr capsule Take 1 capsule (60 mg total) by mouth daily. For tremor 30 capsule 1   No current facility-administered medications on file prior to visit.       Assessment/Plan:   Tobacco Abuse - Currently uncontrolled - Provided motivational interviewing to assess tobacco use and strategies for reduction - Set quit date today: [birth year] (his [birth year]) - Note nicotine  patches not covered through his Medicare plan From review Summary of Benefits for patient's current plan of [facility] Medicare website, does not appear patient has an over the counter benefit with  current plan, but plans to contact [employer] to check Note nicotine  patches available through [facility] using GoodRx for $23.73 for 28 patches. Patient states this is affordable - Collaborate with PCP to ask provider to send prescription for nicotine  patches to pharmacy for patient  Rx for Nicotine  21 mg/day patches sent to [facility] for  patient  Follow up with [facility] to provide GoodRx savings card billing information:  BIN [id number] PCN [employer] GDRX Member ID [id number] [facility] Pharmacist processes and advises cost is $25.64 for 28 day supply - Counsel patient to start nicotine  patch 21 mg daily. Counseled on proper placement and potential side effects, including mild itching/redness at the location site, headache, trouble sleeping and/or vivid dreams. Advised to remove patch at night if development of trouble sleeping.  - Patch Schedule for >10 cigarettes daily: Apply one 21 mg patch daily for 6 weeks. Then, reduce to one 14 mg patch daily for another 2 weeks, if able. Then, reduce to one 7 mg patch for another 2 weeks, if able.    Follow Up Plan: Clinical Pharmacist will follow up with patient by telephone on 02/16/2024 at 11:30 AM   Sharyle Sia, PharmD, [facility] Clinical Pharmacist [facility] [phone]

## 2024-01-21 NOTE — Patient Instructions (Signed)
 Steps to Quit Smoking  Smoking tobacco is the leading cause of preventable death. It can affect almost every organ in the body. Smoking puts you and people around you at risk for many serious, long-lasting (chronic) diseases. Quitting smoking can be hard, but it is one of the best things that you can do for your health. It is never too late to quit.  Do not give up if you cannot quit the first time. Some people need to try many times to quit. Do your best to stick to your quit plan, and talk with your doctor if you have any questions or concerns.  How do I get ready to quit? Pick a date to quit. Set a date within the next 2 weeks to give you time to prepare. Write down the reasons why you are quitting. Keep this list in places where you will see it often. Tell your family, friends, and co-workers that you are quitting. Their support is important. Talk with your doctor about the choices that may help you quit. Find out if your health insurance will pay for these treatments. Know the people, places, things, and activities that make you want to smoke (triggers). Avoid them.  What first steps can I take to quit smoking? Throw away all cigarettes at home, at work, and in your car. Throw away the things that you use when you smoke, such as ashtrays and lighters. Clean your car. Empty the ashtray. Clean your home, including curtains and carpets.  What can I do to help me quit smoking? Talk with your doctor about taking medicines and seeing a counselor. You are more likely to succeed when you do both.   Quit right away Quit smoking completely, instead of slowly cutting back on how much you smoke over a period of time. Stopping smoking right away may be more successful than slowly quitting. Go to counseling. In-person is best if this is an option. You are more likely to quit if you go to counseling sessions regularly.  Take medicine You may take medicines to help you quit. Some medicines need a  prescription, and some you can buy over-the-counter. Some medicines may contain a drug called nicotine  to replace the nicotine  in cigarettes. Medicines may: Help you stop having the desire to smoke (cravings). Help to stop the problems that come when you stop smoking (withdrawal symptoms). Your doctor may ask you to use: Nicotine  patches, gum, or lozenges. Nicotine  inhalers or sprays. Non-nicotine  medicine that you take by mouth.  Find resources Find resources and other ways to help you quit smoking and remain smoke-free after you quit. They include: Online chats with a Veterinary surgeon. Phone quitlines. Printed Materials engineer. Support groups or group counseling. Text messaging programs. Mobile phone apps. Use apps on your mobile phone or tablet that can help you stick to your quit plan. Examples of free services include Quit Guide from the CDC and smokefree.gov   What can I do to make it easier to quit?  Talk to your family and friends. Ask them to support and encourage you. Call a phone quitline, such as 1-800-QUIT-NOW, reach out to support groups, or work with a Veterinary surgeon. Ask people who smoke to not smoke around you. Avoid places that make you want to smoke, such as: Bars. Parties. Smoke-break areas at work. Spend time with people who do not smoke. Lower the stress in your life. Stress can make you want to smoke. Try these things to lower stress: Getting regular exercise. Doing deep-breathing  exercises. Doing yoga. Meditating.  What benefits will I see if I quit smoking? Over time, you may have: A better sense of smell and taste. Less coughing and sore throat. A slower heart rate. Lower blood pressure. Clearer skin. Better breathing. Fewer sick days.  Summary Quitting smoking can be hard, but it is one of the best things that you can do for your health. Do not give up if you cannot quit the first time. Some people need to try many times to quit. When you decide to quit  smoking, make a plan to help you succeed. Quit smoking right away, not slowly over a period of time. When you start quitting, get help and support to keep you smoke-free. This information is not intended to replace advice given to you by your health care provider. Make sure you discuss any questions you have with your health care provider. Document Revised: 05/02/2021 Document Reviewed: 05/02/2021 Elsevier Patient Education  2024 [employer].

## 2024-01-26 ENCOUNTER — Telehealth: Payer: Self-pay | Admitting: Acute Care

## 2024-01-26 DIAGNOSIS — F17210 Nicotine dependence, cigarettes, uncomplicated: Secondary | ICD-10-CM

## 2024-01-26 DIAGNOSIS — Z87891 Personal history of nicotine dependence: Secondary | ICD-10-CM

## 2024-01-26 DIAGNOSIS — Z122 Encounter for screening for malignant neoplasm of respiratory organs: Secondary | ICD-10-CM

## 2024-01-26 NOTE — Telephone Encounter (Signed)
 Lung Cancer Screening Narrative/Criteria Questionnaire (Cigarette Smokers Only- No Cigars/Pipes/vapes)   Jesse [name]   SDMV:02/02/24 at 0900a/[place]                                           20-Jun-1955              LDCT: 02/03/24 at 11am/[facility]    68 y.o.   Phone: [phone]  Lung Screening Narrative (confirm age 59-77 yrs Medicare / 50-80 yrs Private pay insurance)   Insurance information:[employer]   Referring Provider:Safirov   This screening involves an initial phone call with a team member from our program. It is called a shared decision making visit. The initial meeting is required by insurance and Medicare to make sure you understand the program. This appointment takes about 15-20 minutes to complete. The CT scan will completed at a separate date/time. This scan takes about 5-10 minutes to complete and you may eat and drink before and after the scan.  Criteria questions for Lung Cancer Screening:   Are you a current or former smoker? Current Age began smoking: 31y   If you are a former smoker, what year did you quit smoking?NA   To calculate your smoking history, I need an accurate estimate of how many packs of cigarettes you smoked per day and for how many years. (Not just the number of PPD you are now smoking)   Years smoking 36 x Packs per day 1/2 to 3/4 = Pack years 23   (at least 20 pack yrs)   (Make sure they understand that we need to know how much they have smoked in the past, not just the number of PPD they are smoking now)  Do you have a personal history of cancer?  No    Do you have a family history of cancer? No  Are you coughing up blood?  No  Have you had unexplained weight loss of 15 lbs or more in the last 6 months? No  It looks like you meet all criteria.     Additional information: N/A

## 2024-02-01 ENCOUNTER — Ambulatory Visit
Admission: RE | Admit: 2024-02-01 | Discharge: 2024-02-01 | Disposition: A | Discharge status: Home / Self Care | Source: Ambulatory Visit

## 2024-02-01 ENCOUNTER — Ambulatory Visit: Payer: Self-pay

## 2024-02-01 DIAGNOSIS — N62 Hypertrophy of breast: Secondary | ICD-10-CM | POA: Insufficient documentation

## 2024-02-02 ENCOUNTER — Ambulatory Visit

## 2024-02-02 ENCOUNTER — Encounter: Payer: Self-pay | Admitting: Internal Medicine

## 2024-02-02 ENCOUNTER — Encounter: Payer: Self-pay | Admitting: *Deleted

## 2024-02-02 ENCOUNTER — Ambulatory Visit: Attending: Internal Medicine | Admitting: Internal Medicine

## 2024-02-02 VITALS — BP 140/100 | HR 62 | Ht 71.0 in | Wt 196.2 lb

## 2024-02-02 DIAGNOSIS — E785 Hyperlipidemia, unspecified: Secondary | ICD-10-CM

## 2024-02-02 DIAGNOSIS — I2510 Atherosclerotic heart disease of native coronary artery without angina pectoris: Secondary | ICD-10-CM | POA: Diagnosis not present

## 2024-02-02 DIAGNOSIS — I25118 Atherosclerotic heart disease of native coronary artery with other forms of angina pectoris: Secondary | ICD-10-CM

## 2024-02-02 DIAGNOSIS — I159 Secondary hypertension, unspecified: Secondary | ICD-10-CM

## 2024-02-02 DIAGNOSIS — F17210 Nicotine dependence, cigarettes, uncomplicated: Secondary | ICD-10-CM | POA: Diagnosis not present

## 2024-02-02 DIAGNOSIS — Z86711 Personal history of pulmonary embolism: Secondary | ICD-10-CM

## 2024-02-02 DIAGNOSIS — Z720 Tobacco use: Secondary | ICD-10-CM

## 2024-02-02 DIAGNOSIS — I10 Essential (primary) hypertension: Secondary | ICD-10-CM

## 2024-02-02 MED ORDER — NITROGLYCERIN 0.4 MG SL SUBL: 0.4000 mg | SUBLINGUAL_TABLET | SUBLINGUAL | 3 refills | Status: DC | PRN

## 2024-02-02 MED ORDER — AMLODIPINE BESYLATE 5 MG PO TABS
5.0000 mg | ORAL_TABLET | Freq: Every day | ORAL | 3 refills | Status: DC
Start: 2024-02-02 — End: 2024-03-15

## 2024-02-02 NOTE — Progress Notes (Unsigned)
 Cardiology Office Note:  .   Date:  02/03/2024  ID:  Jesse [name], DOB 1956/02/23, MRN [id number] PCP: Everlene Parris LABOR, MD  [facility] Providers Cardiologist:  None     History of Present Illness: Jesse   Jesse [name] is a 68 y.o. male with history of coronary artery disease with CTO of LCx, pulmonary embolism (appears to be unprovoked though details are unclear), hypertension, hyperlipidemia, prediabetes, and tobacco use, who has been referred for evaluation of coronary artery disease.  Today, [name] reports that he has been feeling fairly well.  He notes that he gets out of breath when he walks about half a mile and needs to rest for a few minutes.  He also feels like his energy is drained at that point.  He notes some vague chest discomfort at other times, almost as if he needs to cough, though he has not had any frank chest pain.  All of the symptoms have been present for at least a year.  He notes that he had had more pronounced symptoms before he began exercising at the [facility], though regular exercise had helped him feel better.  Unfortunately, he has not been able to go to the gym regularly due to changes in his insurance.  He hopes to get back into this again.  His initial diagnosis of CAD was made in 12/2021 when he was admitted with left-sided back and shoulder pain at [facility]  Catheterization at that time showed chronic total occlusion of the LCx with elevated troponin felt to be due to supply-demand mismatch.  [name] only other concern is of intermittent ringing and buzzing in his ears as well as the sensation that things sometimes seem to get stuck in his throat.  He wonders if he has dysphagia.  He had previously been on amlodipine  to control his blood pressure though it appears this may have been stopped due to suboptimal blood pressure control.  He does not recall any specific side effects to this medication.  ROS: See HPI  Studies Reviewed: Jesse   EKG  Interpretation Date/Time:  Wednesday February 02 2024 15:27:12 EDT Ventricular Rate:  62 PR Interval:  164 QRS Duration:  92 QT Interval:  436 QTC Calculation: 442 R Axis:   18  Text Interpretation: Normal sinus rhythm Normal ECG When compared with ECG of 14-Sep-2023 19:18, No significant change was found Confirmed by Kolbie Clarkston ([id number]) on 02/02/2024 3:31:48 PM    Risk Assessment/Calculations:     HYPERTENSION CONTROL Vitals:   02/02/24 1521 02/02/24 1526  BP: (!) 140/100 (!) 140/100    The patient's blood pressure is elevated above target today.  In order to address the patient's elevated BP: A new medication was prescribed today.          Physical Exam:   VS:  BP (!) 140/100 (BP Location: Right Arm, Cuff Size: Normal)   Pulse 62   Ht 5' 11 (1.803 m)   Wt 196 lb 4 oz (89 kg)   SpO2 98%   BMI 27.37 kg/m    Wt Readings from Last 3 Encounters:  02/02/24 196 lb 4 oz (89 kg)  01/14/24 199 lb 6 oz (90.4 kg)  10/27/23 217 lb (98.4 kg)    General:  NAD. Neck: No JVD or HJR. Lungs: Clear to auscultation bilaterally without wheezes or crackles. Heart: Regular rate and rhythm without murmurs, rubs, or gallops. Abdomen: Soft, nontender, nondistended. Extremities: No lower extremity edema.  ASSESSMENT AND PLAN: .  Coronary artery disease with stable angina and hyperlipidemia: Jesse [name] reports sporadic vague chest discomfort, mostly with the sensation of needing to cough, as well as chronic exertional dyspnea that has been present for couple of years.  I think he would benefit most from aggressive medical therapy and improved blood pressure control.  We have agreed to resume amlodipine  5 mg daily and continue his current regimen of aspirin , pravastatin , and propranolol .  [name] scheduled for follow-up labs later this month; if his LDL is above 70, I would favor switching to a high intensity statin such as atorvastatin or rosuvastatin.  Hypertension: Blood pressure  moderately elevated today.  We will resume amlodipine  5 mg daily.  Continue propranolol .  History of pulmonary embolism: Circumstances are unclear though it sounds like this was unprovoked.  [name] not on anticoagulation at this time.  I will defer further management to Jesse [name]'s PCP.  Tobacco abuse: Jesse [name] is in the process of trying to quit, which I encouraged him to keep working on.    Dispo: Return to clinic in 6 weeks.  Signed, Lonni Hanson, MD

## 2024-02-02 NOTE — Patient Instructions (Signed)
 Medication Instructions:  Your physician recommends the following medication changes.  START TAKING: Amlodipine  5 mg by mouth daily   *If you need a refill on your cardiac medications before your next appointment, please call your pharmacy*  Lab Work: No labs ordered today    Testing/Procedures: No test ordered today   Follow-Up: At [facility], you and your health needs are our priority.  As part of our continuing mission to provide you with exceptional heart care, our providers are all part of one team.  This team includes your primary Cardiologist (physician) and Advanced Practice Providers or APPs (Physician Assistants and Nurse Practitioners) who all work together to provide you with the care you need, when you need it.  Your next appointment:   6 week(s)  Provider:   You may see Lonni Hanson, MD or one of the following Advanced Practice Providers on your designated Care Team:   Lonni Meager, NP Lesley Maffucci, PA-C Bernardino Bring, PA-C Cadence Moore, PA-C Tylene Lunch, NP Barnie Hila, NP

## 2024-02-02 NOTE — Progress Notes (Signed)
  Virtual Visit via Telephone Note  I connected with [name] on 02/02/24 at  9:00 AM EDT by telephone and verified that I am speaking with the correct person using two identifiers.  Location: Patient: at home Provider: [address], [place], [place], [address]    I discussed the limitations, risks, security and privacy concerns of performing an evaluation and management service by telephone and the availability of in person appointments. I also discussed with the patient that there may be a patient responsible charge related to this service. The patient expressed understanding and agreed to proceed.  Shared Decision Making Visit Lung Cancer Screening Program [phone])   Eligibility: Age 68 y.o. Pack Years Smoking History Calculation 23 (# packs/per year x # years smoked) Recent History of coughing up blood  no Unexplained weight loss? no ( >Than 15 pounds within the last 6 months ) Prior History Lung / other cancer no (Diagnosis within the last 5 years already requiring surveillance chest CT Scans). Smoking Status Current Smoker Former Smokers: Years since quit: n/a  Quit Date: n/a  Visit Components: Discussion included one or more decision making aids. yes Discussion included risk/benefits of screening. yes Discussion included potential follow up diagnostic testing for abnormal scans. yes Discussion included meaning and risk of over diagnosis. yes Discussion included meaning and risk of False Positives. yes Discussion included meaning of total radiation exposure. yes  Counseling Included: Importance of adherence to annual lung cancer LDCT screening. yes Impact of comorbidities on ability to participate in the program. yes Ability and willingness to under diagnostic treatment. yes  Smoking Cessation Counseling: Current Smokers:  Discussed importance of smoking cessation. yes Information about tobacco cessation classes and interventions provided to patient.  yes Patient provided with ticket for LDCT Scan. no Symptomatic Patient. no  Counseling(Intermediate counseling: > three minutes) [address]406 Diagnosis Code: Tobacco Use Z72.0 Asymptomatic Patient yes  Counseling:  Counseled patient 4 minutes regarding tobacco use.   Former Smokers:  Discussed the importance of maintaining cigarette abstinence. yes Diagnosis Code: Personal History of Nicotine  Dependence. S12.108 Information about tobacco cessation classes and interventions provided to patient. Yes Patient provided with ticket for LDCT Scan. no Written Order for Lung Cancer Screening with LDCT placed in Epic. Yes (CT Chest Lung Cancer Screening Low Dose W/O CM) [id number] Z12.2-Screening of respiratory organs Z87.891-Personal history of nicotine  dependence   Laneta Speaks, RN

## 2024-02-02 NOTE — Patient Instructions (Signed)

## 2024-02-03 ENCOUNTER — Ambulatory Visit
Admission: RE | Admit: 2024-02-03 | Discharge: 2024-02-03 | Disposition: A | Discharge status: Home / Self Care | Source: Ambulatory Visit | Attending: Acute Care | Admitting: Acute Care

## 2024-02-03 DIAGNOSIS — I2510 Atherosclerotic heart disease of native coronary artery without angina pectoris: Secondary | ICD-10-CM | POA: Insufficient documentation

## 2024-02-03 DIAGNOSIS — F17210 Nicotine dependence, cigarettes, uncomplicated: Secondary | ICD-10-CM | POA: Insufficient documentation

## 2024-02-03 DIAGNOSIS — Z122 Encounter for screening for malignant neoplasm of respiratory organs: Secondary | ICD-10-CM | POA: Diagnosis present

## 2024-02-03 DIAGNOSIS — J479 Bronchiectasis, uncomplicated: Secondary | ICD-10-CM | POA: Insufficient documentation

## 2024-02-03 DIAGNOSIS — J439 Emphysema, unspecified: Secondary | ICD-10-CM | POA: Insufficient documentation

## 2024-02-03 DIAGNOSIS — R911 Solitary pulmonary nodule: Secondary | ICD-10-CM | POA: Diagnosis not present

## 2024-02-03 DIAGNOSIS — Z87891 Personal history of nicotine dependence: Secondary | ICD-10-CM

## 2024-02-09 ENCOUNTER — Other Ambulatory Visit: Payer: Self-pay

## 2024-02-09 DIAGNOSIS — N62 Hypertrophy of breast: Secondary | ICD-10-CM

## 2024-02-09 DIAGNOSIS — G250 Essential tremor: Secondary | ICD-10-CM

## 2024-02-09 DIAGNOSIS — I159 Secondary hypertension, unspecified: Secondary | ICD-10-CM

## 2024-02-09 DIAGNOSIS — F17210 Nicotine dependence, cigarettes, uncomplicated: Secondary | ICD-10-CM

## 2024-02-09 DIAGNOSIS — I25118 Atherosclerotic heart disease of native coronary artery with other forms of angina pectoris: Secondary | ICD-10-CM

## 2024-02-09 DIAGNOSIS — Z86711 Personal history of pulmonary embolism: Secondary | ICD-10-CM

## 2024-02-09 DIAGNOSIS — R739 Hyperglycemia, unspecified: Secondary | ICD-10-CM

## 2024-02-09 DIAGNOSIS — Z125 Encounter for screening for malignant neoplasm of prostate: Secondary | ICD-10-CM

## 2024-02-14 ENCOUNTER — Other Ambulatory Visit: Payer: Self-pay | Admitting: Acute Care

## 2024-02-14 ENCOUNTER — Telehealth: Payer: Self-pay | Admitting: *Deleted

## 2024-02-14 ENCOUNTER — Telehealth: Payer: Self-pay | Admitting: Acute Care

## 2024-02-14 DIAGNOSIS — R911 Solitary pulmonary nodule: Secondary | ICD-10-CM

## 2024-02-14 NOTE — Telephone Encounter (Signed)
 Call report from [facility] Radiology:  IMPRESSION: 1. 25.4 mm left lower lobe nodule with distal mucoid impaction. Lung-RADS 4B, suspicious. Additional imaging evaluation or consultation with Pulmonology or Thoracic Surgery recommended. These results will be called to the ordering clinician or representative by the Radiologist Assistant, and communication documented in the PACS or [employer]. 2. Mild cylindrical bronchiectasis. 3. Three-vessel coronary artery calcification. 4.  Emphysema (ICD10-J43.9).

## 2024-02-14 NOTE — Telephone Encounter (Signed)
 Results/ plans faxed to PCP. Reminder placed to follow for PET results and follow up.

## 2024-02-14 NOTE — Telephone Encounter (Signed)
 I have called the patient with the results of his low dose CT Chest. The scan was read as a LR 4 B. The patient has a 25.4 mm LLL lung nodule that was 1.3 mm in 2017. We will do a PET scan and patient will follow up in the office with Dr. Shelah or myself to review the results. He agrees with the plan and had no further questions at completion of the call.   Please let PCP know plan is for a PET scan and follow up with Pulmonology. Thanks so much

## 2024-02-15 ENCOUNTER — Other Ambulatory Visit: Payer: Self-pay

## 2024-02-15 ENCOUNTER — Telehealth: Payer: Self-pay

## 2024-02-15 NOTE — Telephone Encounter (Signed)
errep

## 2024-02-15 NOTE — Telephone Encounter (Signed)
 See other telephone note from 02/14/24

## 2024-02-16 ENCOUNTER — Other Ambulatory Visit: Admitting: Pharmacist

## 2024-02-16 DIAGNOSIS — F17210 Nicotine dependence, cigarettes, uncomplicated: Secondary | ICD-10-CM

## 2024-02-16 NOTE — Progress Notes (Signed)
 02/16/2024 Name: Jesse [name] MRN: [id number] DOB: [birth year]  Chief Complaint  Patient presents with   Medication Management    Jesse [name] is a 68 y.o. year old male who presented for a telephone visit.   They were referred to the pharmacist by their PCP for assistance in managing medication access.      Subjective:   Care Team: Primary Care Provider: Everlene Parris LABOR, MD ; Next Scheduled Visit: 03/03/2024  Cardiologist: Mady Bruckner, MD; Next Scheduled Visit: 03/15/2024  Pulmonology: Ruthell Lauraine FALCON, NP; Next Scheduled Visit: 02/29/2024  PET scan scheduled for 02/18/2024  Medication Access/Adherence  Current Pharmacy:  [facility] [phone] - [place] - [address] [place] [address] Phone: [phone] Fax: [phone]  [id number] [place], [place] - [address] AT [facility] OF [address] [place] [address] Phone: [phone] Fax: [phone]   Patient reports affordability concerns with their medications: No  Patient reports access/transportation concerns to their pharmacy: No  Patient reports adherence concerns with their medications:  No     From review of chart, note PCP office has attempted to reach patient to request he return to see PCP for a sooner appointment to review results of his recent CT from 02/03/2024   Tobacco Abuse:   Reports has not yet quit, but has cut back to 4-5 cigarettes/day Purchased a pack of cigarettes on Saturday and has decided that this is the last pack that he will purchase  Current medications:  - bupropion  SR 150 mg twice daily - Reports also has nicotine  21 mg patches, but not sure if needs to uses these (has only used 4 so far) as doing well with just the bupropion   Reports since started bupropion  cigarettes "don't taste right"   Started smoking ~ age 58   Triggers: stress, not working/staying active   Strategies: staying active  with his hands (working, puzzles)   Motivation: his health, including recent lung CT results   Reports family/friends supportive with helping him quit; not wanting to be around him when smoking   Reports previously had success with quitting smoking with nicotine  patches and would like to try these again, if able to afford   Objective:   Lab Results  Component Value Date   CREATININE 1.04 10/27/2023   BUN 7 (L) 10/27/2023   NA 140 10/27/2023   K 4.4 10/27/2023   CL 107 10/27/2023   CO2 25 10/27/2023    Current Outpatient Medications on File Prior to Visit  Medication Sig Dispense Refill   buPROPion  (WELLBUTRIN  SR) 150 MG 12 hr tablet Take 1 tablet (150 mg total) by mouth 2 (two) times daily. 90 tablet 1   amLODipine  (NORVASC ) 5 MG tablet Take 1 tablet (5 mg total) by mouth daily. 90 tablet 3   aspirin  81 MG chewable tablet Chew 81 mg by mouth daily.     nicotine  (NICODERM CQ  - DOSED IN MG/24 HOURS) 21 mg/24hr patch Place 1 patch (21 mg total) onto the skin daily. (Patient not taking: Reported on 02/02/2024) 90 patch 3   nitroGLYCERIN  (NITROSTAT ) 0.4 MG SL tablet Place 1 tablet (0.4 mg total) under the tongue every 5 (five) minutes as needed for chest pain. 25 tablet 3   pravastatin  (PRAVACHOL ) 40 MG tablet Take 1 tablet (40 mg total) by mouth daily. 90 tablet 1   propranolol  ER (INDERAL  LA) 60 MG 24 hr capsule Take 1  capsule (60 mg total) by mouth daily. For tremor 30 capsule 1   No current facility-administered medications on file prior to visit.       Assessment/Plan:   Recommend patient contact office today to scheduler sooner appointment as requested by PCP in order to review results of his recent CT from 02/03/2024  Provide office phone number and patient states will call now  Also encourage patient to contact MyChart support number for assistance with getting back into his MyChart account  Tobacco Abuse - Currently uncontrolled - Provided motivational interviewing to  assess tobacco use and strategies for reduction - Set new quit date today: 1956-03-16    Follow Up Plan: Clinical Pharmacist will follow up with patient by telephone on ***    Sharyle Sia, PharmD, [facility] Clinical Pharmacist [facility] [phone]

## 2024-02-17 ENCOUNTER — Ambulatory Visit (INDEPENDENT_AMBULATORY_CARE_PROVIDER_SITE_OTHER)

## 2024-02-17 ENCOUNTER — Other Ambulatory Visit

## 2024-02-17 VITALS — BP 132/76 | HR 58 | Ht 71.0 in | Wt 194.0 lb

## 2024-02-17 DIAGNOSIS — I25118 Atherosclerotic heart disease of native coronary artery with other forms of angina pectoris: Secondary | ICD-10-CM | POA: Diagnosis not present

## 2024-02-17 DIAGNOSIS — Z125 Encounter for screening for malignant neoplasm of prostate: Secondary | ICD-10-CM

## 2024-02-17 DIAGNOSIS — I159 Secondary hypertension, unspecified: Secondary | ICD-10-CM | POA: Diagnosis not present

## 2024-02-17 DIAGNOSIS — R739 Hyperglycemia, unspecified: Secondary | ICD-10-CM

## 2024-02-17 DIAGNOSIS — F17210 Nicotine dependence, cigarettes, uncomplicated: Secondary | ICD-10-CM

## 2024-02-17 DIAGNOSIS — Z86711 Personal history of pulmonary embolism: Secondary | ICD-10-CM

## 2024-02-17 DIAGNOSIS — R911 Solitary pulmonary nodule: Secondary | ICD-10-CM

## 2024-02-17 DIAGNOSIS — N62 Hypertrophy of breast: Secondary | ICD-10-CM

## 2024-02-17 DIAGNOSIS — G250 Essential tremor: Secondary | ICD-10-CM

## 2024-02-17 NOTE — Progress Notes (Signed)
 Progress Note  Physician: Alailah Safley A Myrth Dahan, MD   HPI: Jesse [name] is a 68 y.o. male presenting on 02/17/2024 for Medical Management of Chronic Issues .  Discussed the use of AI scribe software for clinical note transcription with the patient, who gave verbal consent to proceed.  History of Present Illness   Jesse [name] is a 68 year old male with a lung nodule who presents for follow-up and further testing.  Pulmonary nodule - Lung nodule identified on prior CT scan - Currently undergoing follow-up with PET tomorrow.   - Nodule BiRads4 query malignancy  Coronary artery disease - History of coronary artery disease with prior occlusion - Takes aspirin  daily - Monitors blood pressure at home, which has decreased recently - today 132/76  - Cardiology has seen - no further testing  History of venous thromboembolism - History of blood clot, possibly in 2017 or 2018, etiology unclear - No history of prolonged travel or immobility at the time of clot - Received blood thinner post-hip operation, but not since - No recurrent clots since  Gynecomastia-associated discomfort - Experiencing breast soreness and discomfort - Considering breast reduction due to symptoms   - US  completed which was unremarkable        Medical history:  Relevant past medical, surgical, family and social history reviewed and updated as indicated. Interim medical history since our last visit reviewed.  Allergies and medications reviewed and updated.   ROS: Negative unless specifically indicated above in HPI.    Current Outpatient Medications:    amLODipine  (NORVASC ) 5 MG tablet, Take 1 tablet (5 mg total) by mouth daily., Disp: 90 tablet, Rfl: 3   aspirin  81 MG chewable tablet, Chew 81 mg by mouth daily., Disp: , Rfl:    buPROPion  (WELLBUTRIN  SR) 150 MG 12 hr tablet, Take 1 tablet (150 mg total) by mouth 2 (two) times daily., Disp: 90 tablet, Rfl: 1   nicotine  (NICODERM CQ  - DOSED IN  MG/24 HOURS) 21 mg/24hr patch, Place 1 patch (21 mg total) onto the skin daily., Disp: 90 patch, Rfl: 3   nitroGLYCERIN  (NITROSTAT ) 0.4 MG SL tablet, Place 1 tablet (0.4 mg total) under the tongue every 5 (five) minutes as needed for chest pain., Disp: 25 tablet, Rfl: 3   pravastatin  (PRAVACHOL ) 40 MG tablet, Take 1 tablet (40 mg total) by mouth daily., Disp: 90 tablet, Rfl: 1   propranolol  ER (INDERAL  LA) 60 MG 24 hr capsule, Take 1 capsule (60 mg total) by mouth daily. For tremor, Disp: 30 capsule, Rfl: 1       Objective:     BP 132/76 (BP Location: Left Arm, Patient Position: Sitting, Cuff Size: Normal)   Pulse (!) 58   Ht 5' 11 (1.803 m)   Wt 194 lb (88 kg)   SpO2 98%   BMI 27.06 kg/m   Wt Readings from Last 3 Encounters:  02/17/24 194 lb (88 kg)  02/02/24 196 lb 4 oz (89 kg)  01/14/24 199 lb 6 oz (90.4 kg)    Physical Exam  Physical Exam Vitals reviewed.  Constitutional:      Appearance: Normal appearance. Well-developed with normal weight.  Cardiovascular:     Rate and Rhythm: Normal rate and regular rhythm. Normal heart sounds. Normal peripheral pulses Pulmonary:     Normal breath sounds with normal effort Skin:    General: Skin is warm and dry without noticeable rash. Neurological:     General:  No focal deficit present.  Psychiatric:        Mood and Affect: Mood, behavior and cognition normal      Assessment & Plan:   Encounter Diagnoses  Name Primary?   Coronary artery disease of native artery of native heart with stable angina pectoris Yes   Secondary hypertension    Pulmonary nodule, left     No orders of the defined types were placed in this encounter.    Assessment and Plan    Pulmonary nodule, left lower lobe Pulmonary nodule identified on CT scan, PET scan scheduled to assess for malignancy. - Perform PET scan to evaluate the pulmonary nodule. - Monitor PET scan results and determine further management based on findings. - Consider referral  to pulmonologist if PET scan indicates concern.  - If positive result, he should be on anticoagulation given history of DVT  Venous thromboembolism Venous thromboembolism with unclear etiology, potential need for anticoagulation depending on PET scan results. - Continue daily aspirin . - Evaluate the need for anticoagulation based on PET scan results and further assessment.  - It is unclear clinical scenario  Coronary artery disease with prior occlusion Coronary artery disease with prior occlusion, occasional shortness of breath and discomfort reported. - Monitor for new or worsening symptoms such as chest pain or shortness of breath. - Consider stress test if symptoms suggest cardiac involvement.  Palpitations (extra heartbeats) Experiencing extra heartbeats, previous EKG findings not concerning. - Monitor for changes in palpitations or new symptoms.  Hypertension Blood pressure is borderline at 132/76 mmHg. - Continue home blood pressure monitoring. - Reassess blood pressure management as needed.  <120 would be ideal for systolic pressures    Labs today and will fu with patient after lab results complete

## 2024-02-17 NOTE — Progress Notes (Signed)
 Spoke with patient appointment scheduled

## 2024-02-18 ENCOUNTER — Encounter
Admission: RE | Admit: 2024-02-18 | Discharge: 2024-02-18 | Disposition: A | Discharge status: Home / Self Care | Source: Ambulatory Visit | Attending: Acute Care | Admitting: Acute Care

## 2024-02-18 DIAGNOSIS — R911 Solitary pulmonary nodule: Secondary | ICD-10-CM | POA: Insufficient documentation

## 2024-02-18 LAB — URINALYSIS, ROUTINE W REFLEX MICROSCOPIC
Bilirubin Urine: NEGATIVE
Glucose, UA: NEGATIVE
Ketones, ur: NEGATIVE
Nitrite: POSITIVE — AB
Specific Gravity, Urine: 1.01 (ref 1.001–1.035)
Squamous Epithelial / HPF: NONE SEEN /HPF (ref <=5)
WBC, UA: >=60 /HPF — AB (ref 0–5)
pH: 5.5 (ref 5.0–8.0)

## 2024-02-18 LAB — COMPREHENSIVE METABOLIC PANEL WITH GFR
AG Ratio: 1.3 (calc) (ref 1.0–2.5)
ALT: 9 U/L (ref 9–46)
AST: 17 U/L (ref 10–35)
Albumin: 4.2 g/dL (ref 3.6–5.1)
Alkaline phosphatase (APISO): 63 U/L (ref 35–144)
BUN: 9 mg/dL (ref 7–25)
CO2: 25 mmol/L (ref 20–32)
Calcium: 9.2 mg/dL (ref 8.6–10.3)
Chloride: 107 mmol/L (ref 98–110)
Creat: 1.14 mg/dL (ref 0.70–1.35)
Globulin: 3.2 g/dL (ref 1.9–3.7)
Glucose, Bld: 97 mg/dL (ref 65–99)
Potassium: 4.2 mmol/L (ref 3.5–5.3)
Sodium: 140 mmol/L (ref 135–146)
Total Bilirubin: 0.5 mg/dL (ref 0.2–1.2)
Total Protein: 7.4 g/dL (ref 6.1–8.1)
eGFR: 70 mL/min/1.73m2 (ref >=60)

## 2024-02-18 LAB — LIPID PANEL
Cholesterol: 136 mg/dL (ref <200)
HDL: 29 mg/dL — ABNORMAL LOW (ref >=40)
LDL Cholesterol (Calc): 82 mg/dL
Non-HDL Cholesterol (Calc): 107 mg/dL (ref <130)
Total CHOL/HDL Ratio: 4.7 (calc) (ref <5.0)
Triglycerides: 155 mg/dL — ABNORMAL HIGH (ref <150)

## 2024-02-18 LAB — HEMOGLOBIN A1C
Hgb A1c MFr Bld: 5.9 % — ABNORMAL HIGH (ref <5.7)
Mean Plasma Glucose: 123 mg/dL
eAG (mmol/L): 6.8 mmol/L

## 2024-02-18 LAB — CBC WITH DIFFERENTIAL/PLATELET
Absolute Lymphocytes: 2332 {cells}/uL (ref 850–3900)
Absolute Monocytes: 458 {cells}/uL (ref 200–950)
Basophils Absolute: 29 {cells}/uL (ref 0–200)
Basophils Relative: 0.5 %
Eosinophils Absolute: 197 {cells}/uL (ref 15–500)
Eosinophils Relative: 3.4 %
HCT: 43.4 % (ref 38.5–50.0)
Hemoglobin: 14.4 g/dL (ref 13.2–17.1)
MCH: 29.9 pg (ref 27.0–33.0)
MCHC: 33.2 g/dL (ref 32.0–36.0)
MCV: 90 fL (ref 80.0–100.0)
MPV: 10.7 fL (ref 7.5–12.5)
Monocytes Relative: 7.9 %
Neutro Abs: 2784 {cells}/uL (ref 1500–7800)
Neutrophils Relative %: 48 %
Platelets: 226 Thousand/uL (ref 140–400)
RBC: 4.82 Million/uL (ref 4.20–5.80)
RDW: 13.2 % (ref 11.0–15.0)
Total Lymphocyte: 40.2 %
WBC: 5.8 Thousand/uL (ref 3.8–10.8)

## 2024-02-18 LAB — FSH/LH
FSH: 3.6 m[IU]/mL (ref 1.4–12.8)
LH: 2.2 m[IU]/mL (ref 1.6–15.2)

## 2024-02-18 LAB — TESTOSTERONE TOTAL,FREE,BIO, MALES
Albumin: 4.2 g/dL (ref 3.6–5.1)
Sex Hormone Binding: 34 nmol/L (ref 22–77)
Testosterone, Bioavailable: 80.8 ng/dL — ABNORMAL LOW (ref 110.0–575.0)
Testosterone, Free: 42 pg/mL — ABNORMAL LOW (ref 46.0–224.0)
Testosterone: 330 ng/dL (ref 250–827)

## 2024-02-18 LAB — TSH+FREE T4: TSH W/REFLEX TO FT4: 2.05 m[IU]/L (ref 0.40–4.50)

## 2024-02-18 LAB — PROLACTIN: Prolactin: 6 ng/mL (ref 2.0–18.0)

## 2024-02-18 LAB — PSA: PSA: 1.24 ng/mL (ref <=4.00)

## 2024-02-18 LAB — MICROSCOPIC MESSAGE

## 2024-02-18 NOTE — Patient Instructions (Signed)
 Please feel free to reach out to me in the future with any medication related questions or concerns!  Thank you!  Sharyle Sia, PharmD, [facility] Clinical Pharmacist [facility] [phone]

## 2024-02-21 ENCOUNTER — Ambulatory Visit
Admission: RE | Admit: 2024-02-21 | Discharge: 2024-02-21 | Disposition: A | Discharge status: Home / Self Care | Source: Ambulatory Visit | Attending: Acute Care | Admitting: Acute Care

## 2024-02-21 DIAGNOSIS — I700 Atherosclerosis of aorta: Secondary | ICD-10-CM | POA: Insufficient documentation

## 2024-02-21 DIAGNOSIS — R911 Solitary pulmonary nodule: Secondary | ICD-10-CM | POA: Diagnosis present

## 2024-02-21 LAB — GLUCOSE, CAPILLARY: Glucose-Capillary: 97 mg/dL (ref 70–99)

## 2024-02-21 MED ORDER — FLUDEOXYGLUCOSE F - 18 (FDG) INJECTION
10.7600 | Freq: Once | INTRAVENOUS | Status: AC | PRN
Start: 2024-02-21 — End: 2024-02-21
  Administered 2024-02-21: 10.76 via INTRAVENOUS

## 2024-02-23 ENCOUNTER — Ambulatory Visit
Admission: RE | Admit: 2024-02-23 | Discharge: 2024-02-23 | Disposition: A | Discharge status: Home / Self Care | Source: Ambulatory Visit

## 2024-02-23 ENCOUNTER — Ambulatory Visit (INDEPENDENT_AMBULATORY_CARE_PROVIDER_SITE_OTHER)

## 2024-02-23 VITALS — BP 150/90 | HR 56 | Ht 71.0 in | Wt 194.6 lb

## 2024-02-23 DIAGNOSIS — Z23 Encounter for immunization: Secondary | ICD-10-CM | POA: Diagnosis not present

## 2024-02-23 DIAGNOSIS — R911 Solitary pulmonary nodule: Secondary | ICD-10-CM | POA: Diagnosis not present

## 2024-02-23 DIAGNOSIS — Z86718 Personal history of other venous thrombosis and embolism: Secondary | ICD-10-CM | POA: Diagnosis not present

## 2024-02-23 DIAGNOSIS — R1313 Dysphagia, pharyngeal phase: Secondary | ICD-10-CM | POA: Insufficient documentation

## 2024-02-23 DIAGNOSIS — R1310 Dysphagia, unspecified: Secondary | ICD-10-CM | POA: Insufficient documentation

## 2024-02-23 DIAGNOSIS — I159 Secondary hypertension, unspecified: Secondary | ICD-10-CM | POA: Diagnosis not present

## 2024-02-23 MED ORDER — APIXABAN 2.5 MG PO TABS: 2.5000 mg | ORAL_TABLET | Freq: Two times a day (BID) | ORAL | 1 refills | Status: DC

## 2024-02-23 MED ORDER — SODIUM CHLORIDE 0.9 % IV SOLN: INTRAVENOUS | Status: DC

## 2024-02-23 MED ORDER — IOHEXOL 300 MG/ML  SOLN
75.0000 mL | Freq: Once | INTRAMUSCULAR | Status: AC | PRN
Start: 2024-02-23 — End: 2024-02-23
  Administered 2024-02-23: 75 mL via INTRAVENOUS

## 2024-02-23 NOTE — Progress Notes (Signed)
 Progress Note  Physician: Audrinna Sherman A Hendrick Pavich, MD   HPI: Jesse [name] is a 68 y.o. male presenting on 02/23/2024 for Follow-up .  Discussed the use of AI scribe software for clinical note transcription with the patient, who gave verbal consent to proceed.  History of Present Illness   Jesse [name] is a 68 year old male with a pulmonary nodule who presents for follow-up evaluation.  Pulmonary nodule and hypermetabolic lesions - Pulmonary nodule initially identified on CT chest scan for lung cancer screening - Nodule measures 25.4 mm in the lower left lobe on subsequent imaging by PET - PET scan demonstrates increased uptake in the nodule - Intensely hypermetabolic focus present along the midline mucosa of the posterior nasopharynx  - Question of contralateral nodule hypermetabolic uptake as well  Dysphagia - Difficulty swallowing for several years - Sensation of a 'knot' in the throat that intermittently obstructs swallowing - Occasional regurgitation of water - First noted in 2017 during an emergency room visit - No imaging or further evaluation since initial presentation  Venous thromboembolism - Blood clot occurred in 2019 without a clear provoking factor - 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  Reports BO at home in the 120's systolic with normal diastolic pressures    Medical history:  Relevant past medical, surgical, family and social history reviewed and updated as indicated. Interim medical history since our last visit  reviewed.  Allergies and medications reviewed and updated.   ROS: Negative unless specifically indicated above in HPI.    Current Outpatient Medications:    amLODipine  (NORVASC ) 5 MG tablet, Take 1 tablet (5 mg total) by mouth daily., Disp: 90 tablet, Rfl: 3   apixaban (ELIQUIS) 2.5 MG TABS tablet, Take 1 tablet (2.5 mg total) by mouth 2 (two) times daily., Disp: 180 tablet, Rfl: 1   aspirin  81 MG chewable tablet, Chew 81 mg by mouth daily., Disp: , Rfl:    buPROPion  (WELLBUTRIN  SR) 150 MG 12 hr tablet, Take 1 tablet (150 mg total) by mouth 2 (two) times daily., Disp: 90 tablet, Rfl: 1   nicotine  (NICODERM CQ  - DOSED IN MG/24 HOURS) 21 mg/24hr patch, Place 1 patch (21 mg total) onto the skin daily., Disp: 90 patch, Rfl: 3   nitroGLYCERIN  (NITROSTAT ) 0.4 MG SL tablet, Place 1 tablet (0.4 mg total) under the tongue every 5 (five) minutes as needed for chest pain., Disp: 25 tablet, Rfl: 3   pravastatin  (PRAVACHOL ) 40 MG tablet, Take 1 tablet (40 mg total) by mouth daily., Disp: 90 tablet, Rfl: 1   propranolol  ER (INDERAL  LA) 60 MG 24 hr capsule, Take 1 capsule (60 mg total) by mouth daily. For tremor, Disp: 30 capsule, Rfl: 1       Objective:     BP (!) 150/90 (BP Location: Left Arm, Patient Position: Sitting, Cuff Size: Normal)   Pulse (!) 56   Ht 5' 11 (1.803 m)   Wt 194 lb 9.6 oz (88.3 kg)   SpO2 99%   BMI 27.14 kg/m   Wt Readings  from Last 3 Encounters:  02/23/24 194 lb 9.6 oz (88.3 kg)  02/17/24 194 lb (88 kg)  02/02/24 196 lb 4 oz (89 kg)    Physical Exam  Physical Exam Vitals reviewed.  Constitutional:      Appearance: Normal appearance. Well-developed with normal weight.  Cardiovascular:     Rate and Rhythm: Normal rate and regular rhythm. Normal heart sounds. Normal peripheral pulses Pulmonary:     Normal breath sounds with normal effort Skin:    General: Skin is warm and dry without noticeable rash. Neurological:     General: No focal deficit present.   Psychiatric:        Mood and Affect: Mood, behavior and cognition normal      Assessment & Plan:   Encounter Diagnoses  Name Primary?   Pulmonary nodule, left Yes   Pharyngeal dysphagia    Secondary hypertension    History of blood clots     Orders Placed This Encounter  Procedures   CT SOFT TISSUE NECK W CONTRAST   Ambulatory referral to Cardiothoracic Surgery   Ambulatory referral to Hematology / Oncology     Assessment and Plan    Pulmonary nodule, left lower lobe, suspicious for primary lung cancer 25.4 mm hypermetabolic nodule in left lower lobe on CT and PET, consistent with primary bronchogenic neoplasm. Coordination between cardiothoracic and oncology teams essential for treatment planning. - Refer to cardiothoracic surgery for biopsy evaluation, oncology for cancer management.  Venous thromboembolism risk in the setting of suspected malignancy Suspected lung cancer increases thromboembolism risk, necessitating anticoagulation. - Prescribe apixaban (Eliquis). - Instruct on bleeding risks and signs to monitor.  Dysphagia with evaluation for possible oropharyngeal lesion PET scan shows hypermetabolism in posterior nasopharynx, requires further evaluation. - Order CT imaging of head and neck. - Consider ENT referral for further evaluation if indicated.  Nicotine  dependence, on nicotine  replacement therapy Using nicotine  patches with decreased smoking interest, crucial for surgical preparation. - Continue nicotine  replacement therapy. - Encourage complete smoking cessation.  Benign prostatic hyperplasia Enlarged prostate common for age, monitor symptoms unless he worsens. - Monitor symptoms and manage conservatively.  General Health Maintenance Has not received flu or pneumonia vaccines - Administer flu vaccine. - Administer pneumococcal vaccine.   F/u in 3 weeks

## 2024-02-23 NOTE — Addendum Note (Signed)
 Addended by: BLINDA DOUGLAS on: 02/23/2024 02:33 PM   Modules accepted: Orders

## 2024-02-24 ENCOUNTER — Other Ambulatory Visit: Payer: Self-pay

## 2024-02-24 ENCOUNTER — Telehealth: Payer: Self-pay

## 2024-02-24 ENCOUNTER — Ambulatory Visit: Payer: Self-pay

## 2024-02-24 NOTE — Telephone Encounter (Signed)
 Copied from CRM [id number]. Topic: General - Other >> Feb 23, 2024  4:54 PM Santiya F wrote: Reason for CRM: [facility] radiology is calling in to speak with the nurse regarding findings from a CT scan.

## 2024-02-29 ENCOUNTER — Encounter: Payer: Self-pay | Admitting: Acute Care

## 2024-02-29 ENCOUNTER — Telehealth: Payer: Self-pay | Admitting: Acute Care

## 2024-02-29 ENCOUNTER — Ambulatory Visit (INDEPENDENT_AMBULATORY_CARE_PROVIDER_SITE_OTHER): Admitting: Acute Care

## 2024-02-29 ENCOUNTER — Encounter: Payer: Self-pay | Admitting: Emergency Medicine

## 2024-02-29 VITALS — BP 145/102 | HR 65 | Temp 97.9°F | Ht 71.0 in | Wt 194.8 lb

## 2024-02-29 DIAGNOSIS — F17210 Nicotine dependence, cigarettes, uncomplicated: Secondary | ICD-10-CM | POA: Diagnosis not present

## 2024-02-29 DIAGNOSIS — Z7901 Long term (current) use of anticoagulants: Secondary | ICD-10-CM | POA: Diagnosis not present

## 2024-02-29 DIAGNOSIS — R9389 Abnormal findings on diagnostic imaging of other specified body structures: Secondary | ICD-10-CM

## 2024-02-29 DIAGNOSIS — Z720 Tobacco use: Secondary | ICD-10-CM

## 2024-02-29 DIAGNOSIS — R911 Solitary pulmonary nodule: Secondary | ICD-10-CM | POA: Diagnosis not present

## 2024-02-29 DIAGNOSIS — R942 Abnormal results of pulmonary function studies: Secondary | ICD-10-CM

## 2024-02-29 DIAGNOSIS — Z86718 Personal history of other venous thrombosis and embolism: Secondary | ICD-10-CM

## 2024-02-29 NOTE — H&P (View-Only) (Signed)
 History of Present Illness Jesse [name] is a 68 y.o. male current every day smoker followed through the lung cancer screening program. Here today for nodule consult as follow up to an abnormal lung cancer screening scan 01/2024. He will be followed by Dr. Shelah.   02/29/2024 Discussed the use of AI scribe software for clinical note transcription with the patient, who gave verbal consent to proceed.  History of Present Illness Jesse [name] is a 68 year old male who presents with an abnormal lung cancer screening scan.  An abnormal lung cancer screening scan in September 2025 revealed a 25.4 mm nodule in the left lower lobe of the lung. A subsequent PET scan was performed after the initial abnormal lung cancer screening scan.  He has experienced weight loss from approximately 220 pounds a few years ago to 194 pounds currently, over a period of four to five years. No significant weight loss recently and no hemoptysis.  We have reviewed his PET scan results. They are concerning for malignancy. There is a 25.4 mm left lower lobe pulmonary nodule with an SUV of 32.1. There is also hilar, parahilar and right suprahilar  lymphadenopathy. There is an Intense focus of hypermetabolism identified along the midline mucosa of the posterior nasopharynx. No underlying mass lesion evident by noncontrast CT imaging. Direct visualization recommended to exclude mucosal lesion.  We discussed that the best option moving forward is bronchoscopy with biopsy for definitive tissue diagnosis. [name] I agreement to move forward. We have discussed the risks to include bleeding, infection, puncture of the lung, and adverse reaction to anesthesia.  His past medical history includes a blood clot for which he was hospitalized and treated with blood thinners. He was not on blood thinners after discharge but was recently prescribed Eliquis and aspirin  as a precautionary measure. He has not started taking these medications yet.I  wonder if this was a pre-curser to a potential malignancy.   He has a history of smoking and is currently using a nicotine  patch. He smoked one cigarette since the previous day.I have counseled him to quit.  He experiences dysphagia, with food sometimes getting stuck in his chest, causing pain that resolves with drinking water. He has not had any procedures for this issue before.  No sleep apnea, diabetes, or latex allergy. He mentions being borderline diabetic with a previous reading around six.     Test Results: PET scan 02/21/2024 25.4 mm left lower lobe pulmonary nodule identified on previous lung cancer screening study is intensely hypermetabolic, consistent with primary bronchogenic neoplasm. 2. Subtle low level nodular FDG uptake identified in the hilar regions bilaterally without underlying discrete lymphadenopathy. In retrospect, a tiny 7 mm nodule is identified in the right suprahilar upper lobe nestled in a bronchovascular bundle. This cannot be separated from the adjacent vascularity but does generates some mass-effect on a bronchus immediately posterior to the finding. There is some low level FDG uptake in this region but is considered nonspecific as multiple additional similar tiny foci of low level FDG uptake are identified in the parahilar regions bilaterally. Close attention on follow-up recommended. 3. Intense focus of hypermetabolism identified along the midline mucosa of the posterior nasopharynx. No underlying mass lesion evident by noncontrast CT imaging. Direct visualization recommended to exclude mucosal lesion. 4. No evidence for hypermetabolic metastatic disease in the abdomen or pelvis. 5.  Aortic Atherosclerosis (ICD10-I70.0).  LDCT 02/03/2024 Lungs/Pleura: Centrilobular and paraseptal emphysema. Mild cylindrical bronchiectasis. Left lower lobe nodule measures 25.4 mm (3/217) with  distal mucoid impaction. No additional pulmonary nodules. No pleural fluid.  Airway is unremarkable. 25.4 mm left lower lobe nodule with distal mucoid impaction. Lung-RADS 4B, suspicious. Additional imaging evaluation or consultation with Pulmonology or Thoracic Surgery recommended. These results will be called to the ordering clinician or representative by the Radiologist Assistant, and communication documented in the PACS or [employer]. 2. Mild cylindrical bronchiectasis. 3. Three-vessel coronary artery calcification. 4.  Emphysema (ICD10-J43.9).    Latest Ref Rng & Units 02/17/2024   11:46 AM 09/14/2023    5:54 PM 04/23/2015   10:09 AM  CBC  WBC 3.8 - 10.8 Thousand/uL 5.8  10.1  6.4   Hemoglobin 13.2 - 17.1 g/dL 85.5  84.8  83.9   Hematocrit 38.5 - 50.0 % 43.4  44.4  46.4   Platelets 140 - 400 Thousand/uL 226  355  226        Latest Ref Rng & Units 02/17/2024   11:46 AM 10/27/2023    1:31 PM 09/15/2023    5:57 AM  BMP  Glucose 65 - 99 mg/dL 97  872  895   BUN 7 - 25 mg/dL 9  7  19    Creatinine 0.70 - 1.35 mg/dL 8.85  8.95  8.46   BUN/Creat Ratio 6 - 22 (calc) SEE NOTE:     Sodium 135 - 146 mmol/L 140  140  137   Potassium 3.5 - 5.3 mmol/L 4.2  4.4  4.1   Chloride 98 - 110 mmol/L 107  107  114   CO2 20 - 32 mmol/L 25  25  19    Calcium 8.6 - 10.3 mg/dL 9.2  9.3  8.1     BNP No results found for: BNP  ProBNP No results found for: PROBNP  PFT No results found for: FEV1PRE, FEV1POST, FVCPRE, FVCPOST, TLC, DLCOUNC, PREFEV1FVCRT, PSTFEV1FVCRT  CT SOFT TISSUE NECK W CONTRAST Result Date: 02/23/2024 CLINICAL DATA:  Provided history: Pulmonary nodule, left. Pharyngeal dysphagia. Head/neck cancer, staging. Additional history provided: Positive area on PET scan with history of dysphagia. EXAM: CT NECK WITH CONTRAST TECHNIQUE: Multidetector CT imaging of the neck was performed using the standard protocol following the bolus administration of intravenous contrast. RADIATION DOSE REDUCTION: This exam was performed according to the  departmental dose-optimization program which includes automated exposure control, adjustment of the mA and/or kV according to patient size and/or use of iterative reconstruction technique. CONTRAST:  75mL OMNIPAQUE IOHEXOL 300 MG/ML  SOLN COMPARISON:  Head CT 02/21/2024. FINDINGS: Pharynx and larynx: Nonspecific mucosal enhancement at the midline posterior nasopharynx at site of the hypermetabolic activity described on yesterday's PET-CT (for instance as seen on series 2, images 22-25). No appreciable swelling or mass elsewhere within the oral cavity pharynx or larynx. Small calcific foci within the palatine tonsils bilaterally, which could reflect postinflammatory calcifications and/or tonsilloliths. Salivary glands: Unremarkable. Thyroid: Left thyroid lobe nodules measuring up to 9 mm, not meeting consensus criteria for ultrasound follow-up based on size. No follow-up imaging recommended. Reference: J Am Coll Radiol. 2015 Feb;12(2): 143-50. Lymph nodes: No pathologically enlarged lymph node identified. Vascular: The major vascular structures of the neck are patent. Nonstenotic calcified plaque at the left vertebral artery origin and about the left carotid bifurcation. Atherosclerotic plaque within the intracranial internal carotid arteries. Limited intracranial: No evidence of an acute intracranial abnormality within the field of view. Visualized orbits: No orbital mass or acute orbital finding. Mastoids and visualized paranasal sinuses: Portions of the frontal sinuses are excluded from the field of  view superiorly. No significant paranasal sinus disease or mastoid effusion at the imaged levels. Skeleton: Spondylosis at the cervical and visible thoracic levels. No acute fracture or aggressive osseous lesion. Upper chest: Please refer to the PET CT performed yesterday for description of intrathoracic findings. Impression #1 will be called to the ordering clinician or representative by the Radiologist Assistant, and  communication documented in the PACS or [employer]. IMPRESSION: 1. Nonspecific mucosal enhancement at the midline posterior nasopharynx at site of the hypermetabolic activity described on yesterday's PET-CT. ENT referral and direct visualization are recommended to exclude a mucosal neoplasm. 2. No pathologically enlarged lymph nodes within the neck. 3. Postinflammatory calcifications and/or tonsilloliths within the palatine tonsils. Electronically Signed   By: Rockey Childs D.O.   On: 02/23/2024 16:46   NM PET Image Initial (PI) Skull Base To Thigh Result Date: 02/22/2024 CLINICAL DATA:  Initial treatment strategy for pulmonary nodule. EXAM: NUCLEAR MEDICINE PET SKULL BASE TO THIGH TECHNIQUE: 10.8 mCi F-18 FDG was injected intravenously. Full-ring PET imaging was performed from the skull base to thigh after the radiotracer. CT data was obtained and used for attenuation correction and anatomic localization. Fasting blood glucose: 97 mg/dl COMPARISON:  Chest CT 90/88/7974 FINDINGS: Mediastinal blood pool activity: SUV max 10.8 Liver activity: SUV max 97 NECK: Intense focus of hypermetabolism identified along the midline mucosa of the posterior nasopharynx. No underlying mass lesion evident by noncontrast CT imaging. No hypermetabolic lymphadenopathy in the neck. Incidental CT findings: None. CHEST: Left lower lobe pulmonary nodule measured 25.4 mm on recent lung cancer screening chest CT is intensely hypermetabolic with SUV max = 32.1. Areas of subtle low level nodular FDG uptake identified in the hilar regions bilaterally without underlying discrete hilar lymphadenopathy. In retrospect, there is a tiny 7 mm nodule in the right suprahilar upper lobe nestled in a bronchovascular bundle (see image 95/3 of the previous lung cancer screening exam). This cannot be separated from the adjacent vascularity but does generates some mass-effect on a bronchus immediately posterior to the finding. There is some low level  FDG uptake in this region but is considered nonspecific as multiple additional similar tiny foci of low level FDG uptake are identified in the parahilar regions bilaterally. No intensely hypermetabolic mediastinal or hilar lymph nodes evident. Incidental CT findings: None. ABDOMEN/PELVIS: No abnormal hypermetabolic activity within the liver, pancreas, adrenal glands, or spleen. No hypermetabolic lymph nodes in the abdomen or pelvis. Incidental CT findings: Renal and hepatic cysts evident. Abdominal aorta measures up to 2.9 cm diameter with mild atherosclerotic calcific plaque evident. Prostate gland is enlarged. SKELETON: No focal hypermetabolic activity to suggest skeletal metastasis. Incidental CT findings: None. IMPRESSION: 1. 25.4 mm left lower lobe pulmonary nodule identified on previous lung cancer screening study is intensely hypermetabolic, consistent with primary bronchogenic neoplasm. 2. Subtle low level nodular FDG uptake identified in the hilar regions bilaterally without underlying discrete lymphadenopathy. In retrospect, a tiny 7 mm nodule is identified in the right suprahilar upper lobe nestled in a bronchovascular bundle. This cannot be separated from the adjacent vascularity but does generates some mass-effect on a bronchus immediately posterior to the finding. There is some low level FDG uptake in this region but is considered nonspecific as multiple additional similar tiny foci of low level FDG uptake are identified in the parahilar regions bilaterally. Close attention on follow-up recommended. 3. Intense focus of hypermetabolism identified along the midline mucosa of the posterior nasopharynx. No underlying mass lesion evident by noncontrast CT imaging. Direct visualization  recommended to exclude mucosal lesion. 4. No evidence for hypermetabolic metastatic disease in the abdomen or pelvis. 5.  Aortic Atherosclerosis (ICD10-I70.0). Electronically Signed   By: Camellia Candle M.D.   On: 02/22/2024  05:54   CT CHEST LUNG CA SCREEN LOW DOSE W/O CM Result Date: 02/13/2024 CLINICAL DATA:  Current 23 pack-year smoker. EXAM: CT CHEST WITHOUT CONTRAST LOW-DOSE FOR LUNG CANCER SCREENING TECHNIQUE: Multidetector CT imaging of the chest was performed following the standard protocol without IV contrast. RADIATION DOSE REDUCTION: This exam was performed according to the departmental dose-optimization program which includes automated exposure control, adjustment of the mA and/or kV according to patient size and/or use of iterative reconstruction technique. COMPARISON:  None Available. FINDINGS: Cardiovascular: Three-vessel coronary artery calcification. Heart size normal. No pericardial effusion. Mediastinum/Nodes: No pathologically enlarged mediastinal or axillary lymph nodes. Hilar regions are difficult to definitively evaluate without IV contrast. Esophagus is grossly unremarkable. Lungs/Pleura: Centrilobular and paraseptal emphysema. Mild cylindrical bronchiectasis. Left lower lobe nodule measures 25.4 mm (3/217) with distal mucoid impaction. No additional pulmonary nodules. No pleural fluid. Airway is unremarkable. Upper Abdomen: Low-attenuation lesions in the liver and kidneys. No specific follow-up necessary. Visualized portions of the liver, gallbladder, adrenal glands, kidneys, spleen, pancreas, stomach and bowel are otherwise grossly unremarkable. No upper abdominal adenopathy. Musculoskeletal: Degenerative changes in the spine. IMPRESSION: 1. 25.4 mm left lower lobe nodule with distal mucoid impaction. Lung-RADS 4B, suspicious. Additional imaging evaluation or consultation with Pulmonology or Thoracic Surgery recommended. These results will be called to the ordering clinician or representative by the Radiologist Assistant, and communication documented in the PACS or [employer]. 2. Mild cylindrical bronchiectasis. 3. Three-vessel coronary artery calcification. 4.  Emphysema (ICD10-J43.9). Electronically  Signed   By: Newell Eke M.D.   On: 02/13/2024 16:01   MM 3D DIAGNOSTIC MAMMOGRAM BILATERAL BREAST Result Date: 02/01/2024 CLINICAL DATA:  RIGHT breast pain LATERAL to the nipple times 18 months. Has known history of gynecomastia. EXAM: DIGITAL DIAGNOSTIC BILATERAL MAMMOGRAM WITH TOMOSYNTHESIS AND CAD; ULTRASOUND RIGHT BREAST LIMITED TECHNIQUE: Bilateral digital diagnostic mammography and breast tomosynthesis was performed. The images were evaluated with computer-aided detection. ; Targeted ultrasound examination of the right breast was performed COMPARISON:  Previous exam(s). ACR Breast Density Category b: There are scattered areas of fibroglandular density. FINDINGS: BILATERAL RETROAREOLAR flame shaped densities, compatible with benign gynecomastia. Spot compression views of the RIGHT breast in the area of LATERAL breast pain were performed, without underlying mammographic abnormality aside from the above described gynecomastia. Additional RETROAREOLAR spot views in the CC view were performed bilaterally to achieve a nipple in profile, not due to additional symptoms. Given that the pain is slightly LATERAL to the nipple, targeted ultrasound of the RIGHT breast centered at the 10 o'clock position 3 cm from the nipple in the area of patient directed pain was performed. Gynecomastia without suspicious sonographic abnormality is noted in this region. No concerning abnormalities on my physical examination of the area. IMPRESSION: Benign, RIGHT greater than LEFT gynecomastia. The area of chronic pain in the RIGHT breast correlates with gynecomastia extending into the upper-outer quadrant. There is no mammographic or sonographic evidence of malignancy. RECOMMENDATION: Clinical follow-up to evaluate for potentially reversible causes of gynecomastia. Patient also states desire for surgical consultation for consideration of excision given the chronic time course of his breast pain. I have discussed the findings and  recommendations with the patient. If applicable, a reminder letter will be sent to the patient regarding the next appointment. BI-RADS CATEGORY  2: Benign.  Electronically Signed   By: Norleen Croak M.D.   On: 02/01/2024 09:37   US  LIMITED ULTRASOUND INCLUDING AXILLA RIGHT BREAST Result Date: 02/01/2024 CLINICAL DATA:  RIGHT breast pain LATERAL to the nipple times 18 months. Has known history of gynecomastia. EXAM: DIGITAL DIAGNOSTIC BILATERAL MAMMOGRAM WITH TOMOSYNTHESIS AND CAD; ULTRASOUND RIGHT BREAST LIMITED TECHNIQUE: Bilateral digital diagnostic mammography and breast tomosynthesis was performed. The images were evaluated with computer-aided detection. ; Targeted ultrasound examination of the right breast was performed COMPARISON:  Previous exam(s). ACR Breast Density Category b: There are scattered areas of fibroglandular density. FINDINGS: BILATERAL RETROAREOLAR flame shaped densities, compatible with benign gynecomastia. Spot compression views of the RIGHT breast in the area of LATERAL breast pain were performed, without underlying mammographic abnormality aside from the above described gynecomastia. Additional RETROAREOLAR spot views in the CC view were performed bilaterally to achieve a nipple in profile, not due to additional symptoms. Given that the pain is slightly LATERAL to the nipple, targeted ultrasound of the RIGHT breast centered at the 10 o'clock position 3 cm from the nipple in the area of patient directed pain was performed. Gynecomastia without suspicious sonographic abnormality is noted in this region. No concerning abnormalities on my physical examination of the area. IMPRESSION: Benign, RIGHT greater than LEFT gynecomastia. The area of chronic pain in the RIGHT breast correlates with gynecomastia extending into the upper-outer quadrant. There is no mammographic or sonographic evidence of malignancy. RECOMMENDATION: Clinical follow-up to evaluate for potentially reversible causes of  gynecomastia. Patient also states desire for surgical consultation for consideration of excision given the chronic time course of his breast pain. I have discussed the findings and recommendations with the patient. If applicable, a reminder letter will be sent to the patient regarding the next appointment. BI-RADS CATEGORY  2: Benign. Electronically Signed   By: Norleen Croak M.D.   On: 02/01/2024 09:37     Past medical hx Past Medical History:  Diagnosis Date   Depression 02/15/2014   Hypertension    Pulmonary embolism (HCC)    Sleep disturbance 02/15/2014   Tremors of nervous system    Trochanteric bursitis of left hip 08/30/2018     Social History   Tobacco Use   Smoking status: Every Day    Current packs/day: 0.65    Average packs/day: 0.6 packs/day for 36.8 years (23.9 ttl pk-yrs)    Types: Cigarettes    Start date: 1989   Smokeless tobacco: Never   Tobacco comments:    trying to quit smoking,1 cigarettes a day KRD 02/29/2024  Vaping Use   Vaping status: Never Used  Substance Use Topics   Alcohol use: No   Drug use: No    Mr.Grillo reports that he has been smoking cigarettes. He started smoking about 36 years ago. He has a 23.9 pack-year smoking history. He has never used smokeless tobacco. He reports that he does not drink alcohol and does not use drugs.  Tobacco Cessation: Ready to quit: Not Answered Counseling given: Not Answered Tobacco comments: trying to quit smoking,1 cigarettes a day KRD 02/29/2024 Current every day smoker, counseled to quit  Past surgical hx, Family hx, Social hx all reviewed.  Current Outpatient Medications on File Prior to Visit  Medication Sig   amLODipine  (NORVASC ) 5 MG tablet Take 1 tablet (5 mg total) by mouth daily.   apixaban (ELIQUIS) 2.5 MG TABS tablet Take 1 tablet (2.5 mg total) by mouth 2 (two) times daily.   aspirin  81 MG chewable tablet Chew  81 mg by mouth daily.   buPROPion  (WELLBUTRIN  SR) 150 MG 12 hr tablet Take 1 tablet  (150 mg total) by mouth 2 (two) times daily.   nicotine  (NICODERM CQ  - DOSED IN MG/24 HOURS) 21 mg/24hr patch Place 1 patch (21 mg total) onto the skin daily.   nitroGLYCERIN  (NITROSTAT ) 0.4 MG SL tablet Place 1 tablet (0.4 mg total) under the tongue every 5 (five) minutes as needed for chest pain.   pravastatin  (PRAVACHOL ) 40 MG tablet Take 1 tablet (40 mg total) by mouth daily.   propranolol  ER (INDERAL  LA) 60 MG 24 hr capsule Take 1 capsule (60 mg total) by mouth daily. For tremor   No current facility-administered medications on file prior to visit.     Allergies  Allergen Reactions   Bactrim [Sulfamethoxazole-Trimethoprim] Other (See Comments)    Acute kidney injury   Penicillins     Review Of Systems:  Constitutional:   No  weight loss, night sweats,  Fevers, chills, fatigue, or  lassitude.  HEENT:   No headaches,  Difficulty swallowing,  Tooth/dental problems, or  Sore throat,                No sneezing, itching, ear ache, nasal congestion, post nasal drip,   CV:  No chest pain,  Orthopnea, PND, swelling in lower extremities, anasarca, dizziness, palpitations, syncope.   GI  No heartburn, indigestion, abdominal pain, nausea, vomiting, diarrhea, change in bowel habits, loss of appetite, bloody stools.   Resp: No shortness of breath with exertion or at rest.  No excess mucus, no productive cough,  No non-productive cough,  No coughing up of blood.  No change in color of mucus.  No wheezing.  No chest wall deformity  Skin: no rash or lesions.  GU: no dysuria, change in color of urine, no urgency or frequency.  No flank pain, no hematuria   MS:  No joint pain or swelling.  No decreased range of motion.  No back pain.  Psych:  No change in mood or affect. No depression or anxiety.  No memory loss.   Vital Signs BP (!) 145/102   Pulse 65   Temp 97.9 F (36.6 C) (Oral)   Ht 5' 11 (1.803 m)   Wt 194 lb 12.8 oz (88.4 kg)   SpO2 98%   BMI 27.17 kg/m    Physical  Exam:  General- No distress,  A&Ox3, pleasant ENT: No sinus tenderness, TM clear, pale nasal mucosa, no oral exudate,no post nasal drip, no LAN Cardiac: S1, S2, regular rate and rhythm, no murmur Chest: No wheeze/ rales/ dullness; no accessory muscle use, no nasal flaring, no sternal retractions Abd.: Soft Non-tender, ND, BS +, Body mass index is 27.17 kg/m.  Ext: No clubbing cyanosis, edema, no obvious deformities Neuro:  normal strength, MAE x 4, A&O x 3 Skin: No rashes, warm and dry, no obvious skin lesions  Psych: normal mood and behavior   Assessment/Plan  Assessment and Plan Assessment & Plan Suspicious left lower lobe lung nodule with concern for malignancy 25.4 mm hypermetabolic nodule in left lower lobe on PET scan, suspicious for lung cancer. Lymph node uptake noted, biopsy required. - Schedule navigational bronchoscopy with biopsy for October 13th at [facility]. - Instruct to hold Eliquis and aspirin  on October 11th and 12th. - Provide pre-procedure instructions and hospital information.  Current tobacco use Current smoker using nicotine  patch for cessation.  History of blood clots , but not taking Eliquis as Prescribed  Plan Start Eliquis and take as prescribed. Hold dose 10/11 and 10/12  prior to procedure to minimize risk of bleeding    I spent 30 minutes dedicated to the care of this patient on the date of this encounter to include pre-visit review of records, face-to-face time with the patient discussing conditions above, post visit ordering of testing, clinical documentation with the electronic health record, making appropriate referrals as documented, and communicating necessary information to the patient's healthcare team.   Lauraine JULIANNA Lites, NP 02/29/2024  5:33 PM

## 2024-02-29 NOTE — Patient Instructions (Addendum)
 It is good to see you today. We have reviewed the results of your LDCT and your PET scan. The PET scan is concerning for malignancy. Plan is for a bronchoscopy with biopsy for tissue diagnosis. Hold Eliquis and Aspirin  on 10/11, and 10/12. I have placed an order for a bronchoscopy with biopsies.  We have discussed the procedure in detail.  We have reviewed the risks and benefits of the procedure. These include bleeding, infection, puncture of the lung, and adverse reaction to anesthesia. You have agreed to proceed with biopsy to evaluate the left lower  lobe nodule. Your procedure will be done by Dr. Lamar Chris. You will receive a letter today with date time and information pertaining to the procedure. You will need someone to drive you to the procedure, stay with you during the procedure, and stay with you after the procedure. You will also need someone to stay with you for 24 hours after anesthesia to ensure you have cleared and are doing well. You will follow-up with me 1 week after the procedure to review the results and to ensure you are doing well. Call if you need us  prior to the procedure or if you have any questions at all. Please contact office for sooner follow up if symptoms do not improve or worsen or seek emergency care     Please work on quitting smoking. You can receive free nicotine  replacement therapy (patches, gum, or mints) by calling 1-800-QUIT NOW. Please call so we can get you on the path to becoming a non-smoker. I know it is hard, but you can do this!  Hypnosis for smoking cessation  [employer] [phone]  Acupuncture for smoking cessation  [employer] [phone]

## 2024-02-29 NOTE — Telephone Encounter (Signed)
 Please schedule the following:  Provider performing procedure:[facility] Diagnosis: Lung nodule Which side if for nodule / mass?  Left Procedure:  Navigational bronchoscopy with biopsies   Has patient been spoken to by Provider and given informed consent?  Chaney Domino NP on 02/29/2024 Anesthesia:  General Do you need Fluro?  Yes Duration of procedure:  1.5 hours Date:  03/06/2024 , 7:30 am case Alternate Date: 10/14  Time: AM Location:  [facility] [facility] Does patient have OSA?  No DM?  No Or Latex allergy? No Medication Restriction/ Anticoagulate/Antiplatelet:  Supposed to be taking Eliquis and ASA but not taking Pre-op Labs Ordered:determined by Anesthesia Imaging request:  Recent LDCT  (If, SuperDimension CT Chest, please have STAT courier sent to ENDO)

## 2024-02-29 NOTE — Progress Notes (Signed)
 History of Present Illness Jesse [name] is a 68 y.o. male current every day smoker followed through the lung cancer screening program. Here today for nodule consult as follow up to an abnormal lung cancer screening scan 01/2024. He will be followed by Dr. Shelah.   02/29/2024 Discussed the use of AI scribe software for clinical note transcription with the patient, who gave verbal consent to proceed.  History of Present Illness Jesse [name] is a 68 year old male who presents with an abnormal lung cancer screening scan.  An abnormal lung cancer screening scan in September 2025 revealed a 25.4 mm nodule in the left lower lobe of the lung. A subsequent PET scan was performed after the initial abnormal lung cancer screening scan.  He has experienced weight loss from approximately 220 pounds a few years ago to 194 pounds currently, over a period of four to five years. No significant weight loss recently and no hemoptysis.  We have reviewed his PET scan results. They are concerning for malignancy. There is a 25.4 mm left lower lobe pulmonary nodule with an SUV of 32.1. There is also hilar, parahilar and right suprahilar  lymphadenopathy. There is an Intense focus of hypermetabolism identified along the midline mucosa of the posterior nasopharynx. No underlying mass lesion evident by noncontrast CT imaging. Direct visualization recommended to exclude mucosal lesion.  We discussed that the best option moving forward is bronchoscopy with biopsy for definitive tissue diagnosis. [name] I agreement to move forward. We have discussed the risks to include bleeding, infection, puncture of the lung, and adverse reaction to anesthesia.  His past medical history includes a blood clot for which he was hospitalized and treated with blood thinners. He was not on blood thinners after discharge but was recently prescribed Eliquis and aspirin  as a precautionary measure. He has not started taking these medications yet.I  wonder if this was a pre-curser to a potential malignancy.   He has a history of smoking and is currently using a nicotine  patch. He smoked one cigarette since the previous day.I have counseled him to quit.  He experiences dysphagia, with food sometimes getting stuck in his chest, causing pain that resolves with drinking water. He has not had any procedures for this issue before.  No sleep apnea, diabetes, or latex allergy. He mentions being borderline diabetic with a previous reading around six.     Test Results: PET scan 02/21/2024 25.4 mm left lower lobe pulmonary nodule identified on previous lung cancer screening study is intensely hypermetabolic, consistent with primary bronchogenic neoplasm. 2. Subtle low level nodular FDG uptake identified in the hilar regions bilaterally without underlying discrete lymphadenopathy. In retrospect, a tiny 7 mm nodule is identified in the right suprahilar upper lobe nestled in a bronchovascular bundle. This cannot be separated from the adjacent vascularity but does generates some mass-effect on a bronchus immediately posterior to the finding. There is some low level FDG uptake in this region but is considered nonspecific as multiple additional similar tiny foci of low level FDG uptake are identified in the parahilar regions bilaterally. Close attention on follow-up recommended. 3. Intense focus of hypermetabolism identified along the midline mucosa of the posterior nasopharynx. No underlying mass lesion evident by noncontrast CT imaging. Direct visualization recommended to exclude mucosal lesion. 4. No evidence for hypermetabolic metastatic disease in the abdomen or pelvis. 5.  Aortic Atherosclerosis (ICD10-I70.0).  LDCT 02/03/2024 Lungs/Pleura: Centrilobular and paraseptal emphysema. Mild cylindrical bronchiectasis. Left lower lobe nodule measures 25.4 mm (3/217) with  distal mucoid impaction. No additional pulmonary nodules. No pleural fluid.  Airway is unremarkable. 25.4 mm left lower lobe nodule with distal mucoid impaction. Lung-RADS 4B, suspicious. Additional imaging evaluation or consultation with Pulmonology or Thoracic Surgery recommended. These results will be called to the ordering clinician or representative by the Radiologist Assistant, and communication documented in the PACS or [employer]. 2. Mild cylindrical bronchiectasis. 3. Three-vessel coronary artery calcification. 4.  Emphysema (ICD10-J43.9).    Latest Ref Rng & Units 02/17/2024   11:46 AM 09/14/2023    5:54 PM 04/23/2015   10:09 AM  CBC  WBC 3.8 - 10.8 Thousand/uL 5.8  10.1  6.4   Hemoglobin 13.2 - 17.1 g/dL 85.5  84.8  83.9   Hematocrit 38.5 - 50.0 % 43.4  44.4  46.4   Platelets 140 - 400 Thousand/uL 226  355  226        Latest Ref Rng & Units 02/17/2024   11:46 AM 10/27/2023    1:31 PM 09/15/2023    5:57 AM  BMP  Glucose 65 - 99 mg/dL 97  872  895   BUN 7 - 25 mg/dL 9  7  19    Creatinine 0.70 - 1.35 mg/dL 8.85  8.95  8.46   BUN/Creat Ratio 6 - 22 (calc) SEE NOTE:     Sodium 135 - 146 mmol/L 140  140  137   Potassium 3.5 - 5.3 mmol/L 4.2  4.4  4.1   Chloride 98 - 110 mmol/L 107  107  114   CO2 20 - 32 mmol/L 25  25  19    Calcium 8.6 - 10.3 mg/dL 9.2  9.3  8.1     BNP No results found for: BNP  ProBNP No results found for: PROBNP  PFT No results found for: FEV1PRE, FEV1POST, FVCPRE, FVCPOST, TLC, DLCOUNC, PREFEV1FVCRT, PSTFEV1FVCRT  CT SOFT TISSUE NECK W CONTRAST Result Date: 02/23/2024 CLINICAL DATA:  Provided history: Pulmonary nodule, left. Pharyngeal dysphagia. Head/neck cancer, staging. Additional history provided: Positive area on PET scan with history of dysphagia. EXAM: CT NECK WITH CONTRAST TECHNIQUE: Multidetector CT imaging of the neck was performed using the standard protocol following the bolus administration of intravenous contrast. RADIATION DOSE REDUCTION: This exam was performed according to the  departmental dose-optimization program which includes automated exposure control, adjustment of the mA and/or kV according to patient size and/or use of iterative reconstruction technique. CONTRAST:  75mL OMNIPAQUE IOHEXOL 300 MG/ML  SOLN COMPARISON:  Head CT 02/21/2024. FINDINGS: Pharynx and larynx: Nonspecific mucosal enhancement at the midline posterior nasopharynx at site of the hypermetabolic activity described on yesterday's PET-CT (for instance as seen on series 2, images 22-25). No appreciable swelling or mass elsewhere within the oral cavity pharynx or larynx. Small calcific foci within the palatine tonsils bilaterally, which could reflect postinflammatory calcifications and/or tonsilloliths. Salivary glands: Unremarkable. Thyroid: Left thyroid lobe nodules measuring up to 9 mm, not meeting consensus criteria for ultrasound follow-up based on size. No follow-up imaging recommended. Reference: J Am Coll Radiol. 2015 Feb;12(2): 143-50. Lymph nodes: No pathologically enlarged lymph node identified. Vascular: The major vascular structures of the neck are patent. Nonstenotic calcified plaque at the left vertebral artery origin and about the left carotid bifurcation. Atherosclerotic plaque within the intracranial internal carotid arteries. Limited intracranial: No evidence of an acute intracranial abnormality within the field of view. Visualized orbits: No orbital mass or acute orbital finding. Mastoids and visualized paranasal sinuses: Portions of the frontal sinuses are excluded from the field of  view superiorly. No significant paranasal sinus disease or mastoid effusion at the imaged levels. Skeleton: Spondylosis at the cervical and visible thoracic levels. No acute fracture or aggressive osseous lesion. Upper chest: Please refer to the PET CT performed yesterday for description of intrathoracic findings. Impression #1 will be called to the ordering clinician or representative by the Radiologist Assistant, and  communication documented in the PACS or [employer]. IMPRESSION: 1. Nonspecific mucosal enhancement at the midline posterior nasopharynx at site of the hypermetabolic activity described on yesterday's PET-CT. ENT referral and direct visualization are recommended to exclude a mucosal neoplasm. 2. No pathologically enlarged lymph nodes within the neck. 3. Postinflammatory calcifications and/or tonsilloliths within the palatine tonsils. Electronically Signed   By: Rockey Childs D.O.   On: 02/23/2024 16:46   NM PET Image Initial (PI) Skull Base To Thigh Result Date: 02/22/2024 CLINICAL DATA:  Initial treatment strategy for pulmonary nodule. EXAM: NUCLEAR MEDICINE PET SKULL BASE TO THIGH TECHNIQUE: 10.8 mCi F-18 FDG was injected intravenously. Full-ring PET imaging was performed from the skull base to thigh after the radiotracer. CT data was obtained and used for attenuation correction and anatomic localization. Fasting blood glucose: 97 mg/dl COMPARISON:  Chest CT 90/88/7974 FINDINGS: Mediastinal blood pool activity: SUV max 10.8 Liver activity: SUV max 97 NECK: Intense focus of hypermetabolism identified along the midline mucosa of the posterior nasopharynx. No underlying mass lesion evident by noncontrast CT imaging. No hypermetabolic lymphadenopathy in the neck. Incidental CT findings: None. CHEST: Left lower lobe pulmonary nodule measured 25.4 mm on recent lung cancer screening chest CT is intensely hypermetabolic with SUV max = 32.1. Areas of subtle low level nodular FDG uptake identified in the hilar regions bilaterally without underlying discrete hilar lymphadenopathy. In retrospect, there is a tiny 7 mm nodule in the right suprahilar upper lobe nestled in a bronchovascular bundle (see image 95/3 of the previous lung cancer screening exam). This cannot be separated from the adjacent vascularity but does generates some mass-effect on a bronchus immediately posterior to the finding. There is some low level  FDG uptake in this region but is considered nonspecific as multiple additional similar tiny foci of low level FDG uptake are identified in the parahilar regions bilaterally. No intensely hypermetabolic mediastinal or hilar lymph nodes evident. Incidental CT findings: None. ABDOMEN/PELVIS: No abnormal hypermetabolic activity within the liver, pancreas, adrenal glands, or spleen. No hypermetabolic lymph nodes in the abdomen or pelvis. Incidental CT findings: Renal and hepatic cysts evident. Abdominal aorta measures up to 2.9 cm diameter with mild atherosclerotic calcific plaque evident. Prostate gland is enlarged. SKELETON: No focal hypermetabolic activity to suggest skeletal metastasis. Incidental CT findings: None. IMPRESSION: 1. 25.4 mm left lower lobe pulmonary nodule identified on previous lung cancer screening study is intensely hypermetabolic, consistent with primary bronchogenic neoplasm. 2. Subtle low level nodular FDG uptake identified in the hilar regions bilaterally without underlying discrete lymphadenopathy. In retrospect, a tiny 7 mm nodule is identified in the right suprahilar upper lobe nestled in a bronchovascular bundle. This cannot be separated from the adjacent vascularity but does generates some mass-effect on a bronchus immediately posterior to the finding. There is some low level FDG uptake in this region but is considered nonspecific as multiple additional similar tiny foci of low level FDG uptake are identified in the parahilar regions bilaterally. Close attention on follow-up recommended. 3. Intense focus of hypermetabolism identified along the midline mucosa of the posterior nasopharynx. No underlying mass lesion evident by noncontrast CT imaging. Direct visualization  recommended to exclude mucosal lesion. 4. No evidence for hypermetabolic metastatic disease in the abdomen or pelvis. 5.  Aortic Atherosclerosis (ICD10-I70.0). Electronically Signed   By: Camellia Candle M.D.   On: 02/22/2024  05:54   CT CHEST LUNG CA SCREEN LOW DOSE W/O CM Result Date: 02/13/2024 CLINICAL DATA:  Current 23 pack-year smoker. EXAM: CT CHEST WITHOUT CONTRAST LOW-DOSE FOR LUNG CANCER SCREENING TECHNIQUE: Multidetector CT imaging of the chest was performed following the standard protocol without IV contrast. RADIATION DOSE REDUCTION: This exam was performed according to the departmental dose-optimization program which includes automated exposure control, adjustment of the mA and/or kV according to patient size and/or use of iterative reconstruction technique. COMPARISON:  None Available. FINDINGS: Cardiovascular: Three-vessel coronary artery calcification. Heart size normal. No pericardial effusion. Mediastinum/Nodes: No pathologically enlarged mediastinal or axillary lymph nodes. Hilar regions are difficult to definitively evaluate without IV contrast. Esophagus is grossly unremarkable. Lungs/Pleura: Centrilobular and paraseptal emphysema. Mild cylindrical bronchiectasis. Left lower lobe nodule measures 25.4 mm (3/217) with distal mucoid impaction. No additional pulmonary nodules. No pleural fluid. Airway is unremarkable. Upper Abdomen: Low-attenuation lesions in the liver and kidneys. No specific follow-up necessary. Visualized portions of the liver, gallbladder, adrenal glands, kidneys, spleen, pancreas, stomach and bowel are otherwise grossly unremarkable. No upper abdominal adenopathy. Musculoskeletal: Degenerative changes in the spine. IMPRESSION: 1. 25.4 mm left lower lobe nodule with distal mucoid impaction. Lung-RADS 4B, suspicious. Additional imaging evaluation or consultation with Pulmonology or Thoracic Surgery recommended. These results will be called to the ordering clinician or representative by the Radiologist Assistant, and communication documented in the PACS or [employer]. 2. Mild cylindrical bronchiectasis. 3. Three-vessel coronary artery calcification. 4.  Emphysema (ICD10-J43.9). Electronically  Signed   By: Newell Eke M.D.   On: 02/13/2024 16:01   MM 3D DIAGNOSTIC MAMMOGRAM BILATERAL BREAST Result Date: 02/01/2024 CLINICAL DATA:  RIGHT breast pain LATERAL to the nipple times 18 months. Has known history of gynecomastia. EXAM: DIGITAL DIAGNOSTIC BILATERAL MAMMOGRAM WITH TOMOSYNTHESIS AND CAD; ULTRASOUND RIGHT BREAST LIMITED TECHNIQUE: Bilateral digital diagnostic mammography and breast tomosynthesis was performed. The images were evaluated with computer-aided detection. ; Targeted ultrasound examination of the right breast was performed COMPARISON:  Previous exam(s). ACR Breast Density Category b: There are scattered areas of fibroglandular density. FINDINGS: BILATERAL RETROAREOLAR flame shaped densities, compatible with benign gynecomastia. Spot compression views of the RIGHT breast in the area of LATERAL breast pain were performed, without underlying mammographic abnormality aside from the above described gynecomastia. Additional RETROAREOLAR spot views in the CC view were performed bilaterally to achieve a nipple in profile, not due to additional symptoms. Given that the pain is slightly LATERAL to the nipple, targeted ultrasound of the RIGHT breast centered at the 10 o'clock position 3 cm from the nipple in the area of patient directed pain was performed. Gynecomastia without suspicious sonographic abnormality is noted in this region. No concerning abnormalities on my physical examination of the area. IMPRESSION: Benign, RIGHT greater than LEFT gynecomastia. The area of chronic pain in the RIGHT breast correlates with gynecomastia extending into the upper-outer quadrant. There is no mammographic or sonographic evidence of malignancy. RECOMMENDATION: Clinical follow-up to evaluate for potentially reversible causes of gynecomastia. Patient also states desire for surgical consultation for consideration of excision given the chronic time course of his breast pain. I have discussed the findings and  recommendations with the patient. If applicable, a reminder letter will be sent to the patient regarding the next appointment. BI-RADS CATEGORY  2: Benign.  Electronically Signed   By: Norleen Croak M.D.   On: 02/01/2024 09:37   US  LIMITED ULTRASOUND INCLUDING AXILLA RIGHT BREAST Result Date: 02/01/2024 CLINICAL DATA:  RIGHT breast pain LATERAL to the nipple times 18 months. Has known history of gynecomastia. EXAM: DIGITAL DIAGNOSTIC BILATERAL MAMMOGRAM WITH TOMOSYNTHESIS AND CAD; ULTRASOUND RIGHT BREAST LIMITED TECHNIQUE: Bilateral digital diagnostic mammography and breast tomosynthesis was performed. The images were evaluated with computer-aided detection. ; Targeted ultrasound examination of the right breast was performed COMPARISON:  Previous exam(s). ACR Breast Density Category b: There are scattered areas of fibroglandular density. FINDINGS: BILATERAL RETROAREOLAR flame shaped densities, compatible with benign gynecomastia. Spot compression views of the RIGHT breast in the area of LATERAL breast pain were performed, without underlying mammographic abnormality aside from the above described gynecomastia. Additional RETROAREOLAR spot views in the CC view were performed bilaterally to achieve a nipple in profile, not due to additional symptoms. Given that the pain is slightly LATERAL to the nipple, targeted ultrasound of the RIGHT breast centered at the 10 o'clock position 3 cm from the nipple in the area of patient directed pain was performed. Gynecomastia without suspicious sonographic abnormality is noted in this region. No concerning abnormalities on my physical examination of the area. IMPRESSION: Benign, RIGHT greater than LEFT gynecomastia. The area of chronic pain in the RIGHT breast correlates with gynecomastia extending into the upper-outer quadrant. There is no mammographic or sonographic evidence of malignancy. RECOMMENDATION: Clinical follow-up to evaluate for potentially reversible causes of  gynecomastia. Patient also states desire for surgical consultation for consideration of excision given the chronic time course of his breast pain. I have discussed the findings and recommendations with the patient. If applicable, a reminder letter will be sent to the patient regarding the next appointment. BI-RADS CATEGORY  2: Benign. Electronically Signed   By: Norleen Croak M.D.   On: 02/01/2024 09:37     Past medical hx Past Medical History:  Diagnosis Date   Depression 02/15/2014   Hypertension    Pulmonary embolism (HCC)    Sleep disturbance 02/15/2014   Tremors of nervous system    Trochanteric bursitis of left hip 08/30/2018     Social History   Tobacco Use   Smoking status: Every Day    Current packs/day: 0.65    Average packs/day: 0.6 packs/day for 36.8 years (23.9 ttl pk-yrs)    Types: Cigarettes    Start date: 1989   Smokeless tobacco: Never   Tobacco comments:    trying to quit smoking,1 cigarettes a day KRD 02/29/2024  Vaping Use   Vaping status: Never Used  Substance Use Topics   Alcohol use: No   Drug use: No    Mr.Grillo reports that he has been smoking cigarettes. He started smoking about 36 years ago. He has a 23.9 pack-year smoking history. He has never used smokeless tobacco. He reports that he does not drink alcohol and does not use drugs.  Tobacco Cessation: Ready to quit: Not Answered Counseling given: Not Answered Tobacco comments: trying to quit smoking,1 cigarettes a day KRD 02/29/2024 Current every day smoker, counseled to quit  Past surgical hx, Family hx, Social hx all reviewed.  Current Outpatient Medications on File Prior to Visit  Medication Sig   amLODipine  (NORVASC ) 5 MG tablet Take 1 tablet (5 mg total) by mouth daily.   apixaban (ELIQUIS) 2.5 MG TABS tablet Take 1 tablet (2.5 mg total) by mouth 2 (two) times daily.   aspirin  81 MG chewable tablet Chew  81 mg by mouth daily.   buPROPion  (WELLBUTRIN  SR) 150 MG 12 hr tablet Take 1 tablet  (150 mg total) by mouth 2 (two) times daily.   nicotine  (NICODERM CQ  - DOSED IN MG/24 HOURS) 21 mg/24hr patch Place 1 patch (21 mg total) onto the skin daily.   nitroGLYCERIN  (NITROSTAT ) 0.4 MG SL tablet Place 1 tablet (0.4 mg total) under the tongue every 5 (five) minutes as needed for chest pain.   pravastatin  (PRAVACHOL ) 40 MG tablet Take 1 tablet (40 mg total) by mouth daily.   propranolol  ER (INDERAL  LA) 60 MG 24 hr capsule Take 1 capsule (60 mg total) by mouth daily. For tremor   No current facility-administered medications on file prior to visit.     Allergies  Allergen Reactions   Bactrim [Sulfamethoxazole-Trimethoprim] Other (See Comments)    Acute kidney injury   Penicillins     Review Of Systems:  Constitutional:   No  weight loss, night sweats,  Fevers, chills, fatigue, or  lassitude.  HEENT:   No headaches,  Difficulty swallowing,  Tooth/dental problems, or  Sore throat,                No sneezing, itching, ear ache, nasal congestion, post nasal drip,   CV:  No chest pain,  Orthopnea, PND, swelling in lower extremities, anasarca, dizziness, palpitations, syncope.   GI  No heartburn, indigestion, abdominal pain, nausea, vomiting, diarrhea, change in bowel habits, loss of appetite, bloody stools.   Resp: No shortness of breath with exertion or at rest.  No excess mucus, no productive cough,  No non-productive cough,  No coughing up of blood.  No change in color of mucus.  No wheezing.  No chest wall deformity  Skin: no rash or lesions.  GU: no dysuria, change in color of urine, no urgency or frequency.  No flank pain, no hematuria   MS:  No joint pain or swelling.  No decreased range of motion.  No back pain.  Psych:  No change in mood or affect. No depression or anxiety.  No memory loss.   Vital Signs BP (!) 145/102   Pulse 65   Temp 97.9 F (36.6 C) (Oral)   Ht 5' 11 (1.803 m)   Wt 194 lb 12.8 oz (88.4 kg)   SpO2 98%   BMI 27.17 kg/m    Physical  Exam:  General- No distress,  A&Ox3, pleasant ENT: No sinus tenderness, TM clear, pale nasal mucosa, no oral exudate,no post nasal drip, no LAN Cardiac: S1, S2, regular rate and rhythm, no murmur Chest: No wheeze/ rales/ dullness; no accessory muscle use, no nasal flaring, no sternal retractions Abd.: Soft Non-tender, ND, BS +, Body mass index is 27.17 kg/m.  Ext: No clubbing cyanosis, edema, no obvious deformities Neuro:  normal strength, MAE x 4, A&O x 3 Skin: No rashes, warm and dry, no obvious skin lesions  Psych: normal mood and behavior   Assessment/Plan  Assessment and Plan Assessment & Plan Suspicious left lower lobe lung nodule with concern for malignancy 25.4 mm hypermetabolic nodule in left lower lobe on PET scan, suspicious for lung cancer. Lymph node uptake noted, biopsy required. - Schedule navigational bronchoscopy with biopsy for October 13th at [facility]. - Instruct to hold Eliquis and aspirin  on October 11th and 12th. - Provide pre-procedure instructions and hospital information.  Current tobacco use Current smoker using nicotine  patch for cessation.  History of blood clots , but not taking Eliquis as Prescribed  Plan Start Eliquis and take as prescribed. Hold dose 10/11 and 10/12  prior to procedure to minimize risk of bleeding    I spent 30 minutes dedicated to the care of this patient on the date of this encounter to include pre-visit review of records, face-to-face time with the patient discussing conditions above, post visit ordering of testing, clinical documentation with the electronic health record, making appropriate referrals as documented, and communicating necessary information to the patient's healthcare team.   Lauraine JULIANNA Lites, NP 02/29/2024  5:33 PM

## 2024-02-29 NOTE — Telephone Encounter (Signed)
 Letter given by th nurse case [id number] will send to [facility] to check the auth

## 2024-03-01 ENCOUNTER — Inpatient Hospital Stay

## 2024-03-01 ENCOUNTER — Telehealth: Payer: Self-pay | Admitting: Oncology

## 2024-03-01 ENCOUNTER — Inpatient Hospital Stay: Admitting: Oncology

## 2024-03-01 ENCOUNTER — Telehealth: Payer: Self-pay

## 2024-03-01 ENCOUNTER — Ambulatory Visit: Admitting: Internal Medicine

## 2024-03-01 NOTE — Telephone Encounter (Signed)
 Need clearance for procedure on Monday,attempted to fax got no answer

## 2024-03-01 NOTE — Telephone Encounter (Signed)
 Faxed: 02/29/2024           Contact Phone: [phone]   Pre-Operative Risk Assessment   Patient: Tyland Klemens                MRN: [id number]           DOB: 13-Dec-1955 Procedure: Bronchoscopy with Biopsy X Preoperative Risk Assessment Needed (See Below) __  Preoperative Risk Assessment not needed (FYI Only)   Dear Dr. Everlene              Phone: [phone]     [phone]   Your patient is planning to undergo the above procedure.  We would appreciate your evaluation and opinion on risk assessment.  We can NOT  Perform the surgery until this assessment is received by us .   Thank you for allowing us  to care for your patient.   Sincerely,   Lamar Chris, MD   Please check all that apply __  Optimized for surgery from a medical and cardiac standpoint __  Optimized for surgery from a medical standpoint only __  Optimized for surgery from a cardiac standpoint only __  HgB A1C is _______ (Less than 7.5 is required for surgery) __  BMI is _______ (Less than 40 is required for surgery) X Anticoagulation / Medication:Eliquis is ok to stop       2 days prior to surgery   **Please send recent notes, labs, EKG or special studies with this form   Physician Signature:_________________________________________Date:_________________   Additional Instructions:Patient will be undergoing a bronchoscopy with biopsy on 03/06/2024,this will be done under general anaesthesia,will need clearance for procedure to hold eliquis 48 hours prior to procedure    Authorization for release of medical records:    Patient Signature:Thor Elpers Date:02/29/2024 **Please return completed evaluation and any pertinent records to us  at our fax at

## 2024-03-01 NOTE — Telephone Encounter (Signed)
 Pt missed appt today, I called and spoke with pt and appt is r/s to Friday 10/10. Pt has a [place] appt that morning at 8:20am. I told pt that they can print his AVS for him and it will have our address on the paper. Pt stated okay

## 2024-03-03 ENCOUNTER — Ambulatory Visit (INDEPENDENT_AMBULATORY_CARE_PROVIDER_SITE_OTHER)

## 2024-03-03 ENCOUNTER — Inpatient Hospital Stay: Attending: Oncology | Admitting: Oncology

## 2024-03-03 ENCOUNTER — Other Ambulatory Visit

## 2024-03-03 ENCOUNTER — Other Ambulatory Visit: Payer: Self-pay

## 2024-03-03 ENCOUNTER — Telehealth: Payer: Self-pay

## 2024-03-03 ENCOUNTER — Encounter: Payer: Self-pay | Admitting: *Deleted

## 2024-03-03 ENCOUNTER — Encounter (HOSPITAL_COMMUNITY): Payer: Self-pay | Admitting: Emergency Medicine

## 2024-03-03 ENCOUNTER — Encounter: Payer: Self-pay | Admitting: Oncology

## 2024-03-03 VITALS — BP 158/110 | HR 61 | Ht 71.0 in | Wt 198.0 lb

## 2024-03-03 VITALS — BP 155/108 | HR 56 | Temp 96.2°F | Resp 18 | Wt 198.3 lb

## 2024-03-03 DIAGNOSIS — R059 Cough, unspecified: Secondary | ICD-10-CM | POA: Diagnosis not present

## 2024-03-03 DIAGNOSIS — R9389 Abnormal findings on diagnostic imaging of other specified body structures: Secondary | ICD-10-CM | POA: Diagnosis not present

## 2024-03-03 DIAGNOSIS — Z79899 Other long term (current) drug therapy: Secondary | ICD-10-CM | POA: Insufficient documentation

## 2024-03-03 DIAGNOSIS — R1313 Dysphagia, pharyngeal phase: Secondary | ICD-10-CM | POA: Diagnosis not present

## 2024-03-03 DIAGNOSIS — I2699 Other pulmonary embolism without acute cor pulmonale: Secondary | ICD-10-CM | POA: Diagnosis not present

## 2024-03-03 DIAGNOSIS — R918 Other nonspecific abnormal finding of lung field: Secondary | ICD-10-CM | POA: Diagnosis not present

## 2024-03-03 DIAGNOSIS — R911 Solitary pulmonary nodule: Secondary | ICD-10-CM

## 2024-03-03 DIAGNOSIS — F17210 Nicotine dependence, cigarettes, uncomplicated: Secondary | ICD-10-CM | POA: Diagnosis not present

## 2024-03-03 DIAGNOSIS — I158 Other secondary hypertension: Secondary | ICD-10-CM

## 2024-03-03 DIAGNOSIS — R948 Abnormal results of function studies of other organs and systems: Secondary | ICD-10-CM | POA: Insufficient documentation

## 2024-03-03 NOTE — Assessment & Plan Note (Signed)
 Intense focus of hypermetabolism identified along the midline mucosa of the posterior nasopharynx Recommend ENT evaluation

## 2024-03-03 NOTE — Progress Notes (Signed)
 Anesthesia Chart Review: SAME DAY WORK-UP  Case: [id number] Date/Time: 03/06/24 0730   Procedures:      VIDEO BRONCHOSCOPY WITH ENDOBRONCHIAL NAVIGATION (Left) - Left lower lobe     ENDOBRONCHIAL ULTRASOUND (EBUS) (Left)   Anesthesia type: General   Diagnosis: Lung nodule seen on imaging study [R91.1]   Pre-op diagnosis: lung nodule   [facility] / [facility]   Surgeons: Shelah Lamar RAMAN, MD       DISCUSSION: Patient is a 68 year old male scheduled for the above procedure.  He had a suspicious LLL lung lesion on recent cancer screening chest CT.  Nodule was hypermetabolic on PET scan.  Above procedure recommended. He also underwent a CT soft tissue neck for hypermetabolic activity along the midline posterior nasopharynx. Nonspecific mucosal enhancement noted, recommend ENT referral and direct visualization to exclude a mucosal neoplasm.  Other history includes smoking, HTN, PE (09/08/2025), CAD (CTO LCX with collaterals 12/2021), prediabetes, tremors, depression, osteoarthritis (right THA 08/23/2023)  His initial visit with cardiologist Dr. Mady was on 02/02/2024. S/p LHC 12/2021 showing CTO LCx with left to left and right to left collaterals.  There was up to 50% RCA stenosis and mild to moderate disease of the LAD and left main.  He wrote, Jesse [name] reports sporadic vague chest discomfort, mostly with the sensation of needing to cough, as well as chronic exertional dyspnea that has been present for couple of years. I think he would benefit most from aggressive medical therapy and improved blood pressure control. We have agreed to resume amlodipine  5 mg daily and continue his current regimen of aspirin , pravastatin , and propranolol . Six week follow-up planned.    He had primary care follow-up with Zafirov, Clarissa A, MD on 03/03/2024. She noted plans for bronchoscopy. Plan to referral to ENT for better evaluate vocal cords, and consider swallow study for dysphagia. BP was  ~150's/100-110. She thought stress related to job search and financial issues were contributing and did not make any medication changes--he is on amlodipine  5 mg daily. He also takes propranolol  for tremors.  She also discussed starting Eliquis for history of PE (2017), although advised to hold for bronchoscopy (he has not started yet)  His last ASA was reported as 03/01/2024.  Advised to take amlodipine  and propranolol  on the morning of surgery he is on propranolol  for tremor.   [name] a same-day workup, so anesthesia team to evaluate on the day of surgery.  He will get vitals on arrival.   VS:  Wt Readings from Last 3 Encounters:  03/03/24 89.9 kg  03/03/24 89.8 kg  02/29/24 88.4 kg   BP Readings from Last 3 Encounters:  03/03/24 (!) 155/108  03/03/24 (!) 158/110  02/29/24 (!) 145/102   Pulse Readings from Last 3 Encounters:  03/03/24 (!) 56  03/03/24 61  02/29/24 65     PROVIDERS: Everlene Parris LABOR, MD is PCP  Mady Bruckner, MD is cardiologist   LABS:  Lab Results  Component Value Date   WBC 5.8 02/17/2024   HGB 14.4 02/17/2024   HCT 43.4 02/17/2024   PLT 226 02/17/2024   GLUCOSE 97 02/17/2024   CHOL 136 02/17/2024   TRIG 155 (H) 02/17/2024   HDL 29 (L) 02/17/2024   LDLCALC 82 02/17/2024   ALT 9 02/17/2024   AST 17 02/17/2024   NA 140 02/17/2024   K 4.2 02/17/2024   CL 107 02/17/2024   CREATININE 1.14 02/17/2024   BUN 9 02/17/2024  CO2 25 02/17/2024   PSA 1.24 02/17/2024   HGBA1C 5.9 (H) 02/17/2024    IMAGES: CT soft tissue neck 02/23/2024: IMPRESSION: 1. Nonspecific mucosal enhancement at the midline posterior nasopharynx at site of the hypermetabolic activity described on yesterday's PET-CT. ENT referral and direct visualization are recommended to exclude a mucosal neoplasm. 2. No pathologically enlarged lymph nodes within the neck. 3. Postinflammatory calcifications and/or tonsilloliths within the palatine tonsils.  PET Scan  02/21/2024: IMPRESSION: 1. 25.4 mm left lower lobe pulmonary nodule identified on previous lung cancer screening study is intensely hypermetabolic, consistent with primary bronchogenic neoplasm. 2. Subtle low level nodular FDG uptake identified in the hilar regions bilaterally without underlying discrete lymphadenopathy. In retrospect, a tiny 7 mm nodule is identified in the right suprahilar upper lobe nestled in a bronchovascular bundle. This cannot be separated from the adjacent vascularity but does generates some mass-effect on a bronchus immediately posterior to the finding. There is some low level FDG uptake in this region but is considered nonspecific as multiple additional similar tiny foci of low level FDG uptake are identified in the parahilar regions bilaterally. Close attention on follow-up recommended. 3. Intense focus of hypermetabolism identified along the midline mucosa of the posterior nasopharynx. No underlying mass lesion evident by noncontrast CT imaging. Direct visualization recommended to exclude mucosal lesion. 4. No evidence for hypermetabolic metastatic disease in the abdomen or pelvis. 5.  Aortic Atherosclerosis (ICD10-I70.0).  CT Chest LCS 02/03/2024: IMPRESSION: 1. 25.4 mm left lower lobe nodule with distal mucoid impaction. Lung-RADS 4B, suspicious. Additional imaging evaluation or consultation with Pulmonology or Thoracic Surgery recommended. These results will be called to the ordering clinician or representative by the Radiologist Assistant, and communication documented in the PACS or [employer]. 2. Mild cylindrical bronchiectasis. 3. Three-vessel coronary artery calcification. 4.  Emphysema (ICD10-J43.9).   EKG: 02/02/2024: NSR   CV: Cardiac cath 12/26/2021 ([facility]): CORONARY ANATOMY:  1.  Left main artery is a large artery with diffuse mild to moderate  disease.  2. Left anterior descending artery is a large artery that does not reach   the apex with diffuse mild to moderate disease.  The first and second  diagonal branches are medium sized vessels with diffuse mild disease.  3.  Left circumflex artery is a large artery with chronic total occlusion  in the proximal segment.  There are robust left to left collaterals to a  large obtuse marginal branch and small right to left collaterals with  competitive flow.  4.  Right coronary artery is a large dominant artery with diffuse moderate  disease up to 50%.  SUMMARY:  1. 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  2.  LVEDP 7 mmHg  3.  Likely elevated troponin in the setting of demand ischemia, type II MI   PLAN:  1. Continue to trend troponin to peak  2.  Heparin drip until troponin peaks  3.  If the patient continues to have continuing signs of ischemia or  concerning symptoms, can consider CTO PCI of the left circumflex  4.  Continue aspirin , statin  5.  Beta-blocker may not be tolerated due to bradycardia    Echo 12/25/2021 ([facility]): IMPRESSION: 1.  Left ventricle: The cavity size is normal. Wall thickness is mildly increased. Systolic function is normal. The estimated ejection fraction is 65%, by the Teichholz method. Wall motion is normal; there are no regional wall motion abnormalities. Normal diastolic  function.  2.  Right ventricle: The cavity size is normal. Wall thickness is normal. Systolic function is normal.   Past Medical History:  Diagnosis Date   Coronary artery disease    Depression 02/15/2014   Hypertension    Myocardial infarction [facility])    NSTEMI   Pre-diabetes    Pulmonary embolism (HCC)    Sleep disturbance 02/15/2014   Tremors of nervous system    Trochanteric bursitis of left hip 08/30/2018    Past Surgical History:  Procedure Laterality Date   HIP SURGERY Bilateral    TOTAL HIP ARTHROPLASTY Bilateral     MEDICATIONS: No current facility-administered medications  for this encounter.    amLODipine  (NORVASC ) 5 MG tablet   apixaban (ELIQUIS) 2.5 MG TABS tablet   aspirin  81 MG chewable tablet   buPROPion  (WELLBUTRIN  SR) 150 MG 12 hr tablet   nicotine  (NICODERM CQ  - DOSED IN MG/24 HOURS) 21 mg/24hr patch   nitroGLYCERIN  (NITROSTAT ) 0.4 MG SL tablet   pravastatin  (PRAVACHOL ) 40 MG tablet   propranolol  ER (INDERAL  LA) 60 MG 24 hr capsule    Tura Roller, PA-C Surgical Short Stay/Anesthesiology [facility] Phone [phone] [facility] Phone [phone] 03/03/2024 4:12 PM

## 2024-03-03 NOTE — Progress Notes (Signed)
 Complex Care Management Note  Care Guide Note 03/03/2024 Name: Jesse [name] MRN: [id number] DOB: [birth year]  Jesse [name] is a 68 y.o. year old male who sees Zafirov, Parris LABOR, MD for primary care. I reached out to [name] by phone today to offer complex care management services.  [name] was given information about Complex Care Management services today including:   The Complex Care Management services include support from the care team which includes your Nurse Care Manager, Clinical Social Worker, or Pharmacist.  The Complex Care Management team is here to help remove barriers to the health concerns and goals most important to you. Complex Care Management services are voluntary, and the patient may decline or stop services at any time by request to their care team member.   Complex Care Management Consent Status: Patient agreed to services and verbal consent obtained.   Follow up plan:  Telephone appointment with complex care management team member scheduled for:  BSW 03/08/2024 RNCM 03/16/2024  Encounter Outcome:  Patient Scheduled  Jeoffrey Buffalo , RMA     [place]  [facility], [facility] Guide  Direct Dial: [phone]  Website: [email or web address]

## 2024-03-03 NOTE — Progress Notes (Signed)
 Progress Note  Physician: Geovana Gebel A Myrakle Wingler, MD   HPI: Jesse [name] is a 68 y.o. male presenting on 03/03/2024 for Follow-up .  Discussed the use of AI scribe software for clinical note transcription with the patient, who gave verbal consent to proceed.  History of Present Illness   Jesse [name] is a 68 year old male with a lung nodule and coronary artery disease who presents for follow-up.  Pulmonary nodule and abnormal imaging findings - Lung nodule with positive FDG uptake on PET scan - Scheduled for bronchoscopy with biopsy next week  Dysphagia and upper aerodigestive symptoms - Difficulty swallowing characterized by globus sensation with intermittent obstruction - Occasional regurgitation of liquids - CT of the neck performed - PET scan showed mucosal enhancement - Referred to ENT for further evaluation  Cardiovascular disease and anticoagulation - Coronary artery disease with non-ST elevation myocardial infarction (NSTEMI) three years ago - Remains on amlodipine  5 mg daily for hypertension and propranolol .  BP 158/110 in office.  Reports high stressors today.   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 Interim medical history since our last visit reviewed.  Allergies and medications reviewed and updated.   ROS: Negative unless specifically indicated above in HPI.    Current Outpatient Medications:    amLODipine  (NORVASC ) 5 MG tablet, Take 1 tablet (5 mg total) by mouth daily., Disp: 90 tablet, Rfl: 3   apixaban (ELIQUIS) 2.5 MG TABS tablet, Take 1 tablet (2.5 mg total) by mouth 2 (two) times daily., Disp: 180 tablet, Rfl: 1   aspirin  81 MG chewable  tablet, Chew 81 mg by mouth daily., Disp: , Rfl:    buPROPion  (WELLBUTRIN  SR) 150 MG 12 hr tablet, Take 1 tablet (150 mg total) by mouth 2 (two) times daily., Disp: 90 tablet, Rfl: 1   nicotine  (NICODERM CQ  - DOSED IN MG/24 HOURS) 21 mg/24hr patch, Place 1 patch (21 mg total) onto the skin daily., Disp: 90 patch, Rfl: 3   nitroGLYCERIN  (NITROSTAT ) 0.4 MG SL tablet, Place 1 tablet (0.4 mg total) under the tongue every 5 (five) minutes as needed for chest pain., Disp: 25 tablet, Rfl: 3   pravastatin  (PRAVACHOL ) 40 MG tablet, Take 1 tablet (40 mg total) by mouth daily., Disp: 90 tablet, Rfl: 1   propranolol  ER (INDERAL  LA) 60 MG 24 hr capsule, Take 1 capsule (60 mg total) by mouth daily. For tremor, Disp: 30 capsule, Rfl: 1       Objective:     BP (!) 158/110   Pulse 61   Ht 5' 11 (1.803 m)   Wt 198 lb (89.8 kg)   SpO2 99%   BMI 27.62 kg/m   Wt Readings from Last 3 Encounters:  03/03/24 198 lb (89.8 kg)  02/29/24 194 lb 12.8 oz (88.4 kg)  02/23/24 194 lb 9.6 oz (88.3 kg)    Physical Exam  Physical Exam Vitals reviewed.  Constitutional:      Appearance: Normal appearance. Well-developed with normal weight.  Cardiovascular:     Rate and Rhythm: Normal rate and regular rhythm. Normal heart sounds. Normal peripheral pulses Pulmonary:     Normal breath sounds with normal effort  Skin:    General: Skin is warm and dry without noticeable rash. Neurological:     General: No focal deficit present.  Psychiatric:        Mood and Affect: Mood, behavior and cognition normal      Assessment & Plan:   Encounter Diagnoses  Name Primary?   Pulmonary nodule, left Yes   PE (pulmonary thromboembolism) (HCC)    Other secondary hypertension    Pharyngeal dysphagia     Orders Placed This Encounter  Procedures   Ambulatory referral to ENT   AMB Referral VBCI Care Management     Assessment and Plan    Pulmonary nodule with FDG uptake Pulmonary nodule with increased FDG uptake on PET  scan, indicating potential malignancy. - Schedule bronchoscopy with biopsy on Monday to obtain a definitive diagnosis. - Instruct to withhold Eliquis prior to the procedure due to bleeding risk.  Pulmonary embolism Unprovoked pulmonary embolism, currently on Eliquis. Increased risk of recurrence due to potential pulmonary malignancy. Tolerating Eliquis well.  Dysphagia Dysphagia with sensation of globus and occasional regurgitation of liquids. CT of the neck shows no abnormalities, and PET scan shows mucosal enhancement but no mass. - Refer to ENT for evaluation of vocal cords and possible swallow study.  Hypertension Hypertension managed with amlodipine  5 mg daily. Reports elevated blood pressure readings at home, likely due to stress related to job search and financial issues. - Continue amlodipine  5 mg daily. - Monitor blood pressure at home.  Financial difficulties impacting access to healthy food options Financial difficulties impacting access to healthy food options. - Consult [place] financial assistance services to explore available resources.

## 2024-03-03 NOTE — Progress Notes (Signed)
 PCP - Parris DELENA Juneau, MD Cardiologist - Lonni Hanson, MD  EKG - 02/02/24 ECHO - 12/25/21 Cardiac Cath - 12/26/21  Blood Thinner Instructions: Eliquis hasn't started it Aspirin  Instructions: ASA last dose 03/01/24  Anesthesia review: Y  Patient verbally denies any shortness of breath, fever, cough and chest pain during phone call   -------------  SDW INSTRUCTIONS given:  Your procedure is scheduled on Monday, Oct 10th.  Report to [facility] Main Entrance A at 0530 A.M., and check in at the Admitting office.  Call this number if you have problems the morning of surgery:  [phone]   Remember:  Do not eat or drink after midnight the night before your surgery     Take these medicines the morning of surgery with A SIP OF WATER  amLODipine  (NORVASC )  buPROPion  (WELLBUTRIN  SR)  pravastatin  (PRAVACHOL )  propranolol  ER (INDERAL  LA)   As of today, STOP taking any Aspirin  (unless otherwise instructed by your surgeon) Aleve, Naproxen, Ibuprofen, Motrin, Advil, Goody's, BC's, all herbal medications, fish oil, and all vitamins.                      Do not wear jewelry, make up, or nail polish            Do not wear lotions, powders, perfumes/colognes, or deodorant.            Do not shave 48 hours prior to surgery.  Men may shave face and neck.            Do not bring valuables to the hospital.            [facility] is not responsible for any belongings or valuables.  Do NOT Smoke (Tobacco/Vaping) 24 hours prior to your procedure If you use a CPAP at night, you may bring all equipment for your overnight stay.   Contacts, glasses, dentures or bridgework may not be worn into surgery.      For patients admitted to the hospital, discharge time will be determined by your treatment team.   Patients discharged the day of surgery will not be allowed to drive home, and someone needs to stay with them for 24 hours.    Special instructions:   [place]- Preparing For  Surgery  Before surgery, you can play an important role. Because skin is not sterile, your skin needs to be as free of germs as possible. You can reduce the number of germs on your skin by washing with CHG (chlorahexidine gluconate) Soap before surgery.  CHG is an antiseptic cleaner which kills germs and bonds with the skin to continue killing germs even after washing.    Oral Hygiene is also important to reduce your risk of infection.  Remember - BRUSH YOUR TEETH THE MORNING OF SURGERY WITH YOUR REGULAR TOOTHPASTE  Please do not use if you have an allergy to CHG or antibacterial soaps. If your skin becomes reddened/irritated stop using the CHG.  Do not shave (including legs and underarms) for at least 48 hours prior to first CHG shower. It is OK to shave your face.  Please follow these instructions carefully.   Shower the NIGHT BEFORE SURGERY and the MORNING OF SURGERY with DIAL Soap.   Pat yourself dry with a CLEAN TOWEL.  Wear CLEAN PAJAMAS to bed the night before surgery  Place CLEAN SHEETS on your bed the night of your first shower and DO NOT SLEEP WITH PETS.   Day of Surgery: Please  shower morning of surgery  Wear Clean/Comfortable clothing the morning of surgery Do not apply any deodorants/lotions.   Remember to brush your teeth WITH YOUR REGULAR TOOTHPASTE.   Questions were answered. Patient verbalized understanding of instructions.

## 2024-03-03 NOTE — Assessment & Plan Note (Addendum)
 Imaging results were reviewed and discussed with patient. Left lung mass increase in size, hypermetabolic activity on PET scan.  Concerning for primary lung cancer.  Subtle nodular FDG uptake in the hilar area without discrete lymphadenopathy. He has been referred to pulmonology for evaluation of biopsy via bronchoscopy. He has also been referred to thoracic surgery for evaluation of feasibility of resection Further recommendation pending biopsy results.

## 2024-03-03 NOTE — Progress Notes (Signed)
 Hematology/Oncology Consult Note [phone] [phone]     REFERRING PROVIDER: Everlene Parris LABOR, MD    CHIEF COMPLAINTS/PURPOSE OF CONSULTATION:  Left lung mass  ASSESSMENT & PLAN:   Mass of left lung Imaging results were reviewed and discussed with patient. Left lung mass increase in size, hypermetabolic activity on PET scan.  Concerning for primary lung cancer.  Subtle nodular FDG uptake in the hilar area without discrete lymphadenopathy. He has been referred to pulmonology for evaluation of biopsy via bronchoscopy. He has also been referred to thoracic surgery for evaluation of feasibility of resection Further recommendation pending biopsy results.  Abnormal positron emission tomography (PET) scan Intense focus of hypermetabolism identified along the midline mucosa of the posterior nasopharynx Recommend ENT evaluation   No orders of the defined types were placed in this encounter.  Patient will follow-up after biopsy is done. All questions were answered. The patient knows to call the clinic with any problems, questions or concerns.  Zelphia Cap, MD, PhD [facility] Hematology Oncology 03/03/2024    HISTORY OF PRESENTING ILLNESS:  Jesse [name] 68 y.o. male presents to establish care for left lung mass  02/13/2024 CT chest normal cancer screening 1. 25.4 mm left lower lobe nodule with distal mucoid impaction. Lung-RADS 4B, suspicious. Additional imaging evaluation or consultation with Pulmonology or Thoracic Surgery recommended. These results will be called to the ordering clinician or representative by the Radiologist Assistant, and communication documented in the PACS or [employer]. 2. Mild cylindrical bronchiectasis. 3. Three-vessel coronary artery calcification. 4.  Emphysema   02/18/2024 1. 25.4 mm left lower lobe pulmonary nodule identified on previous lung cancer screening study is intensely hypermetabolic, consistent with  primary bronchogenic neoplasm. 2. Subtle low level nodular FDG uptake identified in the hilar regions bilaterally without underlying discrete lymphadenopathy. In retrospect, a tiny 7 mm nodule is identified in the right suprahilar upper lobe nestled in a bronchovascular bundle. This cannot be separated from the adjacent vascularity but does generates some mass-effect on a bronchus immediately posterior to the finding. There is some low level FDG uptake in this region but is considered nonspecific as multiple additional similar tiny foci of low level FDG uptake are identified in the parahilar regions bilaterally. Close attention on follow-up recommended. 3. Intense focus of hypermetabolism identified along the midline mucosa of the posterior nasopharynx. No underlying mass lesion evident by noncontrast CT imaging. Direct visualization recommended  4. No evidence for hypermetabolic metastatic disease in the abdomen or pelvis. 5.  Aortic Atherosclerosis  02/23/2024 CT soft tissue neck with contrast showed  1. Nonspecific mucosal enhancement at the midline posterior nasopharynx at site of the hypermetabolic activity described on yesterday's PET-CT. ENT referral and direct visualization are recommended to exclude a mucosal neoplasm. 2. No pathologically enlarged lymph nodes within the neck. 3. Postinflammatory calcifications and/or tonsilloliths within the palatine tonsils   Patient reports feeling well today.  He has extensive smoking history.  Recently he has been working on cutting back on smoking.  Some chronic cough.  Denies any hemoptysis.  No unintentional weight loss, fever, chills, night sweats, fatigue.   MEDICAL HISTORY:  Past Medical History:  Diagnosis Date   Coronary artery disease    Depression 02/15/2014   Hypertension    Myocardial infarction [facility])    NSTEMI   Pre-diabetes    Pulmonary embolism (HCC)    Sleep disturbance 02/15/2014   Tremors of nervous system     Trochanteric bursitis of left hip 08/30/2018  SURGICAL HISTORY: Past Surgical History:  Procedure Laterality Date   HIP SURGERY Bilateral    TOTAL HIP ARTHROPLASTY Bilateral     SOCIAL HISTORY: Social History   Socioeconomic History   Marital status: Divorced    Spouse name: Not on file   Number of children: Not on file   Years of education: Not on file   Highest education level: Not on file  Occupational History   Not on file  Tobacco Use   Smoking status: Every Day    Current packs/day: 0.65    Average packs/day: 0.7 packs/day for 36.8 years (23.9 ttl pk-yrs)    Types: Cigarettes    Start date: 1989   Smokeless tobacco: Never   Tobacco comments:    trying to quit smoking,1 cigarettes a day KRD 02/29/2024  Vaping Use   Vaping status: Never Used  Substance and Sexual Activity   Alcohol use: No   Drug use: No   Sexual activity: Not Currently  Other Topics Concern   Not on file  Social History Narrative   Not on file   Social Drivers of Health   Financial Resource Strain: Low Risk  (03/03/2024)   Overall Financial Resource Strain (CARDIA)    Difficulty of Paying Living Expenses: Not very hard  Food Insecurity: No Food Insecurity (03/03/2024)   Hunger Vital Sign    Worried About Running Out of Food in the Last Year: Never true    Ran Out of Food in the Last Year: Never true  Transportation Needs: No Transportation Needs (03/03/2024)   PRAPARE - Administrator, Civil Service (Medical): No    Lack of Transportation (Non-Medical): No  Physical Activity: Unknown (03/01/2023)   Received from [facility]   Exercise Vital Sign    On average, how many days per week do you engage in moderate to strenuous exercise (like a brisk walk)?: 0 days    Minutes of Exercise per Session: Not on file  Stress: No Stress Concern Present (03/03/2024)   [employer] of Occupational Health - Occupational Stress Questionnaire    Feeling of Stress: Only a little   Social Connections: Socially Integrated (03/01/2023)   Received from [facility]   Social Network    How would you rate your social network (family, work, friends)?: Good participation with social networks  Intimate Partner Violence: Not At Risk (03/03/2024)   Humiliation, Afraid, Rape, and Kick questionnaire    Fear of Current or Ex-Partner: No    Emotionally Abused: No    Physically Abused: No    Sexually Abused: No    FAMILY HISTORY: Family History  Problem Relation Age of Onset   Diabetes Mother    Diabetes Father    Coronary artery disease Father 15       stent   Healthy Sister    Healthy Sister    Healthy Sister    Lung disease Brother    Healthy Brother    Healthy Brother    Healthy Daughter    Healthy Son    Breast cancer Neg Hx     ALLERGIES:  is allergic to bactrim [sulfamethoxazole-trimethoprim] and penicillins.  MEDICATIONS:  Current Outpatient Medications  Medication Sig Dispense Refill   amLODipine  (NORVASC ) 5 MG tablet Take 1 tablet (5 mg total) by mouth daily. 90 tablet 3   apixaban (ELIQUIS) 2.5 MG TABS tablet Take 1 tablet (2.5 mg total) by mouth 2 (two) times daily. 180 tablet 1   aspirin  81 MG chewable tablet  Chew 81 mg by mouth daily.     buPROPion  (WELLBUTRIN  SR) 150 MG 12 hr tablet Take 1 tablet (150 mg total) by mouth 2 (two) times daily. 90 tablet 1   nicotine  (NICODERM CQ  - DOSED IN MG/24 HOURS) 21 mg/24hr patch Place 1 patch (21 mg total) onto the skin daily. 90 patch 3   nitroGLYCERIN  (NITROSTAT ) 0.4 MG SL tablet Place 1 tablet (0.4 mg total) under the tongue every 5 (five) minutes as needed for chest pain. 25 tablet 3   pravastatin  (PRAVACHOL ) 40 MG tablet Take 1 tablet (40 mg total) by mouth daily. 90 tablet 1   propranolol  ER (INDERAL  LA) 60 MG 24 hr capsule Take 1 capsule (60 mg total) by mouth daily. For tremor 30 capsule 1   No current facility-administered medications for this visit.    Review of Systems - Oncology   PHYSICAL  EXAMINATION: ECOG PERFORMANCE STATUS: 1 - Symptomatic but completely ambulatory  Vitals:   03/03/24 1112 03/03/24 1125  BP: (!) 158/102 (!) 155/108  Pulse: (!) 56   Resp: 18   Temp: (!) 96.2 F (35.7 C)   SpO2: 100%    Filed Weights   03/03/24 1112  Weight: 198 lb 4.8 oz (89.9 kg)    Physical Exam   LABORATORY DATA:  I have reviewed the data as listed    Latest Ref Rng & Units 02/17/2024   11:46 AM 09/14/2023    5:54 PM 04/23/2015   10:09 AM  CBC  WBC 3.8 - 10.8 Thousand/uL 5.8  10.1  6.4   Hemoglobin 13.2 - 17.1 g/dL 85.5  84.8  83.9   Hematocrit 38.5 - 50.0 % 43.4  44.4  46.4   Platelets 140 - 400 Thousand/uL 226  355  226       Latest Ref Rng & Units 02/17/2024   11:46 AM 10/27/2023    1:31 PM 09/15/2023    5:57 AM  CMP  Glucose 65 - 99 mg/dL 97  872  895   BUN 7 - 25 mg/dL 9  7  19    Creatinine 0.70 - 1.35 mg/dL 8.85  8.95  8.46   Sodium 135 - 146 mmol/L 140  140  137   Potassium 3.5 - 5.3 mmol/L 4.2  4.4  4.1   Chloride 98 - 110 mmol/L 107  107  114   CO2 20 - 32 mmol/L 25  25  19    Calcium 8.6 - 10.3 mg/dL 9.2  9.3  8.1   Total Protein 6.1 - 8.1 g/dL 7.4     Total Bilirubin 0.2 - 1.2 mg/dL 0.5     AST 10 - 35 U/L 17     ALT 9 - 46 U/L 9        RADIOGRAPHIC STUDIES: I have personally reviewed the radiological images as listed and agreed with the findings in the report. CT SOFT TISSUE NECK W CONTRAST Result Date: 02/23/2024 CLINICAL DATA:  Provided history: Pulmonary nodule, left. Pharyngeal dysphagia. Head/neck cancer, staging. Additional history provided: Positive area on PET scan with history of dysphagia. EXAM: CT NECK WITH CONTRAST TECHNIQUE: Multidetector CT imaging of the neck was performed using the standard protocol following the bolus administration of intravenous contrast. RADIATION DOSE REDUCTION: This exam was performed according to the departmental dose-optimization program which includes automated exposure control, adjustment of the mA and/or kV  according to patient size and/or use of iterative reconstruction technique. CONTRAST:  75mL OMNIPAQUE IOHEXOL 300 MG/ML  SOLN COMPARISON:  Head CT 02/21/2024. FINDINGS: Pharynx and larynx: Nonspecific mucosal enhancement at the midline posterior nasopharynx at site of the hypermetabolic activity described on yesterday's PET-CT (for instance as seen on series 2, images 22-25). No appreciable swelling or mass elsewhere within the oral cavity pharynx or larynx. Small calcific foci within the palatine tonsils bilaterally, which could reflect postinflammatory calcifications and/or tonsilloliths. Salivary glands: Unremarkable. Thyroid: Left thyroid lobe nodules measuring up to 9 mm, not meeting consensus criteria for ultrasound follow-up based on size. No follow-up imaging recommended. Reference: J Am Coll Radiol. 2015 Feb;12(2): 143-50. Lymph nodes: No pathologically enlarged lymph node identified. Vascular: The major vascular structures of the neck are patent. Nonstenotic calcified plaque at the left vertebral artery origin and about the left carotid bifurcation. Atherosclerotic plaque within the intracranial internal carotid arteries. Limited intracranial: No evidence of an acute intracranial abnormality within the field of view. Visualized orbits: No orbital mass or acute orbital finding. Mastoids and visualized paranasal sinuses: Portions of the frontal sinuses are excluded from the field of view superiorly. No significant paranasal sinus disease or mastoid effusion at the imaged levels. Skeleton: Spondylosis at the cervical and visible thoracic levels. No acute fracture or aggressive osseous lesion. Upper chest: Please refer to the PET CT performed yesterday for description of intrathoracic findings. Impression #1 will be called to the ordering clinician or representative by the Radiologist Assistant, and communication documented in the PACS or [employer]. IMPRESSION: 1. Nonspecific mucosal enhancement at the  midline posterior nasopharynx at site of the hypermetabolic activity described on yesterday's PET-CT. ENT referral and direct visualization are recommended to exclude a mucosal neoplasm. 2. No pathologically enlarged lymph nodes within the neck. 3. Postinflammatory calcifications and/or tonsilloliths within the palatine tonsils. Electronically Signed   By: Rockey Childs D.O.   On: 02/23/2024 16:46   NM PET Image Initial (PI) Skull Base To Thigh Result Date: 02/22/2024 CLINICAL DATA:  Initial treatment strategy for pulmonary nodule. EXAM: NUCLEAR MEDICINE PET SKULL BASE TO THIGH TECHNIQUE: 10.8 mCi F-18 FDG was injected intravenously. Full-ring PET imaging was performed from the skull base to thigh after the radiotracer. CT data was obtained and used for attenuation correction and anatomic localization. Fasting blood glucose: 97 mg/dl COMPARISON:  Chest CT 90/88/7974 FINDINGS: Mediastinal blood pool activity: SUV max 10.8 Liver activity: SUV max 97 NECK: Intense focus of hypermetabolism identified along the midline mucosa of the posterior nasopharynx. No underlying mass lesion evident by noncontrast CT imaging. No hypermetabolic lymphadenopathy in the neck. Incidental CT findings: None. CHEST: Left lower lobe pulmonary nodule measured 25.4 mm on recent lung cancer screening chest CT is intensely hypermetabolic with SUV max = 32.1. Areas of subtle low level nodular FDG uptake identified in the hilar regions bilaterally without underlying discrete hilar lymphadenopathy. In retrospect, there is a tiny 7 mm nodule in the right suprahilar upper lobe nestled in a bronchovascular bundle (see image 95/3 of the previous lung cancer screening exam). This cannot be separated from the adjacent vascularity but does generates some mass-effect on a bronchus immediately posterior to the finding. There is some low level FDG uptake in this region but is considered nonspecific as multiple additional similar tiny foci of low level FDG  uptake are identified in the parahilar regions bilaterally. No intensely hypermetabolic mediastinal or hilar lymph nodes evident. Incidental CT findings: None. ABDOMEN/PELVIS: No abnormal hypermetabolic activity within the liver, pancreas, adrenal glands, or spleen. No hypermetabolic lymph nodes in the abdomen or pelvis. Incidental CT findings: Renal and hepatic  cysts evident. Abdominal aorta measures up to 2.9 cm diameter with mild atherosclerotic calcific plaque evident. Prostate gland is enlarged. SKELETON: No focal hypermetabolic activity to suggest skeletal metastasis. Incidental CT findings: None. IMPRESSION: 1. 25.4 mm left lower lobe pulmonary nodule identified on previous lung cancer screening study is intensely hypermetabolic, consistent with primary bronchogenic neoplasm. 2. Subtle low level nodular FDG uptake identified in the hilar regions bilaterally without underlying discrete lymphadenopathy. In retrospect, a tiny 7 mm nodule is identified in the right suprahilar upper lobe nestled in a bronchovascular bundle. This cannot be separated from the adjacent vascularity but does generates some mass-effect on a bronchus immediately posterior to the finding. There is some low level FDG uptake in this region but is considered nonspecific as multiple additional similar tiny foci of low level FDG uptake are identified in the parahilar regions bilaterally. Close attention on follow-up recommended. 3. Intense focus of hypermetabolism identified along the midline mucosa of the posterior nasopharynx. No underlying mass lesion evident by noncontrast CT imaging. Direct visualization recommended to exclude mucosal lesion. 4. No evidence for hypermetabolic metastatic disease in the abdomen or pelvis. 5.  Aortic Atherosclerosis (ICD10-I70.0). Electronically Signed   By: Camellia Candle M.D.   On: 02/22/2024 05:54   CT CHEST LUNG CA SCREEN LOW DOSE W/O CM Result Date: 02/13/2024 CLINICAL DATA:  Current 23 pack-year  smoker. EXAM: CT CHEST WITHOUT CONTRAST LOW-DOSE FOR LUNG CANCER SCREENING TECHNIQUE: Multidetector CT imaging of the chest was performed following the standard protocol without IV contrast. RADIATION DOSE REDUCTION: This exam was performed according to the departmental dose-optimization program which includes automated exposure control, adjustment of the mA and/or kV according to patient size and/or use of iterative reconstruction technique. COMPARISON:  None Available. FINDINGS: Cardiovascular: Three-vessel coronary artery calcification. Heart size normal. No pericardial effusion. Mediastinum/Nodes: No pathologically enlarged mediastinal or axillary lymph nodes. Hilar regions are difficult to definitively evaluate without IV contrast. Esophagus is grossly unremarkable. Lungs/Pleura: Centrilobular and paraseptal emphysema. Mild cylindrical bronchiectasis. Left lower lobe nodule measures 25.4 mm (3/217) with distal mucoid impaction. No additional pulmonary nodules. No pleural fluid. Airway is unremarkable. Upper Abdomen: Low-attenuation lesions in the liver and kidneys. No specific follow-up necessary. Visualized portions of the liver, gallbladder, adrenal glands, kidneys, spleen, pancreas, stomach and bowel are otherwise grossly unremarkable. No upper abdominal adenopathy. Musculoskeletal: Degenerative changes in the spine. IMPRESSION: 1. 25.4 mm left lower lobe nodule with distal mucoid impaction. Lung-RADS 4B, suspicious. Additional imaging evaluation or consultation with Pulmonology or Thoracic Surgery recommended. These results will be called to the ordering clinician or representative by the Radiologist Assistant, and communication documented in the PACS or [employer]. 2. Mild cylindrical bronchiectasis. 3. Three-vessel coronary artery calcification. 4.  Emphysema (ICD10-J43.9). Electronically Signed   By: Newell Eke M.D.   On: 02/13/2024 16:01

## 2024-03-03 NOTE — Anesthesia Preprocedure Evaluation (Addendum)
 Anesthesia Evaluation  Patient identified by MRN, date of birth, ID band Patient awake    Reviewed: Allergy & Precautions, NPO status , Patient's Chart, lab work & pertinent test results  Airway Mallampati: II  TM Distance: >3 FB Neck ROM: Full    Dental no notable dental hx.    Pulmonary Current Smoker and Patient abstained from smoking.   Pulmonary exam normal        Cardiovascular hypertension, Pt. 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  2. LVEDP 7 mmHg  3. Likely elevated troponin in the setting of demand ischemia, type II MI  PLAN:  1.Continue to trend troponin to peak  2. Heparin drip until troponin peaks  3. If the patient continues to have continuing signs of ischemia or  concerning symptoms, can consider CTO PCI of the left circumflex  4. Continue aspirin , statin  5. Beta-blocker may not be tolerated due to bradycardia    Echo 12/25/2021 ([facility]): IMPRESSION: 1. Left ventricle: The cavity size is normal. Wall thickness is mildly increased. Systolic function is normal. The estimated ejection fraction is 65%, by the Teichholz method. Wall motion is normal; there are no regional wall motion abnormalities. Normal diastolic function.  2. Right ventricle: The cavity size is normal. Wall thickness is normal. Systolic function is normal.      Neuro/Psych    Depression    negative neurological ROS     GI/Hepatic negative GI ROS, Neg liver ROS,,,  Endo/Other  negative endocrine ROS    Renal/GU   negative genitourinary   Musculoskeletal negative musculoskeletal ROS (+)    Abdominal Normal abdominal exam  (+)   Peds  Hematology Lab Results      Component                Value               Date                      WBC                       5.8                 02/17/2024                HGB                      14.4                02/17/2024                HCT                      43.4                02/17/2024                MCV                      90.0                02/17/2024                PLT  226                 02/17/2024             Lab Results      Component                Value               Date                      NA                       140                 02/17/2024                K                        4.2                 02/17/2024                CO2                      25                  02/17/2024                GLUCOSE                  97                  02/17/2024                BUN                      9                   02/17/2024                CREATININE               1.14                02/17/2024                CALCIUM                  9.2                 02/17/2024                EGFR                     70                  02/17/2024                GFRNONAA                 >60                 10/27/2023              Anesthesia Other Findings   Reproductive/Obstetrics  Anesthesia Physical Anesthesia Plan  ASA: 3  Anesthesia Plan: General   Post-op Pain Management:    Induction: Intravenous  PONV Risk Score and Plan: 1 and Ondansetron , Dexamethasone, Midazolam and Treatment may vary due to age or medical condition  Airway Management Planned: Mask and Oral ETT  Additional Equipment: None  Intra-op Plan:   Post-operative Plan: Extubation in OR  Informed Consent: I have reviewed the patients History and Physical, chart, labs and discussed the procedure including the risks, benefits and alternatives for the proposed anesthesia with the patient or authorized representative who has indicated his/her understanding and acceptance.     Dental advisory given  Plan Discussed with:  CRNA  Anesthesia Plan Comments: (PAT note written 03/03/2024 by Allison Zelenak, PA-C.  )         Anesthesia Quick Evaluation

## 2024-03-06 ENCOUNTER — Encounter (HOSPITAL_COMMUNITY)
Admission: RE | Disposition: A | Discharge status: Home / Self Care | Payer: Self-pay | Source: Home / Self Care | Attending: Emergency Medicine

## 2024-03-06 ENCOUNTER — Ambulatory Visit (HOSPITAL_COMMUNITY): Payer: Self-pay | Admitting: Vascular Surgery

## 2024-03-06 ENCOUNTER — Observation Stay (HOSPITAL_COMMUNITY)
Admission: RE | Admit: 2024-03-06 | Discharge: 2024-03-07 | Disposition: A | Discharge status: Home / Self Care | Attending: Emergency Medicine | Admitting: Emergency Medicine

## 2024-03-06 ENCOUNTER — Ambulatory Visit (HOSPITAL_COMMUNITY)

## 2024-03-06 ENCOUNTER — Other Ambulatory Visit: Payer: Self-pay

## 2024-03-06 DIAGNOSIS — R911 Solitary pulmonary nodule: Secondary | ICD-10-CM | POA: Diagnosis present

## 2024-03-06 DIAGNOSIS — I2510 Atherosclerotic heart disease of native coronary artery without angina pectoris: Secondary | ICD-10-CM | POA: Insufficient documentation

## 2024-03-06 DIAGNOSIS — Z79899 Other long term (current) drug therapy: Secondary | ICD-10-CM | POA: Insufficient documentation

## 2024-03-06 DIAGNOSIS — Z122 Encounter for screening for malignant neoplasm of respiratory organs: Secondary | ICD-10-CM | POA: Diagnosis not present

## 2024-03-06 DIAGNOSIS — I10 Essential (primary) hypertension: Secondary | ICD-10-CM | POA: Diagnosis not present

## 2024-03-06 DIAGNOSIS — F17210 Nicotine dependence, cigarettes, uncomplicated: Secondary | ICD-10-CM | POA: Diagnosis not present

## 2024-03-06 DIAGNOSIS — R590 Localized enlarged lymph nodes: Secondary | ICD-10-CM | POA: Diagnosis not present

## 2024-03-06 DIAGNOSIS — C7A090 Malignant carcinoid tumor of the bronchus and lung: Principal | ICD-10-CM | POA: Insufficient documentation

## 2024-03-06 HISTORY — DX: Prediabetes: R73.03

## 2024-03-06 HISTORY — PX: VIDEO BRONCHOSCOPY WITH ENDOBRONCHIAL NAVIGATION: SHX6175

## 2024-03-06 HISTORY — DX: Atherosclerotic heart disease of native coronary artery without angina pectoris: I25.10

## 2024-03-06 HISTORY — PX: CRYOTHERAPY: SHX6894

## 2024-03-06 HISTORY — DX: Acute myocardial infarction, unspecified: I21.9

## 2024-03-06 HISTORY — PX: BRONCHIAL NEEDLE ASPIRATION BIOPSY: SHX5106

## 2024-03-06 HISTORY — PX: FUDUCIAL PLACEMENT: SHX5083

## 2024-03-06 HISTORY — PX: ENDOBRONCHIAL ULTRASOUND: SHX5096

## 2024-03-06 SURGERY — VIDEO BRONCHOSCOPY WITH ENDOBRONCHIAL NAVIGATION
Anesthesia: General | Laterality: Left

## 2024-03-06 MED ORDER — PRAVASTATIN SODIUM 40 MG PO TABS
40.0000 mg | ORAL_TABLET | Freq: Every day | ORAL | Status: DC
  Administered 2024-03-06 – 2024-03-07 (×2): 40 mg via ORAL
  Filled 2024-03-06 (×2): qty 1

## 2024-03-06 MED ORDER — PROPRANOLOL HCL ER 60 MG PO CP24
60.0000 mg | ORAL_CAPSULE | Freq: Every day | ORAL | Status: DC
  Administered 2024-03-06: 60 mg via ORAL
  Filled 2024-03-06 (×2): qty 1

## 2024-03-06 MED ORDER — GLYCOPYRROLATE PF 0.2 MG/ML IJ SOSY
PREFILLED_SYRINGE | INTRAMUSCULAR | Status: DC | PRN
  Administered 2024-03-06: .2 mg via INTRAVENOUS

## 2024-03-06 MED ORDER — PROPOFOL 500 MG/50ML IV EMUL
INTRAVENOUS | Status: DC | PRN
  Administered 2024-03-06: 150 ug/kg/min via INTRAVENOUS

## 2024-03-06 MED ORDER — LIDOCAINE 2% (20 MG/ML) 5 ML SYRINGE
INTRAMUSCULAR | Status: DC | PRN
  Administered 2024-03-06: 80 mg via INTRAVENOUS

## 2024-03-06 MED ORDER — SUGAMMADEX SODIUM 200 MG/2ML IV SOLN
INTRAVENOUS | Status: DC | PRN
  Administered 2024-03-06: 400 mg via INTRAVENOUS

## 2024-03-06 MED ORDER — BUPROPION HCL ER (SR) 150 MG PO TB12
150.0000 mg | ORAL_TABLET | Freq: Two times a day (BID) | ORAL | Status: DC
  Administered 2024-03-06 – 2024-03-07 (×3): 150 mg via ORAL
  Filled 2024-03-06 (×4): qty 1

## 2024-03-06 MED ORDER — ALBUTEROL SULFATE HFA 108 (90 BASE) MCG/ACT IN AERS
INHALATION_SPRAY | RESPIRATORY_TRACT | Status: DC | PRN
  Administered 2024-03-06: 4 via RESPIRATORY_TRACT

## 2024-03-06 MED ORDER — DROPERIDOL 2.5 MG/ML IJ SOLN: 0.6250 mg | Freq: Once | INTRAMUSCULAR | Status: DC | PRN

## 2024-03-06 MED ORDER — DEXAMETHASONE SOD PHOSPHATE PF 10 MG/ML IJ SOLN
INTRAMUSCULAR | Status: DC | PRN
  Administered 2024-03-06: 10 mg via INTRAVENOUS

## 2024-03-06 MED ORDER — ACETAMINOPHEN 10 MG/ML IV SOLN: 1000.0000 mg | Freq: Once | INTRAVENOUS | Status: DC | PRN

## 2024-03-06 MED ORDER — ONDANSETRON HCL 4 MG/2ML IJ SOLN
INTRAMUSCULAR | Status: DC | PRN
Start: 2024-03-06 — End: 2024-03-06
  Administered 2024-03-06: 4 mg via INTRAVENOUS

## 2024-03-06 MED ORDER — PHENYLEPHRINE HCL-NACL 20-0.9 MG/250ML-% IV SOLN
INTRAVENOUS | Status: DC | PRN
  Administered 2024-03-06: 40 ug/min via INTRAVENOUS

## 2024-03-06 MED ORDER — PROPOFOL 10 MG/ML IV BOLUS
INTRAVENOUS | Status: DC | PRN
  Administered 2024-03-06: 150 mg via INTRAVENOUS

## 2024-03-06 MED ORDER — ASPIRIN 81 MG PO CHEW
81.0000 mg | CHEWABLE_TABLET | Freq: Every day | ORAL | Status: DC
  Administered 2024-03-06 – 2024-03-07 (×2): 81 mg via ORAL
  Filled 2024-03-06 (×2): qty 1

## 2024-03-06 MED ORDER — FENTANYL CITRATE (PF) 100 MCG/2ML IJ SOLN: 25.0000 ug | INTRAMUSCULAR | Status: DC | PRN

## 2024-03-06 MED ORDER — ROCURONIUM BROMIDE 10 MG/ML (PF) SYRINGE
PREFILLED_SYRINGE | INTRAVENOUS | Status: DC | PRN
  Administered 2024-03-06: 60 mg via INTRAVENOUS
  Administered 2024-03-06: 30 mg via INTRAVENOUS
  Administered 2024-03-06: 20 mg via INTRAVENOUS

## 2024-03-06 MED ORDER — CHLORHEXIDINE GLUCONATE 0.12 % MT SOLN
OROMUCOSAL | Status: AC
  Administered 2024-03-06: 15 mL via OROMUCOSAL
  Filled 2024-03-06: qty 15

## 2024-03-06 MED ORDER — FENTANYL CITRATE (PF) 250 MCG/5ML IJ SOLN
INTRAMUSCULAR | Status: DC | PRN
  Administered 2024-03-06: 100 ug via INTRAVENOUS

## 2024-03-06 MED ORDER — CHLORHEXIDINE GLUCONATE 0.12 % MT SOLN: 15.0000 mL | Freq: Once | OROMUCOSAL | Status: AC

## 2024-03-06 MED ORDER — AMLODIPINE BESYLATE 5 MG PO TABS
5.0000 mg | ORAL_TABLET | Freq: Every day | ORAL | Status: DC
  Administered 2024-03-06 – 2024-03-07 (×2): 5 mg via ORAL
  Filled 2024-03-06 (×2): qty 1

## 2024-03-06 MED ORDER — LACTATED RINGERS IV SOLN: INTRAVENOUS | Status: DC

## 2024-03-06 MED ORDER — CIPROFLOXACIN HCL 500 MG PO TABS
500.0000 mg | ORAL_TABLET | Freq: Two times a day (BID) | ORAL | Status: DC
  Administered 2024-03-06 – 2024-03-07 (×3): 500 mg via ORAL
  Filled 2024-03-06 (×4): qty 1

## 2024-03-06 MED ORDER — MIDAZOLAM HCL 2 MG/2ML IJ SOLN
INTRAMUSCULAR | Status: DC | PRN
  Administered 2024-03-06: 2 mg via INTRAVENOUS

## 2024-03-06 MED ORDER — NITROGLYCERIN 0.4 MG SL SUBL: 0.4000 mg | SUBLINGUAL_TABLET | SUBLINGUAL | Status: DC | PRN

## 2024-03-06 MED ORDER — CIPROFLOXACIN HCL 500 MG PO TABS: 500.0000 mg | ORAL_TABLET | Freq: Two times a day (BID) | ORAL | 0 refills | Status: AC

## 2024-03-06 SURGICAL SUPPLY — 39 items
ADAPTER BRONCHOSCOPE OLYMPUS (ADAPTER) ×2 IMPLANT
ADAPTER VALVE BIOPSY EBUS (MISCELLANEOUS) IMPLANT
BAG COUNTER SPONGE SURGICOUNT (BAG) ×2 IMPLANT
BRUSH CYTOL CELLEBRITY 1.5X140 (MISCELLANEOUS) ×2 IMPLANT
BRUSH SUPERTRAX BIOPSY (INSTRUMENTS) IMPLANT
BRUSH SUPERTRAX NDL-TIP CYTO (INSTRUMENTS) ×2 IMPLANT
CANISTER SUCTION 3000ML PPV (SUCTIONS) ×2 IMPLANT
CNTNR URN SCR LID CUP LEK RST (MISCELLANEOUS) ×2 IMPLANT
COVER BACK TABLE 60X90IN (DRAPES) ×2 IMPLANT
FILTER STRAW FLUID ASPIR (MISCELLANEOUS) IMPLANT
FORCEPS BIOP 1.5 SINGLE USE (MISCELLANEOUS) ×2 IMPLANT
FORCEPS BIOP SUPERTRX PREMAR (INSTRUMENTS) ×2 IMPLANT
GAUZE SPONGE 4X4 12PLY STRL (GAUZE/BANDAGES/DRESSINGS) ×2 IMPLANT
GLOVE BIO SURGEON STRL SZ7.5 (GLOVE) ×4 IMPLANT
GOWN STRL REUS W/ TWL LRG LVL3 (GOWN DISPOSABLE) ×4 IMPLANT
KIT CLEAN ENDO COMPLIANCE (KITS) ×2 IMPLANT
KIT LOCATABLE GUIDE (CANNULA) IMPLANT
KIT MARKER FIDUCIAL DELIVERY (KITS) IMPLANT
KIT TURNOVER KIT B (KITS) ×2 IMPLANT
MARKER SKIN DUAL TIP RULER LAB (MISCELLANEOUS) ×2 IMPLANT
NDL SUPERTRX PREMARK BIOPSY (NEEDLE) ×2 IMPLANT
NEEDLE SUPERTRX PREMARK BIOPSY (NEEDLE) ×2 IMPLANT
OIL SILICONE PENTAX (PARTS (SERVICE/REPAIRS)) ×2 IMPLANT
PAD ARMBOARD POSITIONER FOAM (MISCELLANEOUS) ×4 IMPLANT
PATCHES PATIENT (LABEL) ×6 IMPLANT
SOLN 0.9% NACL 1000 ML (IV SOLUTION) ×2 IMPLANT
SOLN 0.9% NACL POUR BTL 1000ML (IV SOLUTION) ×2 IMPLANT
SOLN STERILE WATER 1000 ML (IV SOLUTION) ×2 IMPLANT
SOLN STERILE WATER BTL 1000 ML (IV SOLUTION) ×2 IMPLANT
SYR 20ML ECCENTRIC (SYRINGE) ×2 IMPLANT
SYR 20ML LL LF (SYRINGE) ×2 IMPLANT
SYR 50ML SLIP (SYRINGE) ×2 IMPLANT
SuperLock IMPLANT
TOWEL GREEN STERILE FF (TOWEL DISPOSABLE) ×2 IMPLANT
TRAP SPECIMEN MUCUS 40CC (MISCELLANEOUS) IMPLANT
TUBE CONNECTING 20X1/4 (TUBING) ×2 IMPLANT
UNDERPAD 30X36 HEAVY ABSORB (UNDERPADS AND DIAPERS) ×2 IMPLANT
VALVE BIOPSY SINGLE USE (MISCELLANEOUS) ×2 IMPLANT
VALVE SUCTION BRONCHIO DISP (MISCELLANEOUS) ×2 IMPLANT

## 2024-03-06 NOTE — Care Management CC44 (Signed)
 Condition Code 44 Documentation Completed  Patient Details  Name: Jesse [name] MRN: [id number] Date of Birth: 26-May-1955   Condition Code 44 given:  Yes Patient signature on Condition Code 44 notice:  Yes Documentation of 2 MD's agreement:  Yes Code 44 added to claim:  Yes    Rosalva Jon Bloch, RN 03/06/2024, 7:33 PM

## 2024-03-06 NOTE — Interval H&P Note (Signed)
 History and Physical Interval Note:  03/06/2024 7:19 AM  [name]  has presented today for surgery, with the diagnosis of lung nodule.  The various methods of treatment have been discussed with the patient and family. After consideration of risks, benefits and other options for treatment, the patient has consented to  Procedure(s) with comments: VIDEO BRONCHOSCOPY WITH ENDOBRONCHIAL NAVIGATION (Left) - Left lower lobe ENDOBRONCHIAL ULTRASOUND (EBUS) (Left) as a surgical intervention.  The patient's history has been reviewed, patient examined, no change in status, stable for surgery.  I have reviewed the patient's chart and labs.  His UA from 02/17/24 showed evidence for possible UTI but he is asymptomatic- he has a newly identified bactrim allergy (08/2023). Questions were answered to the patient's satisfaction.     Lamar GORMAN Chris

## 2024-03-06 NOTE — Progress Notes (Signed)
 Chaplain visited Pt to discuss advance directive however, Pt was sleeping.

## 2024-03-06 NOTE — Plan of Care (Signed)

## 2024-03-06 NOTE — Care Management (Signed)
 Attempt to do code 60 with patient. He was sound asleep, appreciated chest rise and fall but would not easily wake up. Discussed with bedside nurse who has appreciated him awake and asleep since arriving on unit, not concerning but unable to wake him up fully enough to sign for Code 44 at this time. Will revisit early tomorrow before he discharges

## 2024-03-06 NOTE — Progress Notes (Signed)
 Late entry 0618  Spoke to Dr. Leonce regarding patient's elevated B/P and urine collected on September 25th.  She recommended to continue monitoring B/P and notify Dr. Shelah of the urine sample.    9379  Dr. Shelah aware of urine lab work done on 02/17/24.

## 2024-03-06 NOTE — TOC CM/SW Note (Signed)
 Transition of Care [facility]) - Inpatient Brief Assessment   Patient Details  Name: Jesse [name] MRN: [id number] Date of Birth: [birth year]  Transition of Care [facility]) CM/SW Contact:    Lauraine FORBES Saa, LCSWA Phone Number: 03/06/2024, 3:52 PM   Clinical Narrative:  3:52 PM Per chart review, patient resides at home alone. Patient has a PCP and insurance. Patient does not have SNF history. Patient has HH history with [facility]. Patient has DME (BSC, RW) history with [facility]. Patient's preferred pharmacy's are [facility], [facility] [address] and [facility] [phone] [place]. CSW provided SDOH (social connections) resources. No TOC needs identified at this time. TOC will continue to follow.  Transition of Care Asessment: Insurance and Status: Insurance coverage has been reviewed Patient has primary care physician: Yes Home environment has been reviewed: Private Residence Prior level of function:: N/A Prior/Current Home Services: No current home services Social Drivers of Health Review: SDOH reviewed interventions complete Readmission risk has been reviewed: Yes (Currently Green 8%) Transition of care needs: no transition of care needs at this time

## 2024-03-06 NOTE — Discharge Instructions (Addendum)
 Flexible Bronchoscopy, Care After This sheet gives you information about how to care for yourself after your test. Your doctor may also give you more specific instructions. If you have problems or questions, contact your doctor. Follow these instructions at home: Eating and drinking When you are wide awake, your numbness is gone and your cough and gag reflexes have come back, you may: Start eating only soft foods. Slowly drink liquids. Six hours after the test, go back to your normal diet. Driving Do not drive for 24 hours if you were given a medicine to help you relax (sedative). Do not drive or use heavy machinery while taking prescription pain medicine. General instructions Take over-the-counter and prescription medicines only as told by your doctor. Return to your normal activities as told. Ask what activities are safe for you. Do not use any products that have nicotine  or tobacco in them. This includes cigarettes and e-cigarettes. If you need help quitting, ask your doctor. Keep all follow-up visits as told by your doctor. This is important. It is very important if you had a tissue sample (biopsy) taken. Get help right away if: You have shortness of breath that gets worse. You get light-headed. You feel like you are going to pass out (faint). You have chest pain. You cough up: More than a little blood. More blood than before. Summary Do not use cigarettes. Do not use e-cigarettes. Seek care in the Emergency Department right away if you have chest pain or shortness of breath. Call or MyChart Message our office for any questions or problems at [phone].  We will not restart Eliquis at this time Please take ciprofloxacin 500 mg once daily for 3 days.   This information is not intended to replace advice given to you by your health care provider. Make sure you discuss any questions you have with your health care provider.    Social Connections   [facility] (Adult Program)  -  Address: [address] [place], [place] [address] - General office #:  [phone] - Enrollment Phone #: [phone]  [facility]  - [facility] Line: call toll free, available 24 hours a day, at [phone]   [address]  [place] 2-1-1 is another useful way to locate resources in the community. Visit [email or web address].ch to find service information online. If you need additional assistance, 2-1-1 Referral Specialists are available 24 hours a day, every day by dialing 2-1-1 or [phone] from any phone. The call is free, confidential, and available in any language.  -[facility] [place]: [phone] / [address], [place], [place] [address]  -Dial 988: Talk lifeline 24/7.  -[place]  - [facility] Warmline: [phone]

## 2024-03-06 NOTE — Care Management Obs Status (Signed)
 MEDICARE OBSERVATION STATUS NOTIFICATION   Patient Details  Name: Jesse [name] MRN: [id number] Date of Birth: [birth year]   Medicare Observation Status Notification Given:  Yes    Rosalva Jon Bloch, RN 03/06/2024, 7:33 PM

## 2024-03-06 NOTE — Op Note (Signed)
 Video Bronchoscopy with Endobronchial Ultrasound and Electromagnetic Navigation Procedure Note  Date of Operation: 03/06/2024  Pre-op Diagnosis: Left lower lobe nodule, mediastinal adenopathy  Post-op Diagnosis: Same  Surgeon: LAMAR CHRIS  Assistants: None  Anesthesia: General endotracheal anesthesia  Operation: Flexible video fiberoptic bronchoscopy with endobronchial ultrasound, robotic assisted navigation and biopsies.  Estimated Blood Loss: Minimal  Complications: None apparent  Indications and History: Jesse [name] is a 68 y.o. male with history of tobacco use found to have a slowly enlarging left lower lobe pulmonary nodule on serial imaging.  PET scan showed possible mediastinal and left hilar adenopathy with hypermetabolism.  Recommendation was made to achieve a tissue diagnosis using endobronchial ultrasound and robotic assisted navigational bronchoscopy. The risks, benefits, complications, treatment options and expected outcomes were discussed with the patient.  The possibilities of pneumothorax, pneumonia, reaction to medication, pulmonary aspiration, perforation of a viscus, bleeding, failure to diagnose a condition and creating a complication requiring transfusion or operation were discussed with the patient who freely signed the consent.    Description of Procedure: The patient was seen in the Preoperative Area, was examined and was deemed appropriate to proceed.  The patient was taken to [facility] Endoscopy room 3, identified as Jesse [name] and the procedure verified as Flexible Video Fiberoptic Bronchoscopy with robotic assisted navigation and endobronchial ultrasound.  A Time Out was held and the above information confirmed.   Robotic assisted navigation: Prior to the date of the procedure a high-resolution CT scan of the chest was performed. Utilizing ION software program a virtual tracheobronchial tree was generated to allow the creation of distinct navigation pathways to  the patient's parenchymal abnormalities. After being taken to the operating room general anesthesia was initiated and the patient  was orally intubated. The video fiberoptic bronchoscope was introduced via the endotracheal tube and a general inspection was performed which showed normal right and left lung anatomy. Aspiration of the bilateral mainstems was completed to remove any remaining secretions. Robotic catheter inserted into patient's endotracheal tube.   Target #1 left lower lobe nodule: The distinct navigation pathways prepared prior to this procedure were then utilized to navigate to patient's lesion identified on CT scan. The robotic catheter was secured into place and the vision probe was withdrawn.  Lesion location was approximated using fluoroscopy.  Local registration and targeting was performed using [employer] Cios mobile C-arm three-dimensional imaging. Under fluoroscopic guidance transbronchial needle biopsies and transbronchial cryoprobe biopsies were performed to be sent for cytology and pathology.  Needle-in-lesion was confirmed using Cios mobile C-arm.  Under fluoroscopic guidance a single fiducial marker was placed adjacent to the nodule.   Endobronchial ultrasound: The robotic scope was then withdrawn and the endobronchial ultrasound was used to identify and characterize the peritracheal, hilar and bronchial lymph nodes. Inspection showed normal lymph nodes all less than 0.3 cm with the exception of station 7 which was approximately 0.7 cm. Using real-time ultrasound guidance Wang needle biopsies were take from Station 7 nodes and were sent for cytology.   At the end of the procedure a general airway inspection was performed and there was no evidence of active bleeding. The bronchoscope was removed.  The patient tolerated the procedure well. There was no significant blood loss and there were no obvious complications. A post-procedural chest x-ray is pending.  Samples  Target #1: 1. Transbronchial Wang needle biopsies from left lower lobe nodule 2. Transbronchial cryoprobe biopsies from left lower lobe nodule   EBUS Samples: 1. Wang needle biopsies from 7  node   Lamar Chris, MD, PhD 03/06/2024, 8:58 AM [facility]

## 2024-03-06 NOTE — Discharge Summary (Shared)
 Physician Discharge Summary  Patient ID: Jesse [name] MRN: [id number] DOB/AGE: 67-Nov-1957 67 y.o.  Admit date: 03/06/2024 Discharge date: 03/06/2024  Problem List Principal Problem:   Lung nodule seen on imaging study Active Problems:   Pulmonary nodule  HPI:  68 year old man with a history of active tobacco use, CAD, hypertension, prediabetes, remote pulmonary embolism in 2017 not on anticoagulation. He participates in lung cancer screening program. He has been experiencing unintentional weight loss of approximately 25 pounds over the last 4 to 5 years. [name] under evaluation for 2.5 cm left lower lobe pulmonary nodule that is hypermetabolic on PET scan. He presented for bronchoscopy on 03/06/2024 for biopsy. There had been some discussion about him getting back on Eliquis but he has not restarted this.  Of note he had a urinalysis 02/17/2024 that was suggestive of a possible urinary tract infection. [name] asymptomatic currently.   Hospital Course:  03/06/24 VBUS, Obvs admission  03/07/24 *** discharge home    Exam at discharge  *** Gen: Neuro: HEENT: CV: Pulm: GI: GU: MSK: Skin:    Plan at discharge:  LLL pulmonary nodule Tobacco use disorder HTN HLD Hx PE (2017) not on AC  Possible UTI P -follow up cytology from 03/06/2024 bronch  -follow up appointment scheduled with Lauraine Lites in the pulmonary office 03/13/24, as well as an appointment with Dr. Kerrin of [facility] 03/14/2024  -continue your home HTN HLD meds (norvasc , pravastatin , propranolol  ER)  -continue tobacco cessation efforts, home buproprion and PRN nicotine  patches  -*** complete ciprofloxacin    ***    Labs at discharge Lab Results  Component Value Date   CREATININE 1.14 02/17/2024   BUN 9 02/17/2024   NA 140 02/17/2024   K 4.2 02/17/2024   CL 107 02/17/2024   CO2 25 02/17/2024   Lab Results  Component Value Date   WBC 5.8 02/17/2024   HGB 14.4 02/17/2024   HCT 43.4 02/17/2024    MCV 90.0 02/17/2024   PLT 226 02/17/2024   Lab Results  Component Value Date   ALT 9 02/17/2024   AST 17 02/17/2024   ALKPHOS 55 09/14/2023   BILITOT 0.5 02/17/2024   No results found for: INR, PROTIME  Current radiology studies DG Chest Port 1 View Result Date: 03/06/2024 CLINICAL DATA:  Status post bronchoscopy. EXAM: PORTABLE CHEST 1 VIEW COMPARISON:  06/12/2016 and CT chest 02/03/2024. FINDINGS: Trachea is midline. Heart size stable. Lungs are somewhat low in volume with minimal bibasilar atelectasis. Fiducial marker in the left lower lobe. No pneumothorax. No pleural fluid. IMPRESSION: 1. No pneumothorax status post bronchoscopy. 2. Known left lower lobe nodule, better seen on CT chest 02/03/2024. Electronically Signed   By: Newell Eke M.D.   On: 03/06/2024 10:11   DG C-ARM BRONCHOSCOPY Result Date: 03/06/2024 C-ARM BRONCHOSCOPY: Fluoroscopy was utilized by the requesting physician.  No radiographic interpretation.    Disposition:     Allergies as of 03/06/2024       Reactions   Bactrim [sulfamethoxazole-trimethoprim] Other (See Comments)   Acute kidney injury   Penicillins Hives     Med Rec must be completed prior to using this SMARTLINK***         Discharged Condition: stable  Time spent on discharge greater than 30 minutes.  Vital signs at Discharge. Temp:  [97.7 F (36.5 C)-97.8 F (36.6 C)] 97.7 F (36.5 C) (10/13 1121) Pulse Rate:  [50-65] 52 (10/13 1121) Resp:  [15-25] 16 (10/13 1121) BP: (129-175)/(87-108) 169/100 (10/13 1121)  SpO2:  [95 %-100 %] 100 % (10/13 1121) Weight:  [88.9 kg] 88.9 kg (10/13 0601)   ***

## 2024-03-06 NOTE — H&P (Signed)
 Jesse [name] is an 68 y.o. male.   Chief Complaint: Pulmonary Nodules HPI:  68 year old man with a history of active tobacco use, CAD, hypertension, prediabetes, remote pulmonary embolism in 2017 not on anticoagulation.  He participates in lung cancer screening program.  He has been experiencing unintentional weight loss of approximately 25 pounds over the last 4 to 5 years.  [name] under evaluation for 2.5 cm left lower lobe pulmonary nodule that is hypermetabolic on PET scan.  He presented for bronchoscopy on 03/06/2024 for biopsy.  There had been some discussion about him getting back on Eliquis but he has not restarted this.  Of note he had a urinalysis 02/17/2024 that was suggestive of a possible urinary tract infection.  [name] asymptomatic currently.  Past Medical History:  Diagnosis Date   Coronary artery disease    Depression 02/15/2014   Hypertension    Myocardial infarction [facility])    NSTEMI   Pre-diabetes    Pulmonary embolism (HCC)    Sleep disturbance 02/15/2014   Tremors of nervous system    Trochanteric bursitis of left hip 08/30/2018    Past Surgical History:  Procedure Laterality Date   HIP SURGERY Bilateral    TOTAL HIP ARTHROPLASTY Bilateral     Family History  Problem Relation Age of Onset   Diabetes Mother    Diabetes Father    Coronary artery disease Father 53       stent   Healthy Sister    Healthy Sister    Healthy Sister    Lung disease Brother    Healthy Brother    Healthy Brother    Healthy Daughter    Healthy Son    Breast cancer Neg Hx    Social History:  reports that he has been smoking cigarettes. He started smoking about 36 years ago. He has a 23.9 pack-year smoking history. He has never used smokeless tobacco. He reports that he does not drink alcohol and does not use drugs.  Allergies:  Allergies  Allergen Reactions   Bactrim [Sulfamethoxazole-Trimethoprim] Other (See Comments)    Acute kidney injury   Penicillins Hives    Medications  Prior to Admission  Medication Sig Dispense Refill   amLODipine  (NORVASC ) 5 MG tablet Take 1 tablet (5 mg total) by mouth daily. 90 tablet 3   buPROPion  (WELLBUTRIN  SR) 150 MG 12 hr tablet Take 1 tablet (150 mg total) by mouth 2 (two) times daily. 90 tablet 1   nicotine  (NICODERM CQ  - DOSED IN MG/24 HOURS) 21 mg/24hr patch Place 1 patch (21 mg total) onto the skin daily. 90 patch 3   nitroGLYCERIN  (NITROSTAT ) 0.4 MG SL tablet Place 1 tablet (0.4 mg total) under the tongue every 5 (five) minutes as needed for chest pain. 25 tablet 3   pravastatin  (PRAVACHOL ) 40 MG tablet Take 1 tablet (40 mg total) by mouth daily. 90 tablet 1   propranolol  ER (INDERAL  LA) 60 MG 24 hr capsule Take 1 capsule (60 mg total) by mouth daily. For tremor 30 capsule 1   apixaban (ELIQUIS) 2.5 MG TABS tablet Take 1 tablet (2.5 mg total) by mouth 2 (two) times daily. (Patient not taking: Reported on 03/06/2024) 180 tablet 1   aspirin  81 MG chewable tablet Chew 81 mg by mouth daily. (Patient not taking: Reported on 03/06/2024)      No results found for this or any previous visit (from the past 48 hours). DG Chest Port 1 View Result Date: 03/06/2024 CLINICAL DATA:  Status  post bronchoscopy. EXAM: PORTABLE CHEST 1 VIEW COMPARISON:  06/12/2016 and CT chest 02/03/2024. FINDINGS: Trachea is midline. Heart size stable. Lungs are somewhat low in volume with minimal bibasilar atelectasis. Fiducial marker in the left lower lobe. No pneumothorax. No pleural fluid. IMPRESSION: 1. No pneumothorax status post bronchoscopy. 2. Known left lower lobe nodule, better seen on CT chest 02/03/2024. Electronically Signed   By: Newell Eke M.D.   On: 03/06/2024 10:11   DG C-ARM BRONCHOSCOPY Result Date: 03/06/2024 C-ARM BRONCHOSCOPY: Fluoroscopy was utilized by the requesting physician.  No radiographic interpretation.    Review of Systems As per HPI Blood pressure (!) 169/100, pulse (!) 52, temperature 97.7 F (36.5 C), temperature source  Oral, resp. rate 16, height 5' 11 (1.803 m), weight 88.9 kg, SpO2 100%. Physical Exam  Gen: Pleasant, well-nourished, in no distress,  normal affect  ENT: No lesions,  mouth clear,  oropharynx clear, no postnasal drip  Neck: No JVD, no stridor  Lungs: No use of accessory muscles, no crackles or wheezing on normal respiration, no wheeze on forced expiration  Cardiovascular: RRR, heart sounds normal, no murmur or gallops, no peripheral edema  Abdomen: soft and NT, no HSM,  BS normal  Musculoskeletal: No deformities, no cyanosis or clubbing  Neuro: alert, awake, non focal  Skin: Warm, no lesions or rashes    Assessment/Plan Left lower lobe pulmonary nodule and active smoker concerning for primary lung cancer. - Status post navigational bronchoscopy on 03/06/2024.  Await bronchoscopy cytology results. - [name] being admitted for observation postprocedure with plans to discharge home on 03/07/2024  Hypertension Hyperlipidemia - Restart his home amlodipine  and propranolol  - Continue his home pravastatin   Remote history of small pulmonary embolism in 2017 - He has been off anticoagulation.  Some discussion about restarting his Eliquis but I think for now we can defer that.  Could consider obtaining a VQ scan at some point in the future if we are concerned about chronic thromboembolic disease.  Tobacco use - On home bupropion  and occasionally uses nicotine  patches.  Lamar GORMAN Chris, MD 03/06/2024, 3:25 PM

## 2024-03-06 NOTE — Transfer of Care (Signed)
 Immediate Anesthesia Transfer of Care Note  Patient: Jesse [name]  Procedure(s) Performed: VIDEO BRONCHOSCOPY WITH ENDOBRONCHIAL NAVIGATION (Left) ENDOBRONCHIAL ULTRASOUND (EBUS) (Left) BRONCHOSCOPY, WITH NEEDLE ASPIRATION BIOPSY CRYOTHERAPY INSERTION, FIDUCIAL MARKER, GOLD  Patient Location: PACU  Anesthesia Type:General  Level of Consciousness: awake and drowsy  Airway & Oxygen Therapy: Patient Spontanous Breathing and Patient connected to face mask oxygen  Post-op Assessment: Report given to RN and Post -op Vital signs reviewed and stable  Post vital signs: Reviewed and stable  Last Vitals:  Vitals Value Taken Time  BP 129/96 03/06/24 09:30  Temp 36.6 C 03/06/24 09:20  Pulse 56 03/06/24 09:36  Resp 15 03/06/24 09:36  SpO2 97 % 03/06/24 09:36  Vitals shown include unfiled device data.  Last Pain:  Vitals:   03/06/24 0920  PainSc: Asleep         Complications: No notable events documented.

## 2024-03-06 NOTE — Op Note (Signed)
 Procedure Note  Patient: Jesse [name]  S[employer] Cios mobile C-arm was utilized to identify and biopsy left lower lobe pulmonary nodule.  Needle-in-lesion was confirmed using real-time Cios imaging, and images were uploaded to PACS.     Lamar Chris, MD, PhD 03/06/2024, 9:18 AM [facility] [phone] or if no answer before 7:00PM call [phone] For any issues after 7:00PM please call eLink [phone]

## 2024-03-06 NOTE — Anesthesia Procedure Notes (Signed)
 Procedure Name: Intubation Date/Time: 03/06/2024 7:45 AM  Performed by: Mollie Olivia SAUNDERS, CRNAPre-anesthesia Checklist: Patient identified, Emergency Drugs available, Suction available and Patient being monitored Patient Re-evaluated:Patient Re-evaluated prior to induction Oxygen Delivery Method: Circle system utilized Preoxygenation: Pre-oxygenation with 100% oxygen Induction Type: IV induction Ventilation: Mask ventilation without difficulty Laryngoscope Size: Glidescope and 4 Tube type: Oral Tube size: 8.5 mm Number of attempts: 1 Airway Equipment and Method: Oral airway, Rigid stylet and Video-laryngoscopy Placement Confirmation: ETT inserted through vocal cords under direct vision, positive ETCO2 and breath sounds checked- equal and bilateral Secured at: 23 cm Tube secured with: Tape Dental Injury: Teeth and Oropharynx as per pre-operative assessment

## 2024-03-07 ENCOUNTER — Encounter (HOSPITAL_COMMUNITY): Payer: Self-pay | Admitting: Emergency Medicine

## 2024-03-07 DIAGNOSIS — C7A090 Malignant carcinoid tumor of the bronchus and lung: Secondary | ICD-10-CM | POA: Diagnosis not present

## 2024-03-07 NOTE — Plan of Care (Signed)

## 2024-03-07 NOTE — Anesthesia Postprocedure Evaluation (Signed)
 Anesthesia Post Note  Patient: Jesse [name]  Procedure(s) Performed: VIDEO BRONCHOSCOPY WITH ENDOBRONCHIAL NAVIGATION (Left) ENDOBRONCHIAL ULTRASOUND (EBUS) (Left) BRONCHOSCOPY, WITH NEEDLE ASPIRATION BIOPSY CRYOTHERAPY INSERTION, FIDUCIAL MARKER, GOLD     Patient location during evaluation: PACU Anesthesia Type: General Level of consciousness: awake and alert Pain management: pain level controlled Vital Signs Assessment: post-procedure vital signs reviewed and stable Respiratory status: spontaneous breathing, nonlabored ventilation, respiratory function stable and patient connected to nasal cannula oxygen Cardiovascular status: blood pressure returned to baseline and stable Postop Assessment: no apparent nausea or vomiting Anesthetic complications: no   No notable events documented.  Last Vitals:  Vitals:   03/07/24 0411 03/07/24 0745  BP: 107/71 125/80  Pulse: (!) 51 (!) 56  Resp: 16 18  Temp: 36.7 C 36.6 C  SpO2: 98% 100%    Last Pain:  Vitals:   03/07/24 0745  TempSrc: Oral  PainSc:                  Cordella SQUIBB Fahmida Jurich

## 2024-03-08 ENCOUNTER — Other Ambulatory Visit: Payer: Self-pay

## 2024-03-08 LAB — CYTOLOGY - NON PAP

## 2024-03-08 NOTE — Patient Instructions (Signed)
 Visit Information  Thank you for taking time to visit with me today. Please don't hesitate to contact me if I can be of assistance to you before our next scheduled appointment.  Our next appointment is by telephone on 03/29/24 at 1pm Please call the care guide team at [phone] if you need to cancel or reschedule your appointment.   Following is a copy of your care plan:   Goals Addressed             This Visit's Progress    BSW VBCI Social Work Care Plan       Problems:   Corporate treasurer  and Food Insecurity   CSW Clinical Goal(s):   Over the next 30 days the Patient will work with Child psychotherapist to address concerns related to food.  Interventions:  SW emailed resources for food and finances  Patient Goals/Self-Care Activities:  Patient will use resources SW sent for food assistance  Plan:   The care management team will reach out to the patient again over the next 15 days.        Please call the Suicide and Crisis Lifeline: 988 call the [place]  National Suicide Prevention Lifeline: 9520778219 or TTY: 559-744-4288 TTY (867)172-5425) to talk to a trained counselor call 1-800-273-TALK (toll free, 24 hour hotline) call 911 if you are experiencing a Mental Health or Behavioral Health Crisis or need someone to talk to.  Patient verbalizes understanding of instructions and care plan provided today and agrees to view in MyChart. Active MyChart status and patient understanding of how to access instructions and care plan via MyChart confirmed with patient.     Thersia Hoar, HEDWIG, MHA Ensign  [facility] Social Worker, Population Health [phone]

## 2024-03-08 NOTE — Patient Outreach (Signed)
 Complex Care Management   Visit Note  03/08/2024  Name:  Jesse [name] MRN: [id number] DOB: 1956-02-24  Situation: Referral received for Complex Care Management related to SDOH Barriers:  Food insecurity Financial Resource Strain I obtained verbal consent from Patient.  Visit completed with Patient  on the phone  Background:   Past Medical History:  Diagnosis Date   Coronary artery disease    Depression 02/15/2014   Hypertension    Myocardial infarction [facility])    NSTEMI   Pre-diabetes    Pulmonary embolism (HCC)    Sleep disturbance 02/15/2014   Tremors of nervous system    Trochanteric bursitis of left hip 08/30/2018    Assessment: SW completed a telephone outreach with patient, he states he is needing assistance with food. Patient does receive foodstamps and is not responsible for household bills but has a car payment that take most of his money. Patient states he only receives $21 in foodstamps each month. [name] aware of food pantries and [employer]. SW and patient agreed for resources to be emailed to address on file.  SDOH Interventions    Flowsheet Row Patient Outreach Telephone from 03/08/2024 in [facility] POPULATION HEALTH DEPARTMENT Admission (Discharged) from 03/06/2024 in [facility] GENERAL MED/SURG UNIT Office Visit from 03/03/2024 in [facility] - A Dept Of [place]  SDOH Interventions     Food Insecurity Interventions Community Resources Provided -- Intervention Not Indicated  Housing Interventions -- -- Intervention Not Indicated  Transportation Interventions -- -- Intervention Not Indicated  Utilities Interventions -- -- Intervention Not Indicated  Financial Strain Interventions Community Resources Provided -- Intervention Not Indicated  Stress Interventions -- -- Intervention Not Indicated  Social Connections Interventions -- Community Resources Provided, Inpatient TOC --  Health Literacy Interventions -- --  Intervention Not Indicated    Recommendation:   No recommendations at this time  Follow Up Plan:   Telephone follow-up 03/29/24 at 1pm  Thersia Hoar, BSW, MHA [place]  [facility] Social Worker, Population Health [phone]

## 2024-03-13 ENCOUNTER — Ambulatory Visit

## 2024-03-13 ENCOUNTER — Encounter: Payer: Self-pay | Admitting: Acute Care

## 2024-03-13 ENCOUNTER — Ambulatory Visit: Admitting: Acute Care

## 2024-03-13 VITALS — BP 167/114 | HR 50 | Temp 97.7°F | Ht 71.0 in | Wt 198.0 lb

## 2024-03-13 DIAGNOSIS — Z9889 Other specified postprocedural states: Secondary | ICD-10-CM

## 2024-03-13 DIAGNOSIS — C3432 Malignant neoplasm of lower lobe, left bronchus or lung: Secondary | ICD-10-CM | POA: Diagnosis not present

## 2024-03-13 DIAGNOSIS — R911 Solitary pulmonary nodule: Secondary | ICD-10-CM

## 2024-03-13 DIAGNOSIS — R9389 Abnormal findings on diagnostic imaging of other specified body structures: Secondary | ICD-10-CM | POA: Diagnosis not present

## 2024-03-13 DIAGNOSIS — R942 Abnormal results of pulmonary function studies: Secondary | ICD-10-CM

## 2024-03-13 DIAGNOSIS — C7A090 Malignant carcinoid tumor of the bronchus and lung: Secondary | ICD-10-CM | POA: Diagnosis not present

## 2024-03-13 DIAGNOSIS — Z720 Tobacco use: Secondary | ICD-10-CM

## 2024-03-13 NOTE — Patient Instructions (Signed)
 Full pft performed today

## 2024-03-13 NOTE — Patient Instructions (Addendum)
 It is good to see you today. I am glad you have done well since the procedure.  We have reviewed your biopsy results. The left lower lobe nodule was positive for a malignant neuroendocrine tumor.  The 7 lymph node was negative for malignancy. I have referred you to both surgery and medical oncology.  You will get calls to get these appointments scheduled.  I have also ordered Pulmonary function testing to ensure you qualify for surgery. You will get a call to get this scheduled.  Please work on quitting smoking completely. This is very important.  Patients have better response to treatment if they are not smoking.  Follow up with ENT as scheduled for later today to better evaluate the nonspecific mucosal enhancement at the midline posterior nasopharynx at site of the hypermetabolic activity described on PET-CT. This may impact how we move froward with treatment.  Call if you need us  for anything. Good luck with treatment. You can receive free nicotine  replacement therapy (patches, gum, or mints) by calling 1-800-QUIT NOW. Please call so we can get you on the path to becoming a non-smoker. I know it is hard, but you can do this!  Hypnosis for smoking cessation  [employer] [phone]  Acupuncture for smoking cessation  [employer] [phone]  Please contact office for sooner follow up if symptoms do not improve or worsen or seek emergency care

## 2024-03-13 NOTE — Progress Notes (Signed)
 History of Present Illness Jesse [name] is a 68 y.o. male current every day smoker followed through the lung cancer screening program. Here today for nodule consult as follow up to an abnormal lung cancer screening scan 01/2024. He will be followed by Dr. Shelah.    03/13/2024 Discussed the use of AI scribe software for clinical note transcription with the patient, who gave verbal consent to proceed.  History of Present Illness Pt. Presents for follow up after bronchoscopy with biopsies of an left lower lobe pulmonary nodule. He states he has done well since the biopsy. No bleeding, fever, discolored secretions or worsening shortness of breath, no issues with anesthesia.   We have reviewed the results of his biopsy. The biopsy of the left lower lobe was positive for a well differentiated neuroendocrine tumor, favoring a G2 atypical carcinoid. The station 7 lymph node was negative for malignancy.   He has a history of smoking but has significantly reduced his consumption, having smoked only three cigarettes from a pack purchased on Friday and half of one before the visit. He attributes his reduced urge to smoke to the Chantix he has been taking.  He has not had pulmonary function tests since 1985, despite a history of smoking. He recalls having spirometry testing done frequently when he worked in [employer]. We will get him scheduled for PFT's ASAP , as he will be referred to both thoracic surgery and Medical oncology.  He has a CT  scan of his head and neck as follow up to some hypermetabolic activity noted on PET that indicated something in the back of his throat, He has follow up with an ear, nose, and throat specialist this afternoon to have this further evaluated..    Test Results: FINAL MICROSCOPIC DIAGNOSIS:  B. LYMPH NODE, STATION 7, FINE NEEDLE ASPIRATION:  - Negative for malignancy  - Consistent with a benign lymph node   SPECIMEN ADEQUACY:  B. Satisfactory for Evaluation    LEFT LUNG, LOWER LOBE, NODULE, FINE NEEDLE ASPIRATION: - Malignant - Well-differentiated neuroendocrine tumor, favor G2 (atypical carcinoid)  COMMENT:  The ThinPrep and aspirate smears are highly cellular and show numerous loosely cohesive sheets and nests of atypical cells with enlarged round to oval nuclei with salt-and-pepper chromatin and a moderate amount of eosinophilic cytoplasm.  Scattered apoptotic bodies and rare single cell necrosis is seen.  No mitoses are identified.  The cellblock shows similar features although with much less cellularity.  Four immunohistochemical stains were performed with adequate control.  The neoplastic cells within the cellblock are positive for the neuroendocrine markers synaptophysin.  They are negative for the pulmonary adeno markers TTF-1 and Napsin A.  The proliferation marker Ki-67 appears to decorate greater than 5% of the tumor.  This proliferation index, the cytomorphology and scattered apoptotic bodies would tend to favor an atypical carcinoid.  Case is reviewed by Dr. Belvie who concurs with the interpretation.     Latest Ref Rng & Units 02/17/2024   11:46 AM 09/14/2023    5:54 PM 04/23/2015   10:09 AM  CBC  WBC 3.8 - 10.8 Thousand/uL 5.8  10.1  6.4   Hemoglobin 13.2 - 17.1 g/dL 85.5  84.8  83.9   Hematocrit 38.5 - 50.0 % 43.4  44.4  46.4   Platelets 140 - 400 Thousand/uL 226  355  226        Latest Ref Rng & Units 02/17/2024   11:46 AM 10/27/2023    1:31 PM 09/15/2023  5:57 AM  BMP  Glucose 65 - 99 mg/dL 97  872  895   BUN 7 - 25 mg/dL 9  7  19    Creatinine 0.70 - 1.35 mg/dL 8.85  8.95  8.46   BUN/Creat Ratio 6 - 22 (calc) SEE NOTE:     Sodium 135 - 146 mmol/L 140  140  137   Potassium 3.5 - 5.3 mmol/L 4.2  4.4  4.1   Chloride 98 - 110 mmol/L 107  107  114   CO2 20 - 32 mmol/L 25  25  19    Calcium 8.6 - 10.3 mg/dL 9.2  9.3  8.1     BNP No results found for: BNP  ProBNP No results found for: PROBNP  PFT No  results found for: FEV1PRE, FEV1POST, FVCPRE, FVCPOST, TLC, DLCOUNC, PREFEV1FVCRT, PSTFEV1FVCRT  DG Chest Port 1 View Result Date: 03/06/2024 CLINICAL DATA:  Status post bronchoscopy. EXAM: PORTABLE CHEST 1 VIEW COMPARISON:  06/12/2016 and CT chest 02/03/2024. FINDINGS: Trachea is midline. Heart size stable. Lungs are somewhat low in volume with minimal bibasilar atelectasis. Fiducial marker in the left lower lobe. No pneumothorax. No pleural fluid. IMPRESSION: 1. No pneumothorax status post bronchoscopy. 2. Known left lower lobe nodule, better seen on CT chest 02/03/2024. Electronically Signed   By: Newell Eke M.D.   On: 03/06/2024 10:11   DG C-ARM BRONCHOSCOPY Result Date: 03/06/2024 C-ARM BRONCHOSCOPY: Fluoroscopy was utilized by the requesting physician.  No radiographic interpretation.   CT SOFT TISSUE NECK W CONTRAST Result Date: 02/23/2024 CLINICAL DATA:  Provided history: Pulmonary nodule, left. Pharyngeal dysphagia. Head/neck cancer, staging. Additional history provided: Positive area on PET scan with history of dysphagia. EXAM: CT NECK WITH CONTRAST TECHNIQUE: Multidetector CT imaging of the neck was performed using the standard protocol following the bolus administration of intravenous contrast. RADIATION DOSE REDUCTION: This exam was performed according to the departmental dose-optimization program which includes automated exposure control, adjustment of the mA and/or kV according to patient size and/or use of iterative reconstruction technique. CONTRAST:  75mL OMNIPAQUE IOHEXOL 300 MG/ML  SOLN COMPARISON:  Head CT 02/21/2024. FINDINGS: Pharynx and larynx: Nonspecific mucosal enhancement at the midline posterior nasopharynx at site of the hypermetabolic activity described on yesterday's PET-CT (for instance as seen on series 2, images 22-25). No appreciable swelling or mass elsewhere within the oral cavity pharynx or larynx. Small calcific foci within the palatine tonsils  bilaterally, which could reflect postinflammatory calcifications and/or tonsilloliths. Salivary glands: Unremarkable. Thyroid: Left thyroid lobe nodules measuring up to 9 mm, not meeting consensus criteria for ultrasound follow-up based on size. No follow-up imaging recommended. Reference: J Am Coll Radiol. 2015 Feb;12(2): 143-50. Lymph nodes: No pathologically enlarged lymph node identified. Vascular: The major vascular structures of the neck are patent. Nonstenotic calcified plaque at the left vertebral artery origin and about the left carotid bifurcation. Atherosclerotic plaque within the intracranial internal carotid arteries. Limited intracranial: No evidence of an acute intracranial abnormality within the field of view. Visualized orbits: No orbital mass or acute orbital finding. Mastoids and visualized paranasal sinuses: Portions of the frontal sinuses are excluded from the field of view superiorly. No significant paranasal sinus disease or mastoid effusion at the imaged levels. Skeleton: Spondylosis at the cervical and visible thoracic levels. No acute fracture or aggressive osseous lesion. Upper chest: Please refer to the PET CT performed yesterday for description of intrathoracic findings. Impression #1 will be called to the ordering clinician or representative by the Radiologist Assistant, and communication documented  in the PACS or [employer]. IMPRESSION: 1. Nonspecific mucosal enhancement at the midline posterior nasopharynx at site of the hypermetabolic activity described on yesterday's PET-CT. ENT referral and direct visualization are recommended to exclude a mucosal neoplasm. 2. No pathologically enlarged lymph nodes within the neck. 3. Postinflammatory calcifications and/or tonsilloliths within the palatine tonsils. Electronically Signed   By: Rockey Childs D.O.   On: 02/23/2024 16:46   NM PET Image Initial (PI) Skull Base To Thigh Result Date: 02/22/2024 CLINICAL DATA:  Initial treatment  strategy for pulmonary nodule. EXAM: NUCLEAR MEDICINE PET SKULL BASE TO THIGH TECHNIQUE: 10.8 mCi F-18 FDG was injected intravenously. Full-ring PET imaging was performed from the skull base to thigh after the radiotracer. CT data was obtained and used for attenuation correction and anatomic localization. Fasting blood glucose: 97 mg/dl COMPARISON:  Chest CT 90/88/7974 FINDINGS: Mediastinal blood pool activity: SUV max 10.8 Liver activity: SUV max 97 NECK: Intense focus of hypermetabolism identified along the midline mucosa of the posterior nasopharynx. No underlying mass lesion evident by noncontrast CT imaging. No hypermetabolic lymphadenopathy in the neck. Incidental CT findings: None. CHEST: Left lower lobe pulmonary nodule measured 25.4 mm on recent lung cancer screening chest CT is intensely hypermetabolic with SUV max = 32.1. Areas of subtle low level nodular FDG uptake identified in the hilar regions bilaterally without underlying discrete hilar lymphadenopathy. In retrospect, there is a tiny 7 mm nodule in the right suprahilar upper lobe nestled in a bronchovascular bundle (see image 95/3 of the previous lung cancer screening exam). This cannot be separated from the adjacent vascularity but does generates some mass-effect on a bronchus immediately posterior to the finding. There is some low level FDG uptake in this region but is considered nonspecific as multiple additional similar tiny foci of low level FDG uptake are identified in the parahilar regions bilaterally. No intensely hypermetabolic mediastinal or hilar lymph nodes evident. Incidental CT findings: None. ABDOMEN/PELVIS: No abnormal hypermetabolic activity within the liver, pancreas, adrenal glands, or spleen. No hypermetabolic lymph nodes in the abdomen or pelvis. Incidental CT findings: Renal and hepatic cysts evident. Abdominal aorta measures up to 2.9 cm diameter with mild atherosclerotic calcific plaque evident. Prostate gland is enlarged.  SKELETON: No focal hypermetabolic activity to suggest skeletal metastasis. Incidental CT findings: None. IMPRESSION: 1. 25.4 mm left lower lobe pulmonary nodule identified on previous lung cancer screening study is intensely hypermetabolic, consistent with primary bronchogenic neoplasm. 2. Subtle low level nodular FDG uptake identified in the hilar regions bilaterally without underlying discrete lymphadenopathy. In retrospect, a tiny 7 mm nodule is identified in the right suprahilar upper lobe nestled in a bronchovascular bundle. This cannot be separated from the adjacent vascularity but does generates some mass-effect on a bronchus immediately posterior to the finding. There is some low level FDG uptake in this region but is considered nonspecific as multiple additional similar tiny foci of low level FDG uptake are identified in the parahilar regions bilaterally. Close attention on follow-up recommended. 3. Intense focus of hypermetabolism identified along the midline mucosa of the posterior nasopharynx. No underlying mass lesion evident by noncontrast CT imaging. Direct visualization recommended to exclude mucosal lesion. 4. No evidence for hypermetabolic metastatic disease in the abdomen or pelvis. 5.  Aortic Atherosclerosis (ICD10-I70.0). Electronically Signed   By: Camellia Candle M.D.   On: 02/22/2024 05:54     Past medical hx Past Medical History:  Diagnosis Date   Coronary artery disease    Depression 02/15/2014   Hypertension  Myocardial infarction [facility])    NSTEMI   Pre-diabetes    Pulmonary embolism (HCC)    Sleep disturbance 02/15/2014   Tremors of nervous system    Trochanteric bursitis of left hip 08/30/2018     Social History   Tobacco Use   Smoking status: Every Day    Current packs/day: 0.65    Average packs/day: 0.7 packs/day for 36.8 years (23.9 ttl pk-yrs)    Types: Cigarettes    Start date: 1989   Smokeless tobacco: Never   Tobacco comments:    trying to quit  smoking,1 cigarettes a day KRD 03/13/2024-1 pack lasts about a week   Vaping Use   Vaping status: Never Used  Substance Use Topics   Alcohol use: No   Drug use: No    Jesse [name] reports that he has been smoking cigarettes. He started smoking about 36 years ago. He has a 23.9 pack-year smoking history. He has never used smokeless tobacco. He reports that he does not drink alcohol and does not use drugs.  Tobacco Cessation: Ready to quit: Not Answered Counseling given: Not Answered Tobacco comments: trying to quit smoking,1 cigarettes a day KRD 03/13/2024-1 pack lasts about a week  Current every day smoker, counseled to quit smoking x 3 minutes at today's visit.  Past surgical hx, Family hx, Social hx all reviewed.  Current Outpatient Medications on File Prior to Visit  Medication Sig   amLODipine  (NORVASC ) 5 MG tablet Take 1 tablet (5 mg total) by mouth daily.   aspirin  81 MG chewable tablet Chew 81 mg by mouth daily.   buPROPion  (WELLBUTRIN  SR) 150 MG 12 hr tablet Take 1 tablet (150 mg total) by mouth 2 (two) times daily.   nicotine  (NICODERM CQ  - DOSED IN MG/24 HOURS) 21 mg/24hr patch Place 1 patch (21 mg total) onto the skin daily.   nitroGLYCERIN  (NITROSTAT ) 0.4 MG SL tablet Place 1 tablet (0.4 mg total) under the tongue every 5 (five) minutes as needed for chest pain.   pravastatin  (PRAVACHOL ) 40 MG tablet Take 1 tablet (40 mg total) by mouth daily.   propranolol  ER (INDERAL  LA) 60 MG 24 hr capsule Take 1 capsule (60 mg total) by mouth daily. For tremor   No current facility-administered medications on file prior to visit.     Allergies  Allergen Reactions   Bactrim [Sulfamethoxazole-Trimethoprim] Other (See Comments)    Acute kidney injury   Penicillins Hives    Review Of Systems:  Constitutional:   No  weight loss, night sweats,  Fevers, chills, fatigue, or  lassitude.  HEENT:   No headaches,  Difficulty swallowing,  Tooth/dental problems, or  Sore throat,                 No sneezing, itching, ear ache, nasal congestion, post nasal drip,   CV:  No chest pain,  Orthopnea, PND, swelling in lower extremities, anasarca, dizziness, palpitations, syncope.   GI  No heartburn, indigestion, abdominal pain, nausea, vomiting, diarrhea, change in bowel habits, loss of appetite, bloody stools.   Resp: + shortness of breath with exertion , none at rest.  No excess mucus, no productive cough,  No non-productive cough,  No coughing up of blood.  No change in color of mucus.  No wheezing.  No chest wall deformity  Skin: no rash or lesions.  GU: no dysuria, change in color of urine, no urgency or frequency.  No flank pain, no hematuria   MS:  No joint pain or swelling.  No decreased range of motion.  + back pain.  Psych:  No change in mood or affect. No depression or anxiety.  No memory loss.His response to his diagnosis is appropriate. [name] asking good questions    Vital Signs Ht 5' 11 (1.803 m)   Wt 198 lb (89.8 kg)   BMI 27.62 kg/m    Physical Exam:  General- No distress,  A&Ox3, appropriate ENT: No sinus tenderness, TM clear, pale nasal mucosa, no oral exudate,no post nasal drip, no LAN Cardiac: S1, S2, regular rate and rhythm, no murmur Chest: No wheeze/ rales/ dullness; no accessory muscle use, no nasal flaring, no sternal retractions, slightly diminished per bases.  Abd.: Soft Non-tender, ND, BS +, Body mass index is 27.62 kg/m.  Ext: No clubbing cyanosis, no edema, no obvious deformities Neuro:  normal strength. 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  - Order pulmonary function testing to assess surgical candidacy. - Refer to thoracic surgery for evaluation and potential resection. - Refer to medical oncology for  evaluation and potential targeted therapy/ chemo if surgery not feasible. - Consider MRI of brain if requested by surgical team. - PFT's to determine surgical candidacy.   Tobacco use disorder Actively reducing smoking. Smoking cessation crucial for surgical outcomes and treatment efficacy. Using nicotine  patches and medication. - Encourage continued smoking cessation. - Provide support and resources for smoking cessation, including free nicotine  patches.   AVS 03/13/2024 I am glad you have done well since the procedure.  We have reviewed your biopsy results. The left lower lobe nodule was positive for a malignant neuroendocrine tumor.  The 7 lymph node was negative for malignancy. I have referred you to both surgery and medical oncology.  You will get calls to get these appointments scheduled.  I have also ordered Pulmonary function testing to ensure you qualify for surgery. You will get a call to get this scheduled.  Please work on quitting smoking completely. This is very important.  Patients have better response to treatment if they are not smoking.  Follow up with ENT as scheduled for later today to better evaluate the nonspecific mucosal enhancement at the midline posterior nasopharynx at site of the hypermetabolic activity described on PET-CT. This may impact how we move froward with treatment.  Call if you need us  for anything. Good luck with treatment. You can receive free nicotine  replacement therapy (patches, gum, or mints) by calling 1-800-QUIT NOW. Please call so we can get you on the path to becoming a non-smoker. I know it is hard, but you can do this!  Hypnosis for smoking cessation  [employer] [phone]  Acupuncture for smoking cessation  [employer] [phone]  Please contact office for sooner follow up if symptoms do not improve or worsen or seek emergency care    I spent 40 minutes dedicated to the care of this patient on the date  of this encounter to include pre-visit review of records, face-to-face time with the patient discussing conditions above, post visit ordering of testing, clinical documentation with the electronic health record, making appropriate referrals as documented, and communicating necessary information to the patient's healthcare team.      Lauraine JULIANNA Lites, NP 03/13/2024  9:44 AM

## 2024-03-13 NOTE — Progress Notes (Signed)
 Full pft performed today

## 2024-03-14 ENCOUNTER — Ambulatory Visit
Attending: Thoracic Surgery (Cardiothoracic Vascular Surgery) | Admitting: Thoracic Surgery (Cardiothoracic Vascular Surgery)

## 2024-03-14 ENCOUNTER — Encounter: Payer: Self-pay | Admitting: Thoracic Surgery (Cardiothoracic Vascular Surgery)

## 2024-03-14 ENCOUNTER — Encounter: Payer: Self-pay | Admitting: *Deleted

## 2024-03-14 ENCOUNTER — Other Ambulatory Visit: Payer: Self-pay | Admitting: *Deleted

## 2024-03-14 VITALS — BP 138/89 | HR 60 | Resp 20 | Ht 71.0 in | Wt 197.0 lb

## 2024-03-14 DIAGNOSIS — R911 Solitary pulmonary nodule: Secondary | ICD-10-CM

## 2024-03-14 DIAGNOSIS — C7A090 Malignant carcinoid tumor of the bronchus and lung: Secondary | ICD-10-CM

## 2024-03-14 NOTE — H&P (View-Only) (Signed)
 PCP is Jesse [name], Jesse A, MD Referring Provider is Everlene Parris LABOR, MD  Chief Complaint  Patient presents with   Lung Lesion    Surgical consult/ Chest CT 02/03/24/ PET Scan 02/21/24/ Bronch 03/06/24/ PFT's 03/13/24    HPI: 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  Jesse [name] is [name] 68 year old male with [name] history of tobacco abuse, CAD, MI, PE, hypertension, prediabetes, tremors, gynecomastia and depression.  He smoked about three fourths of [name] pack of cigarettes daily for 40 years.  Currently trying to quit and down to about 1 to 3 cigarettes/day.  [name] taking Chantix and using [name] nicotine  patch.  He had been feeling poorly for quite some time and he lost significant amount of weight over [name] couple years.  He established with Dr. Zafirov.  Because of his smoking history he had [name] low-dose CT for lung cancer screening.  It showed [name] 2.5 cm central left lower lobe mass.  PET/CT showed the mass was hypermetabolic.  He underwent navigational bronchoscopy by Dr. Shelah which showed atypical carcinoid tumor.  25 pound weight loss over past 4 years.  Has gained 4 pounds in the past 3 months.  No anginal type chest pain, pressure, or tightness.  Does have chest pain associated with cough.  Complains of difficulty swallowing.  Can occur anytime during the day but most frequent in the morning.  Noted to have soft tissue thickening and an area of hypermetabolism in his nasopharynx on PET.  Has an appointment with ENT tomorrow.  Zubrod Score: At the time of surgery this patient's most appropriate activity status/level should be described as: [x]     0    Normal activity, no symptoms []     1    Restricted in physical strenuous activity but ambulatory, able to do out light work []     2    Ambulatory and capable of self care, unable to do work activities, up and about >50 % of waking hours                              []     3    Only limited self care, in bed  greater than 50% of waking hours []     4    Completely disabled, no self care, confined to bed or chair []     5    Moribund  Past Medical History:  Diagnosis Date   Coronary artery disease    Depression 02/15/2014   Hypertension    Myocardial infarction [facility])    NSTEMI   Pre-diabetes    Pulmonary embolism (HCC)    Sleep disturbance 02/15/2014   Tremors of nervous system    Trochanteric bursitis of left hip 08/30/2018    Past Surgical History:  Procedure Laterality Date   BRONCHIAL NEEDLE ASPIRATION BIOPSY  03/06/2024   Procedure: BRONCHOSCOPY, WITH NEEDLE ASPIRATION BIOPSY;  Surgeon: Shelah Lamar RAMAN, MD;  Location: [facility] ENDOSCOPY;  Service: Pulmonary;;   CRYOTHERAPY  03/06/2024   Procedure: CRYOTHERAPY;  Surgeon: Shelah Lamar RAMAN, MD;  Location: [facility] ENDOSCOPY;  Service: Pulmonary;;   ENDOBRONCHIAL ULTRASOUND Left 03/06/2024   Procedure: ENDOBRONCHIAL ULTRASOUND (EBUS);  Surgeon: Shelah Lamar RAMAN, MD;  Location: [facility] ENDOSCOPY;  Service: Pulmonary;  Laterality: Left;   FUDUCIAL PLACEMENT  03/06/2024   Procedure: INSERTION, FIDUCIAL MARKER, GOLD;  Surgeon: Shelah Lamar RAMAN, MD;  Location: [facility] ENDOSCOPY;  Service: Pulmonary;;  HIP SURGERY Bilateral    TOTAL HIP ARTHROPLASTY Bilateral    VIDEO BRONCHOSCOPY WITH ENDOBRONCHIAL NAVIGATION Left 03/06/2024   Procedure: VIDEO BRONCHOSCOPY WITH ENDOBRONCHIAL NAVIGATION;  Surgeon: Shelah Lamar RAMAN, MD;  Location: [facility] ENDOSCOPY;  Service: Pulmonary;  Laterality: Left;  Left lower lobe    Family History  Problem Relation Age of Onset   Diabetes Mother    Diabetes Father    Coronary artery disease Father 31       stent   Healthy Sister    Healthy Sister    Healthy Sister    Lung disease Brother    Healthy Brother    Healthy Brother    Healthy Daughter    Healthy Son    Breast cancer Neg Hx     Social History Social History   Tobacco Use   Smoking status: Every Day    Current packs/day: 0.65    Average packs/day: 0.7 packs/day for 36.8  years (23.9 ttl pk-yrs)    Types: Cigarettes    Start date: 1989   Smokeless tobacco: Never   Tobacco comments:    trying to quit smoking,1 cigarettes [name] day KRD 03/13/2024-1 pack lasts about [name] week   Vaping Use   Vaping status: Never Used  Substance Use Topics   Alcohol use: No   Drug use: No    Current Outpatient Medications  Medication Sig Dispense Refill   amLODipine  (NORVASC ) 5 MG tablet Take 1 tablet (5 mg total) by mouth daily. 90 tablet 3   aspirin  81 MG chewable tablet Chew 81 mg by mouth daily.     buPROPion  (WELLBUTRIN  SR) 150 MG 12 hr tablet Take 1 tablet (150 mg total) by mouth 2 (two) times daily. 90 tablet 1   nicotine  (NICODERM CQ  - DOSED IN MG/24 HOURS) 21 mg/24hr patch Place 1 patch (21 mg total) onto the skin daily. 90 patch 3   nitroGLYCERIN  (NITROSTAT ) 0.4 MG SL tablet Place 1 tablet (0.4 mg total) under the tongue every 5 (five) minutes as needed for chest pain. 25 tablet 3   pravastatin  (PRAVACHOL ) 40 MG tablet Take 1 tablet (40 mg total) by mouth daily. 90 tablet 1   propranolol  ER (INDERAL  LA) 60 MG 24 hr capsule Take 1 capsule (60 mg total) by mouth daily. For tremor 30 capsule 1   No current facility-administered medications for this visit.    Allergies  Allergen Reactions   Bactrim [Sulfamethoxazole-Trimethoprim] Other (See Comments)    Acute kidney injury   Penicillins Hives    Review of Systems  Constitutional:  Positive for appetite change and unexpected weight change (See HPI). Negative for activity change and fatigue.  HENT:  Positive for trouble swallowing. Negative for voice change.   Respiratory:  Positive for cough. Negative for shortness of breath.   Cardiovascular:  Positive for chest pain (Atypical). Negative for palpitations and leg swelling.  Gastrointestinal:  Negative for abdominal distention and abdominal pain.  Genitourinary:  Negative for difficulty urinating.  Musculoskeletal:  Negative for arthralgias and myalgias.   Neurological:  Negative for seizures and headaches.  Hematological:  Negative for adenopathy. Does not bruise/bleed easily.  Psychiatric/Behavioral:  Positive for dysphoric mood.   All other systems reviewed and are negative.   BP 138/89   Pulse 60   Resp 20   Ht 5' 11 (1.803 m)   Wt 197 lb (89.4 kg)   SpO2 99% Comment: RA  BMI 27.48 kg/m  Physical Exam Vitals reviewed.  Constitutional:  General: [name] not in acute distress.    Appearance: Normal appearance.  HENT:     Head: Normocephalic and atraumatic.     Ears:     Comments: Hearing aids bilaterally Eyes:     General: No scleral icterus.    Extraocular Movements: Extraocular movements intact.  Cardiovascular:     Rate and Rhythm: Normal rate and regular rhythm.     Heart sounds: Normal heart sounds. No murmur heard.    No friction rub. No gallop.  Pulmonary:     Effort: Pulmonary effort is normal. No respiratory distress.     Breath sounds: Normal breath sounds. No wheezing or rales.  Abdominal:     General: There is no distension.     Palpations: Abdomen is soft.  Lymphadenopathy:     Cervical: No cervical adenopathy.  Skin:    General: Skin is warm and dry.  Neurological:     General: No focal deficit present.     Mental Status: [name] alert and oriented to person, place, and time.     Cranial Nerves: No cranial nerve deficit.     Motor: No weakness.    Diagnostic Tests: NUCLEAR MEDICINE PET SKULL BASE TO THIGH   TECHNIQUE: 10.8 mCi F-18 FDG was injected intravenously. Full-ring PET imaging was performed from the skull base to thigh after the radiotracer. CT data was obtained and used for attenuation correction and anatomic localization.   Fasting blood glucose: 97 mg/dl   COMPARISON:  Chest CT 02/03/2024   FINDINGS: Mediastinal blood pool activity: SUV max 10.8   Liver activity: SUV max 97   NECK: Intense focus of hypermetabolism identified along the midline mucosa of the posterior  nasopharynx. No underlying mass lesion evident by noncontrast CT imaging. No hypermetabolic lymphadenopathy in the neck.   Incidental CT findings: None.   CHEST: Left lower lobe pulmonary nodule measured 25.4 mm on recent lung cancer screening chest CT is intensely hypermetabolic with SUV max = 32.1.   Areas of subtle low level nodular FDG uptake identified in the hilar regions bilaterally without underlying discrete hilar lymphadenopathy. In retrospect, there is [name] tiny 7 mm nodule in the right suprahilar upper lobe nestled in [name] bronchovascular bundle (see image 95/3 of the previous lung cancer screening exam). This cannot be separated from the adjacent vascularity but does generates some mass-effect on [name] bronchus immediately posterior to the finding. There is some low level FDG uptake in this region but is considered nonspecific as multiple additional similar tiny foci of low level FDG uptake are identified in the parahilar regions bilaterally.   No intensely hypermetabolic mediastinal or hilar lymph nodes evident.   Incidental CT findings: None.   ABDOMEN/PELVIS: No abnormal hypermetabolic activity within the liver, pancreas, adrenal glands, or spleen. No hypermetabolic lymph nodes in the abdomen or pelvis.   Incidental CT findings: Renal and hepatic cysts evident. Abdominal aorta measures up to 2.9 cm diameter with mild atherosclerotic calcific plaque evident. Prostate gland is enlarged.   SKELETON: No focal hypermetabolic activity to suggest skeletal metastasis.   Incidental CT findings: None.   IMPRESSION: 1. 25.4 mm left lower lobe pulmonary nodule identified on previous lung cancer screening study is intensely hypermetabolic, consistent with primary bronchogenic neoplasm. 2. Subtle low level nodular FDG uptake identified in the hilar regions bilaterally without underlying discrete lymphadenopathy. In retrospect, [name] tiny 7 mm nodule is identified in the right  suprahilar upper lobe nestled in [name] bronchovascular bundle. This cannot be separated from  the adjacent vascularity but does generates some mass-effect on [name] bronchus immediately posterior to the finding. There is some low level FDG uptake in this region but is considered nonspecific as multiple additional similar tiny foci of low level FDG uptake are identified in the parahilar regions bilaterally. Close attention on follow-up recommended. 3. Intense focus of hypermetabolism identified along the midline mucosa of the posterior nasopharynx. No underlying mass lesion evident by noncontrast CT imaging. Direct visualization recommended to exclude mucosal lesion. 4. No evidence for hypermetabolic metastatic disease in the abdomen or pelvis. 5.  Aortic Atherosclerosis (ICD10-I70.0).     Electronically Signed   By: Camellia Candle M.D.   On: 02/22/2024 05:54 I personally reviewed the CT and PET/CT images.  2.5 cm central left lower lobe nodule that is hypermetabolic on PET.  No mediastinal or hilar adenopathy.  Hypermetabolism in posterior nasopharynx.  Aortic and coronary atherosclerosis.  Pulmonary function testing FVC 3.86 (82%) FEV1 2.74 (78%) FEV1 3.14 (89%) postbronchodilator DLCO 21.01 (77%)   Impression: Jesse [name] is [name] 68 year old male with [name] history of tobacco abuse, CAD, MI, PE, hypertension, prediabetes, tremors, gynecomastia and depression.  Found to have [name] left lower lobe mass on low-dose screening CT.  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  No evidence of hilar or mediastinal adenopathy.  Clinical stage is 1A (T1, N0).  I described the postoperative procedure to Mr. [employer].  The plan would be to do [name] robotic assisted left lower lobectomy.  I informed him of the general nature of the procedure including the need for general anesthesia, the incisions to be used, use of the surgical robot, use  of [name] drainage tube postoperatively, the expected hospital stay, and the overall recovery.  We did discuss possibility of the need for conversion to an open procedure.  I informed him of the indications, risks, benefits, and alternatives.  He understands the risks include, but not limited to death, MI, DVT, PE, bleeding, possible need for transfusion, infection, prolonged air leak, cardiac arrhythmias, as well as the possibility of other procedural complications.  He does have [name] history of [name] pulmonary embolus almost 10 years ago.  Apparently was unprovoked.  Consider [name] course of anticoagulation postoperatively.  History of CAD-no symptoms currently suggestive of angina.  Hypermetabolic activity and mucosal thickening in posterior nasopharynx-to be evaluated by ENT.  He has an appointment tomorrow.  Plan: Meet with ENT tomorrow as scheduled Robotic assisted left lower lobectomy on Friday, 03/31/2024  [name], MD [facility] [phone]

## 2024-03-14 NOTE — Progress Notes (Unsigned)
 NN reached out to the pt to let him know the cancer center has received his referral and it was reviewed with our lung specialist, Dr Sherrod. Per Dr Blas recommendation, the pt can be scheduled with us  after his CT surgery. Pt verbalized understanding.

## 2024-03-14 NOTE — Progress Notes (Signed)
 PCP is Zafirov, Clarissa A, MD Referring Provider is Everlene Parris LABOR, MD  Chief Complaint  Patient presents with   Lung Lesion    Surgical consult/ Chest CT 02/03/24/ PET Scan 02/21/24/ Bronch 03/06/24/ PFT's 03/13/24    HPI: 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  Cardell Appenzeller is a 68 year old male with a history of tobacco abuse, CAD, MI, PE, hypertension, prediabetes, tremors, gynecomastia and depression.  He smoked about three fourths of a pack of cigarettes daily for 40 years.  Currently trying to quit and down to about 1 to 3 cigarettes/day.  [name] taking Chantix and using a nicotine  patch.  He had been feeling poorly for quite some time and he lost significant amount of weight over a couple years.  He established with Dr. Zafirov.  Because of his smoking history he had a low-dose CT for lung cancer screening.  It showed a 2.5 cm central left lower lobe mass.  PET/CT showed the mass was hypermetabolic.  He underwent navigational bronchoscopy by Dr. Shelah which showed atypical carcinoid tumor.  25 pound weight loss over past 4 years.  Has gained 4 pounds in the past 3 months.  No anginal type chest pain, pressure, or tightness.  Does have chest pain associated with cough.  Complains of difficulty swallowing.  Can occur anytime during the day but most frequent in the morning.  Noted to have soft tissue thickening and an area of hypermetabolism in his nasopharynx on PET.  Has an appointment with ENT tomorrow.  Zubrod Score: At the time of surgery this patient's most appropriate activity status/level should be described as: [x]     0    Normal activity, no symptoms []     1    Restricted in physical strenuous activity but ambulatory, able to do out light work []     2    Ambulatory and capable of self care, unable to do work activities, up and about >50 % of waking hours                              []     3    Only limited self care, in bed  greater than 50% of waking hours []     4    Completely disabled, no self care, confined to bed or chair []     5    Moribund  Past Medical History:  Diagnosis Date   Coronary artery disease    Depression 02/15/2014   Hypertension    Myocardial infarction [facility])    NSTEMI   Pre-diabetes    Pulmonary embolism (HCC)    Sleep disturbance 02/15/2014   Tremors of nervous system    Trochanteric bursitis of left hip 08/30/2018    Past Surgical History:  Procedure Laterality Date   BRONCHIAL NEEDLE ASPIRATION BIOPSY  03/06/2024   Procedure: BRONCHOSCOPY, WITH NEEDLE ASPIRATION BIOPSY;  Surgeon: Shelah Lamar RAMAN, MD;  Location: [facility] ENDOSCOPY;  Service: Pulmonary;;   CRYOTHERAPY  03/06/2024   Procedure: CRYOTHERAPY;  Surgeon: Shelah Lamar RAMAN, MD;  Location: [facility] ENDOSCOPY;  Service: Pulmonary;;   ENDOBRONCHIAL ULTRASOUND Left 03/06/2024   Procedure: ENDOBRONCHIAL ULTRASOUND (EBUS);  Surgeon: Shelah Lamar RAMAN, MD;  Location: [facility] ENDOSCOPY;  Service: Pulmonary;  Laterality: Left;   FUDUCIAL PLACEMENT  03/06/2024   Procedure: INSERTION, FIDUCIAL MARKER, GOLD;  Surgeon: Shelah Lamar RAMAN, MD;  Location: [facility] ENDOSCOPY;  Service: Pulmonary;;  HIP SURGERY Bilateral    TOTAL HIP ARTHROPLASTY Bilateral    VIDEO BRONCHOSCOPY WITH ENDOBRONCHIAL NAVIGATION Left 03/06/2024   Procedure: VIDEO BRONCHOSCOPY WITH ENDOBRONCHIAL NAVIGATION;  Surgeon: Shelah Lamar RAMAN, MD;  Location: [facility] ENDOSCOPY;  Service: Pulmonary;  Laterality: Left;  Left lower lobe    Family History  Problem Relation Age of Onset   Diabetes Mother    Diabetes Father    Coronary artery disease Father 31       stent   Healthy Sister    Healthy Sister    Healthy Sister    Lung disease Brother    Healthy Brother    Healthy Brother    Healthy Daughter    Healthy Son    Breast cancer Neg Hx     Social History Social History   Tobacco Use   Smoking status: Every Day    Current packs/day: 0.65    Average packs/day: 0.7 packs/day for 36.8  years (23.9 ttl pk-yrs)    Types: Cigarettes    Start date: 1989   Smokeless tobacco: Never   Tobacco comments:    trying to quit smoking,1 cigarettes a day KRD 03/13/2024-1 pack lasts about a week   Vaping Use   Vaping status: Never Used  Substance Use Topics   Alcohol use: No   Drug use: No    Current Outpatient Medications  Medication Sig Dispense Refill   amLODipine  (NORVASC ) 5 MG tablet Take 1 tablet (5 mg total) by mouth daily. 90 tablet 3   aspirin  81 MG chewable tablet Chew 81 mg by mouth daily.     buPROPion  (WELLBUTRIN  SR) 150 MG 12 hr tablet Take 1 tablet (150 mg total) by mouth 2 (two) times daily. 90 tablet 1   nicotine  (NICODERM CQ  - DOSED IN MG/24 HOURS) 21 mg/24hr patch Place 1 patch (21 mg total) onto the skin daily. 90 patch 3   nitroGLYCERIN  (NITROSTAT ) 0.4 MG SL tablet Place 1 tablet (0.4 mg total) under the tongue every 5 (five) minutes as needed for chest pain. 25 tablet 3   pravastatin  (PRAVACHOL ) 40 MG tablet Take 1 tablet (40 mg total) by mouth daily. 90 tablet 1   propranolol  ER (INDERAL  LA) 60 MG 24 hr capsule Take 1 capsule (60 mg total) by mouth daily. For tremor 30 capsule 1   No current facility-administered medications for this visit.    Allergies  Allergen Reactions   Bactrim [Sulfamethoxazole-Trimethoprim] Other (See Comments)    Acute kidney injury   Penicillins Hives    Review of Systems  Constitutional:  Positive for appetite change and unexpected weight change (See HPI). Negative for activity change and fatigue.  HENT:  Positive for trouble swallowing. Negative for voice change.   Respiratory:  Positive for cough. Negative for shortness of breath.   Cardiovascular:  Positive for chest pain (Atypical). Negative for palpitations and leg swelling.  Gastrointestinal:  Negative for abdominal distention and abdominal pain.  Genitourinary:  Negative for difficulty urinating.  Musculoskeletal:  Negative for arthralgias and myalgias.   Neurological:  Negative for seizures and headaches.  Hematological:  Negative for adenopathy. Does not bruise/bleed easily.  Psychiatric/Behavioral:  Positive for dysphoric mood.   All other systems reviewed and are negative.   BP 138/89   Pulse 60   Resp 20   Ht 5' 11 (1.803 m)   Wt 197 lb (89.4 kg)   SpO2 99% Comment: RA  BMI 27.48 kg/m  Physical Exam Vitals reviewed.  Constitutional:  General: [name] not in acute distress.    Appearance: Normal appearance.  HENT:     Head: Normocephalic and atraumatic.     Ears:     Comments: Hearing aids bilaterally Eyes:     General: No scleral icterus.    Extraocular Movements: Extraocular movements intact.  Cardiovascular:     Rate and Rhythm: Normal rate and regular rhythm.     Heart sounds: Normal heart sounds. No murmur heard.    No friction rub. No gallop.  Pulmonary:     Effort: Pulmonary effort is normal. No respiratory distress.     Breath sounds: Normal breath sounds. No wheezing or rales.  Abdominal:     General: There is no distension.     Palpations: Abdomen is soft.  Lymphadenopathy:     Cervical: No cervical adenopathy.  Skin:    General: Skin is warm and dry.  Neurological:     General: No focal deficit present.     Mental Status: [name] alert and oriented to person, place, and time.     Cranial Nerves: No cranial nerve deficit.     Motor: No weakness.    Diagnostic Tests: NUCLEAR MEDICINE PET SKULL BASE TO THIGH   TECHNIQUE: 10.8 mCi F-18 FDG was injected intravenously. Full-ring PET imaging was performed from the skull base to thigh after the radiotracer. CT data was obtained and used for attenuation correction and anatomic localization.   Fasting blood glucose: 97 mg/dl   COMPARISON:  Chest CT 02/03/2024   FINDINGS: Mediastinal blood pool activity: SUV max 10.8   Liver activity: SUV max 97   NECK: Intense focus of hypermetabolism identified along the midline mucosa of the posterior  nasopharynx. No underlying mass lesion evident by noncontrast CT imaging. No hypermetabolic lymphadenopathy in the neck.   Incidental CT findings: None.   CHEST: Left lower lobe pulmonary nodule measured 25.4 mm on recent lung cancer screening chest CT is intensely hypermetabolic with SUV max = 32.1.   Areas of subtle low level nodular FDG uptake identified in the hilar regions bilaterally without underlying discrete hilar lymphadenopathy. In retrospect, there is a tiny 7 mm nodule in the right suprahilar upper lobe nestled in a bronchovascular bundle (see image 95/3 of the previous lung cancer screening exam). This cannot be separated from the adjacent vascularity but does generates some mass-effect on a bronchus immediately posterior to the finding. There is some low level FDG uptake in this region but is considered nonspecific as multiple additional similar tiny foci of low level FDG uptake are identified in the parahilar regions bilaterally.   No intensely hypermetabolic mediastinal or hilar lymph nodes evident.   Incidental CT findings: None.   ABDOMEN/PELVIS: No abnormal hypermetabolic activity within the liver, pancreas, adrenal glands, or spleen. No hypermetabolic lymph nodes in the abdomen or pelvis.   Incidental CT findings: Renal and hepatic cysts evident. Abdominal aorta measures up to 2.9 cm diameter with mild atherosclerotic calcific plaque evident. Prostate gland is enlarged.   SKELETON: No focal hypermetabolic activity to suggest skeletal metastasis.   Incidental CT findings: None.   IMPRESSION: 1. 25.4 mm left lower lobe pulmonary nodule identified on previous lung cancer screening study is intensely hypermetabolic, consistent with primary bronchogenic neoplasm. 2. Subtle low level nodular FDG uptake identified in the hilar regions bilaterally without underlying discrete lymphadenopathy. In retrospect, a tiny 7 mm nodule is identified in the right  suprahilar upper lobe nestled in a bronchovascular bundle. This cannot be separated from  the adjacent vascularity but does generates some mass-effect on a bronchus immediately posterior to the finding. There is some low level FDG uptake in this region but is considered nonspecific as multiple additional similar tiny foci of low level FDG uptake are identified in the parahilar regions bilaterally. Close attention on follow-up recommended. 3. Intense focus of hypermetabolism identified along the midline mucosa of the posterior nasopharynx. No underlying mass lesion evident by noncontrast CT imaging. Direct visualization recommended to exclude mucosal lesion. 4. No evidence for hypermetabolic metastatic disease in the abdomen or pelvis. 5.  Aortic Atherosclerosis (ICD10-I70.0).     Electronically Signed   By: Camellia Candle M.D.   On: 02/22/2024 05:54 I personally reviewed the CT and PET/CT images.  2.5 cm central left lower lobe nodule that is hypermetabolic on PET.  No mediastinal or hilar adenopathy.  Hypermetabolism in posterior nasopharynx.  Aortic and coronary atherosclerosis.  Pulmonary function testing FVC 3.86 (82%) FEV1 2.74 (78%) FEV1 3.14 (89%) postbronchodilator DLCO 21.01 (77%)   Impression: Stelios Kirby is a 68 year old male with a history of tobacco abuse, CAD, MI, PE, hypertension, prediabetes, tremors, gynecomastia and depression.  Found to have a left lower lobe mass on low-dose screening CT.  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  No evidence of hilar or mediastinal adenopathy.  Clinical stage is 1A (T1, N0).  I described the postoperative procedure to Mr. [employer].  The plan would be to do a robotic assisted left lower lobectomy.  I informed him of the general nature of the procedure including the need for general anesthesia, the incisions to be used, use of the surgical robot, use  of a drainage tube postoperatively, the expected hospital stay, and the overall recovery.  We did discuss possibility of the need for conversion to an open procedure.  I informed him of the indications, risks, benefits, and alternatives.  He understands the risks include, but not limited to death, MI, DVT, PE, bleeding, possible need for transfusion, infection, prolonged air leak, cardiac arrhythmias, as well as the possibility of other procedural complications.  He does have a history of a pulmonary embolus almost 10 years ago.  Apparently was unprovoked.  Consider a course of anticoagulation postoperatively.  History of CAD-no symptoms currently suggestive of angina.  Hypermetabolic activity and mucosal thickening in posterior nasopharynx-to be evaluated by ENT.  He has an appointment tomorrow.  Plan: Meet with ENT tomorrow as scheduled Robotic assisted left lower lobectomy on Friday, 03/31/2024  [name], MD [facility] [phone]

## 2024-03-15 ENCOUNTER — Other Ambulatory Visit: Payer: Self-pay

## 2024-03-15 ENCOUNTER — Ambulatory Visit: Attending: Medical | Admitting: Medical

## 2024-03-15 ENCOUNTER — Ambulatory Visit

## 2024-03-15 ENCOUNTER — Encounter: Payer: Self-pay | Admitting: Medical

## 2024-03-15 VITALS — BP 158/100 | HR 61 | Wt 199.0 lb

## 2024-03-15 DIAGNOSIS — Z720 Tobacco use: Secondary | ICD-10-CM

## 2024-03-15 DIAGNOSIS — I10 Essential (primary) hypertension: Secondary | ICD-10-CM | POA: Diagnosis not present

## 2024-03-15 DIAGNOSIS — Z86711 Personal history of pulmonary embolism: Secondary | ICD-10-CM

## 2024-03-15 DIAGNOSIS — I25118 Atherosclerotic heart disease of native coronary artery with other forms of angina pectoris: Secondary | ICD-10-CM | POA: Diagnosis not present

## 2024-03-15 DIAGNOSIS — E782 Mixed hyperlipidemia: Secondary | ICD-10-CM

## 2024-03-15 DIAGNOSIS — Z01810 Encounter for preprocedural cardiovascular examination: Secondary | ICD-10-CM

## 2024-03-15 MED ORDER — ROSUVASTATIN CALCIUM 40 MG PO TABS: 40.0000 mg | ORAL_TABLET | Freq: Every day | ORAL | 3 refills | Status: DC

## 2024-03-15 MED ORDER — AMLODIPINE BESYLATE 10 MG PO TABS: 10.0000 mg | ORAL_TABLET | Freq: Every day | ORAL | 3 refills | Status: DC

## 2024-03-15 NOTE — Progress Notes (Unsigned)
 Cardiology Office Note   Date:  03/15/2024  ID:  Taran Haynesworth Eloise, M[place] 1955-07-19, MRN [id number] PCP: Everlene Parris LABOR, MD  [facility] Providers Cardiologist:  None  History of Present Illness Jesse [name] is a 68 y.o. male with a history of CAD with CTO of the left circumflex, pulmonary embolism (appears to be unprovoked though details unclear), hypertension, hyperlipidemia, prediabetes, tobacco use who is being seen for follow-up of chest pain.  Initial diagnosis of CAD was made in August/2023 when he was admitted with left-sided back and shoulder pain at [facility].  Cardiac catheterization at that time showed CTO of the left circumflex with elevated troponin felt to be due to supply/demand mismatch.  Patient was seen September 2025 as a new patient.  He reported sporadic vague chest discomfort and chronic exertional dyspnea.  The patient is needing a left lower lobectomy and is scheduled for 03/31/24 due to malignant nodule.He denies chest pain, SOB, lower leg edema, lightheadedness, dizziness, palpitations, heart racing. Says he may have an extra beat per BP monitor at home. He doesn't feel his heart skipping a beat. He does no formal activity. He can walk 1-2 blocks. He can walk up a flight of stairs and is able to to yard work.   He reports mild TIA a long time ago. [name] not on insulin for diabetes. He smokes, 1 pack lasts a week.   Studies Reviewed EKG Interpretation Date/Time:  Wednesday March 15 2024 14:59:07 EDT Ventricular Rate:  61 PR Interval:  170 QRS Duration:  90 QT Interval:  430 QTC Calculation: 432 R Axis:   10  Text Interpretation: Normal sinus rhythm Possible Left atrial enlargement When compared with ECG of 02-Feb-2024 15:27, No significant change was found Confirmed by Franchester, Tayvin Preslar ([id number]) on 03/15/2024 3:02:22 PM     HYPERTENSION CONTROL Vitals:   03/15/24 1453 03/15/24 1455  BP: (!) 153/102 (!) 158/100    The patient's blood pressure  is elevated above target today. {Click here if intervention needs to be changed Refresh Note :1}  In order to address the patient's elevated BP:         Physical Exam VS:  BP (!) 158/100 (BP Location: Right Arm, Patient Position: Sitting, Cuff Size: Normal)   Pulse 61   Wt 199 lb (90.3 kg)   SpO2 99%   BMI 27.75 kg/m        Wt Readings from Last 3 Encounters:  03/15/24 199 lb (90.3 kg)  03/14/24 197 lb (89.4 kg)  03/13/24 198 lb (89.8 kg)    GEN: Well nourished, well developed in no acute distress NECK: No JVD; No carotid bruits CARDIAC: RRR, no murmurs, rubs, gallops RESPIRATORY:  Clear to auscultation without rales, wheezing or rhonchi  ABDOMEN: Soft, non-tender, non-distended EXTREMITIES:  No edema; No deformity   ASSESSMENT AND PLAN  CAD Patient denies any chest pain or shortness of breath.  No further ischemic workup at this time.  Continue aspirin  81 mg daily.  Amlodipine  5 mg daily, propranolol  60 mg daily.  I am changing pravastatin  to Crestor.  HTN Blood pressure today is elevated.  I will increase amlodipine  to 10 mg daily.  Continue propranolol  60 mg daily.  May require additional blood pressure medication.  Also discussed lifestyle changes.  HLD LDL was 80.  I will stop pravastatin  and start Crestor 40 mg daily.  Repeat fasting lipid and LFTs in 2 months.  H/o PE Circumstances are unclear, but sounds like it was unprovoked.  [name] not on a/c at this time. Management per PCP.   Tobacco use Patient is still smoking, but trying to quit.  Preoperative cardiac evaluation Patient is needing lower lung lobectomy for malignant tumor, scheduled for 03/31/2024.  Patient is overall stable from a cardiac perspective.  We are optimizing cholesterol and blood pressure at this time.  No further cardiac workup required prior to surgery.  Patient's overall function is good with METS greater than 4.  Okay to stop aspirin  perioperatively as it does not seem he has had any prior  stenting. RCRI= 10% risk of MACE    Dispo: Follow-up in 3 to 4 months  Signed, Haydan Wedig VEAR Fishman, PA-C

## 2024-03-15 NOTE — Patient Instructions (Addendum)
 Medication Instructions:  Your physician recommends the following medication changes.  STOP TAKING: Prevastatin  START TAKING: Crestor 40 mg daily  INCREASE: Amlodipine  to 10 mg daily   *If you need a refill on your cardiac medications before your next appointment, please call your pharmacy*  Lab Work: Your provider would like for you to return in 2 months to have the following labs drawn: Lipid, LFT.   Please go to [facility] [address] ([facility] [address], [place] [address] You do not need an appointment.  They are open from 8 am- 4:30 pm.  Lunch from 1:00 pm- 2:00 pm You DO need to be fasting.   If you have labs (blood work) drawn today and your tests are completely normal, you will receive your results only by: MyChart Message (if you have MyChart) OR A paper copy in the mail If you have any lab test that is abnormal or we need to change your treatment, we will call you to review the results.  Testing/Procedures: No test ordered today   Follow-Up: At [facility], you and your health needs are our priority.  As part of our continuing mission to provide you with exceptional heart care, our providers are all part of one team.  This team includes your primary Cardiologist (physician) and Advanced Practice Providers or APPs (Physician Assistants and Nurse Practitioners) who all work together to provide you with the care you need, when you need it.  Your next appointment:   3 - 4  month(s)  Provider:   You may see one of the following Advanced Practice Providers on your designated Care Team:   Lonni Meager, NP Lesley Maffucci, PA-C Bernardino Bring, PA-C Cadence [place], PA-C Tylene Lunch, NP Barnie Hila, NP

## 2024-03-16 ENCOUNTER — Encounter: Payer: Self-pay | Admitting: *Deleted

## 2024-03-16 ENCOUNTER — Other Ambulatory Visit: Payer: Self-pay

## 2024-03-16 DIAGNOSIS — D3A090 Benign carcinoid tumor of the bronchus and lung: Secondary | ICD-10-CM

## 2024-03-16 LAB — PULMONARY FUNCTION TEST
DL/VA % pred: 86 %
DL/VA: 3.54 ml/min/mmHg/L
DLCO cor % pred: 77 %
DLCO cor: 21.01 ml/min/mmHg
DLCO unc % pred: 76 %
DLCO unc: 20.89 ml/min/mmHg
FEF 25-75 Post: 3.22 L/s
FEF 25-75 Pre: 1.67 L/s
FEF2575-%Change-Post: 93 %
FEF2575-%Pred-Post: 118 %
FEF2575-%Pred-Pre: 61 %
FEV1-%Change-Post: 14 %
FEV1-%Pred-Post: 89 %
FEV1-%Pred-Pre: 78 %
FEV1-Post: 3.14 L
FEV1-Pre: 2.74 L
FEV1FVC-%Change-Post: 4 %
FEV1FVC-%Pred-Pre: 95 %
FEV6-%Change-Post: 11 %
FEV6-%Pred-Post: 94 %
FEV6-%Pred-Pre: 84 %
FEV6-Post: 4.23 L
FEV6-Pre: 3.78 L
FEV6FVC-%Change-Post: 1 %
FEV6FVC-%Pred-Post: 104 %
FEV6FVC-%Pred-Pre: 103 %
FVC-%Change-Post: 10 %
FVC-%Pred-Post: 90 %
FVC-%Pred-Pre: 82 %
FVC-Post: 4.25 L
FVC-Pre: 3.86 L
Post FEV1/FVC ratio: 74 %
Post FEV6/FVC ratio: 99 %
Pre FEV1/FVC ratio: 71 %
Pre FEV6/FVC Ratio: 98 %
RV % pred: 111 %
RV: 2.73 L
TLC % pred: 97 %
TLC: 7.03 L

## 2024-03-16 NOTE — Patient Instructions (Signed)
 Visit Information  Thank you for taking time to visit with me today. Please don't hesitate to contact me if I can be of assistance to you before our next scheduled appointment.  Our next appointment is by telephone on Monday, November 10th at 1:30pm  Please call the care guide team at [phone] if you need to cancel or reschedule your appointment.   Following is a copy of your care plan:   Goals Addressed             This Visit's Progress    VBCI RN Care Plan       Problems:  Knowledge Deficits related to Poor Dentition   Goal: Over the next 3 months the Patient will demonstrate Improved health management independence as evidenced by having dental work completed         Over the next 30 days patient will make appointment with oral surgeon for consult  Over the next 15 days patient will call dentist to inquire about payment options for dental work.   Interventions:   Health Maintenance Interventions: Patient interviewed about adult health maintenance status including  Regular Dental Care    Discussed [employer] credit option to pay for dental work, pulled up website to confirm he may have the option of no interest for 6, 12, 18, or 24 months and to speak to the dentist performing the work about these options.  Discussed barriers: he is scheduled for a lobectomy on 03/31/24 and wanting to heal from this procedure before moving forward with dental work.   Patient Self-Care Activities:  Will call dentist to discuss payment options, then will make appointment to get teeth pulled, partial plate or dentures placed.   Plan:  Telephone follow up appointment with care management team member scheduled for:  one month.          VBCI RN Care Plan       Problems:  Care Coordination needs related to Transportation  Goal: Over the next 15 days the Patient will demonstrate Ongoing health management independence as evidenced by learning about [facility] for  future needs.          Interventions:   Evaluation of current treatment plan related to transportation, Limited access to caregiver self-management and patient's adherence to plan as established by provider. Discussed plans with patient for ongoing care management follow up and provided patient with direct contact information for care management team Discussed transportation options due to patient states he may need alternative transportation if his sister isn't available for some upcoming MD appointments (he currently drives but may need someone else driving occasionally).  Provided contact information to [facility] at [phone].   Patient Self-Care Activities:  Patient will call [facility] for info on transportation.  Plan:  Telephone follow up appointment with care management team member scheduled for:  one month             A reminder to ALL patients/family/friends, please call the [place]  National Suicide Prevention Lifeline: (726)747-2664 or TTY: 8506038379 TTY 917 540 7845) to talk to a trained counselor if you are experiencing a Mental Health or Behavioral Health Crisis or need someone to talk to.  Patient verbalized understanding of Care plan and visit instructions communicated this visit  Santana Stamp BSN, [facility] [place]  [facility] Population Health RN Care Manager Direct Dial: [phone]  Fax: [phone]

## 2024-03-16 NOTE — Patient Outreach (Addendum)
 Complex Care Management   Visit Note  03/16/2024  Name:  Jesse [name] MRN: [id number] DOB: 12/01/1955  Situation: Referral received for Complex Care Management related to upcoming lobectomy, SDOH challenges, poor dentition.  I obtained verbal consent from Patient.  Visit completed with Jesse [name]  on the phone. [name] preparing for a lobectomy on 03/31/24, sister will assist with transportation.  He has been working with a few dentists for best pricing for tooth extraction/denture placement.  [name] also working with [employer] for food resources.   Background:   Past Medical History:  Diagnosis Date   Coronary artery disease    Depression 02/15/2014   Hypertension    Myocardial infarction [facility])    NSTEMI   Pre-diabetes    Pulmonary embolism (HCC)    Sleep disturbance 02/15/2014   Tremors of nervous system    Trochanteric bursitis of left hip 08/30/2018    Assessment: Patient Reported Symptoms:  Cognitive Cognitive Status: Struggling with memory recall, Alert and oriented to person, place, and time, Insightful and able to interpret abstract concepts, Normal speech and language skills (Patient reports some difficulty with memory recall.) Cognitive/Intellectual Conditions Management [RPT]: None reported or documented in medical history or problem list   Health Maintenance Behaviors: Annual physical exam  Neurological Neurological Review of Symptoms: No symptoms reported    HEENT HEENT Symptoms Reported: No symptoms reported, Other: HEENT Comment: No acute tooth pain at this time but he does need multiple teeth pulled for partial plate/dentures, he is currently exploring options for this, seeing dentists for best pricing.    Cardiovascular Cardiovascular Symptoms Reported: No symptoms reported    Respiratory Respiratory Symptoms Reported: Other: Other Respiratory Symptoms: Feels winded when he climbs stairs but resolves when he rests.    Endocrine Endocrine Symptoms Reported:  No symptoms reported Is patient diabetic?: No    Gastrointestinal Gastrointestinal Symptoms Reported: Constipation Additional Gastrointestinal Details: He will try prune juice, contemplating taking stool softeners      Genitourinary Genitourinary Symptoms Reported: Urgency Additional Genitourinary Details: Wakes up in the middle of the night around 3am to urinate. Occasional weak stream.  He has already seen a urologist back in April.    Integumentary Integumentary Symptoms Reported: No symptoms reported    Musculoskeletal Musculoskelatal Symptoms Reviewed: No symptoms reported Musculoskeletal Comment: Had a fall in 2024 due to left knee arthritis, no falls since. Falls in the past year?: No Patient at Risk for Falls Due to: History of fall(s)  Psychosocial Psychosocial Symptoms Reported: No symptoms reported     Quality of Family Relationships: helpful, supportive    03/16/2024    PHQ2-9 Depression Screening   Little interest or pleasure in doing things Not at all  Feeling down, depressed, or hopeless Not at all  PHQ-2 - Total Score 0  Trouble falling or staying asleep, or sleeping too much    Feeling tired or having little energy    Poor appetite or overeating     Feeling bad about yourself - or that you are a failure or have let yourself or your family down    Trouble concentrating on things, such as reading the newspaper or watching television    Moving or speaking so slowly that other people could have noticed.  Or the opposite - being so fidgety or restless that you have been moving around a lot more than usual    Thoughts that you would be better off dead, or hurting yourself in some way  PHQ2-9 Total Score    If you checked off any problems, how difficult have these problems made it for you to do your work, take care of things at home, or get along with other people    Depression Interventions/Treatment      There were no vitals filed for this visit.  Medications  Reviewed Today     Reviewed by Lucian Santana LABOR, RN (Registered Nurse) on 03/16/24 at 1414  Med List Status: <None>   Medication Order Taking? Sig Documenting Provider Last Dose Status Informant  amLODipine  (NORVASC ) 10 MG tablet [id number] Yes Take 1 tablet (10 mg total) by mouth daily. Franchester, Cadence H, PA-C  Active   aspirin  81 MG chewable tablet [id number] Yes Chew 81 mg by mouth daily. [provider]  Active Self, Pharmacy Records, Multiple Informants  buPROPion  (WELLBUTRIN  SR) 150 MG 12 hr tablet [id number] Yes Take 1 tablet (150 mg total) by mouth 2 (two) times daily. Zafirov, Clarissa A, MD  Active Self, Pharmacy Records, Multiple Informants  nicotine  (NICODERM CQ  - DOSED IN MG/24 HOURS) 21 mg/24hr patch [id number] Yes Place 1 patch (21 mg total) onto the skin daily. Zafirov, Clarissa A, MD  Active Self, Pharmacy Records, Multiple Informants  nitroGLYCERIN  (NITROSTAT ) 0.4 MG SL tablet [id number] Yes Place 1 tablet (0.4 mg total) under the tongue every 5 (five) minutes as needed for chest pain. End, Lonni, MD  Active Self, Pharmacy Records, Multiple Informants  propranolol  ER (INDERAL  LA) 60 MG 24 hr capsule [id number] Yes Take 1 capsule (60 mg total) by mouth daily. For tremor Everlene Parris LABOR, MD  Active Self, Pharmacy Records, Multiple Informants  rosuvastatin (CRESTOR) 40 MG tablet [id number] Yes Take 1 tablet (40 mg total) by mouth daily. Franchester Mikey DEL, PA-C  Active             Recommendation:   PCP follow up 03/24/24 Specialty provider follow-up : Thersia Shields/BSW 03/29/24; Left Lower Lobectomy scheduled 03/31/24; Cardiology in 2 months for labwork, 3 months for follow up.  Educated patient on speaking to dentist about [employer] credit card no-interest payment options, and provided contact information for [facility] in [place] for transportation option if needed in the future.    Follow Up Plan:   Telephone follow up appointment date/time:  Monday,  November 10th at 1;30pm.   Santana Lucian BSN, CCM [place]  [facility] Population Health RN Care Manager Direct Dial: [phone]  Fax: [phone]

## 2024-03-16 NOTE — Progress Notes (Signed)
 Called pt to inform of upcoming appts for dotatate PET scan and follow up with Dr. Babara after surgery. All questions answered during call. Informed that will call with appts once scheduled. Pt verbalized understanding.

## 2024-03-17 ENCOUNTER — Inpatient Hospital Stay

## 2024-03-23 DIAGNOSIS — C7A090 Malignant carcinoid tumor of the bronchus and lung: Secondary | ICD-10-CM | POA: Insufficient documentation

## 2024-03-23 NOTE — Progress Notes (Signed)
 Jesse [name]

## 2024-03-24 ENCOUNTER — Ambulatory Visit (INDEPENDENT_AMBULATORY_CARE_PROVIDER_SITE_OTHER)

## 2024-03-24 VITALS — BP 120/94 | HR 77 | Ht 71.0 in | Wt 194.0 lb

## 2024-03-24 DIAGNOSIS — I158 Other secondary hypertension: Secondary | ICD-10-CM

## 2024-03-24 DIAGNOSIS — C7A090 Malignant carcinoid tumor of the bronchus and lung: Secondary | ICD-10-CM

## 2024-03-24 DIAGNOSIS — R1310 Dysphagia, unspecified: Secondary | ICD-10-CM

## 2024-03-27 ENCOUNTER — Ambulatory Visit: Admission: RE | Admit: 2024-03-27 | Source: Ambulatory Visit

## 2024-03-28 NOTE — Pre-Procedure Instructions (Signed)
 Surgical Instructions   Your procedure is scheduled on March 31, 2024. Report to [facility] Main Entrance A at 5:30 A.M., then check in with the Admitting office. Any questions or running late day of surgery: call [phone]  Questions prior to your surgery date: call [phone], Monday-Friday, 8am-4pm. If you experience any cold or flu symptoms such as cough, fever, chills, shortness of breath, etc. between now and your scheduled surgery, please notify us  at the above number.     Remember:  Do not eat or drink after midnight the night before your surgery    Take these medicines the morning of surgery with A SIP OF WATER: amLODipine  (NORVASC )  buPROPion  (WELLBUTRIN  SR)  propranolol  ER (INDERAL  LA)  rosuvastatin (CRESTOR)    May take these medicines IF NEEDED: nitroGLYCERIN  (NITROSTAT ) - if dose taken prior to surgery, please call [phone]   One week prior to surgery, STOP taking any Aspirin  (unless otherwise instructed by your surgeon) Aleve, Naproxen, Ibuprofen, Motrin, Advil, Goody's, BC's, all herbal medications, fish oil, and non-prescription vitamins.                     Do NOT Smoke (Tobacco/Vaping) for 24 hours prior to your procedure.  If you use a CPAP at night, you may bring your mask/headgear for your overnight stay.   You will be asked to remove any contacts, glasses, piercing's, hearing aid's, dentures/partials prior to surgery. Please bring cases for these items if needed.    Patients discharged the day of surgery will not be allowed to drive home, and someone needs to stay with them for 24 hours.  SURGICAL WAITING ROOM VISITATION Patients may have no more than 2 support people in the waiting area - these visitors may rotate.   Pre-op nurse will coordinate an appropriate time for 1 ADULT support person, who may not rotate, to accompany patient in pre-op.  Children under the age of 81 must have an adult with them who is not the patient and must remain  in the main waiting area with an adult.  If the patient needs to stay at the hospital during part of their recovery, the visitor guidelines for inpatient rooms apply.  Please refer to the [facility] website for the visitor guidelines for any additional information.   If you received a COVID test during your pre-op visit  it is requested that you wear a mask when out in public, stay away from anyone that may not be feeling well and notify your surgeon if you develop symptoms. If you have been in contact with anyone that has tested positive in the last 10 days please notify you surgeon.      Pre-operative CHG Bathing Instructions   You can play a key role in reducing the risk of infection after surgery. Your skin needs to be as free of germs as possible. You can reduce the number of germs on your skin by washing with CHG (chlorhexidine gluconate) soap before surgery. CHG is an antiseptic soap that kills germs and continues to kill germs even after washing.   DO NOT use if you have an allergy to chlorhexidine/CHG or antibacterial soaps. If your skin becomes reddened or irritated, stop using the CHG and notify one of our RNs at [phone].              TAKE A SHOWER THE NIGHT BEFORE SURGERY   Please keep in mind the following:  DO NOT shave, including legs and underarms, 48  hours prior to surgery.   You may shave your face before/day of surgery.  Place clean sheets on your bed the night before surgery Use a clean washcloth (not used since being washed) for shower. DO NOT sleep with pet's night before surgery.  CHG Shower Instructions:  Wash your face and private area with normal soap. If you choose to wash your hair, wash first with your normal shampoo.  After you use shampoo/soap, rinse your hair and body thoroughly to remove shampoo/soap residue.  Turn the water OFF and apply half the bottle of CHG soap to a CLEAN washcloth.  Apply CHG soap ONLY FROM YOUR NECK DOWN TO YOUR TOES  (washing for 3-5 minutes)  DO NOT use CHG soap on face, private areas, open wounds, or sores.  Pay special attention to the area where your surgery is being performed.  If you are having back surgery, having someone wash your back for you may be helpful. Wait 2 minutes after CHG soap is applied, then you may rinse off the CHG soap.  Pat dry with a clean towel  Put on clean pajamas    Additional instructions for the day of surgery: If you choose, you may shower the morning of surgery with an antibacterial soap.  DO NOT APPLY any lotions, deodorants, cologne, or perfumes.   Do not wear jewelry or makeup Do not wear nail polish, gel polish, artificial nails, or any other type of covering on natural nails (fingers and toes) Do not bring valuables to the hospital. [facility] is not responsible for valuables/personal belongings. Put on clean/comfortable clothes.  Please brush your teeth.  Ask your nurse before applying any prescription medications to the skin.

## 2024-03-29 ENCOUNTER — Ambulatory Visit (HOSPITAL_COMMUNITY)
Admission: RE | Admit: 2024-03-29 | Discharge: 2024-03-29 | Disposition: A | Discharge status: Home / Self Care | Source: Ambulatory Visit | Attending: Thoracic Surgery (Cardiothoracic Vascular Surgery) | Admitting: Thoracic Surgery (Cardiothoracic Vascular Surgery)

## 2024-03-29 ENCOUNTER — Encounter (HOSPITAL_COMMUNITY): Payer: Self-pay

## 2024-03-29 ENCOUNTER — Other Ambulatory Visit: Payer: Self-pay

## 2024-03-29 ENCOUNTER — Telehealth: Payer: Self-pay

## 2024-03-29 ENCOUNTER — Encounter (HOSPITAL_COMMUNITY)
Admission: RE | Admit: 2024-03-29 | Discharge: 2024-03-29 | Disposition: A | Discharge status: Home / Self Care | Source: Ambulatory Visit | Attending: Thoracic Surgery (Cardiothoracic Vascular Surgery) | Admitting: Thoracic Surgery (Cardiothoracic Vascular Surgery)

## 2024-03-29 ENCOUNTER — Inpatient Hospital Stay: Attending: Oncology | Admitting: Hospice and Palliative Medicine

## 2024-03-29 VITALS — BP 128/96 | HR 78 | Temp 98.0°F | Resp 18 | Ht 71.0 in | Wt 197.0 lb

## 2024-03-29 DIAGNOSIS — Z96643 Presence of artificial hip joint, bilateral: Secondary | ICD-10-CM | POA: Diagnosis present

## 2024-03-29 DIAGNOSIS — C7A090 Malignant carcinoid tumor of the bronchus and lung: Secondary | ICD-10-CM | POA: Diagnosis not present

## 2024-03-29 DIAGNOSIS — Z01818 Encounter for other preprocedural examination: Secondary | ICD-10-CM | POA: Diagnosis not present

## 2024-03-29 DIAGNOSIS — I771 Stricture of artery: Secondary | ICD-10-CM | POA: Diagnosis not present

## 2024-03-29 DIAGNOSIS — D3A090 Benign carcinoid tumor of the bronchus and lung: Secondary | ICD-10-CM

## 2024-03-29 HISTORY — DX: Unspecified osteoarthritis, unspecified site: M19.90

## 2024-03-29 HISTORY — DX: Anxiety disorder, unspecified: F41.9

## 2024-03-29 LAB — CBC
HCT: 44.4 % (ref 39.0–52.0)
Hemoglobin: 14.8 g/dL (ref 13.0–17.0)
MCH: 29.4 pg (ref 26.0–34.0)
MCHC: 33.3 g/dL (ref 30.0–36.0)
MCV: 88.3 fL (ref 80.0–100.0)
Platelets: 202 K/uL (ref 150–400)
RBC: 5.03 MIL/uL (ref 4.22–5.81)
RDW: 13.3 % (ref 11.5–15.5)
WBC: 5.5 K/uL (ref 4.0–10.5)
nRBC: 0 % (ref 0.0–0.2)

## 2024-03-29 LAB — COMPREHENSIVE METABOLIC PANEL WITH GFR
ALT: 16 U/L (ref 0–44)
AST: 23 U/L (ref 15–41)
Albumin: 4 g/dL (ref 3.5–5.0)
Alkaline Phosphatase: 59 U/L (ref 38–126)
Anion gap: 11 (ref 5–15)
BUN: 10 mg/dL (ref 8–23)
CO2: 25 mmol/L (ref 22–32)
Calcium: 9.1 mg/dL (ref 8.9–10.3)
Chloride: 103 mmol/L (ref 98–111)
Creatinine, Ser: 1.03 mg/dL (ref 0.61–1.24)
GFR, Estimated: >60 mL/min (ref >60)
Glucose, Bld: 84 mg/dL (ref 70–99)
Potassium: 3.8 mmol/L (ref 3.5–5.1)
Sodium: 139 mmol/L (ref 135–145)
Total Bilirubin: 0.5 mg/dL (ref 0.0–1.2)
Total Protein: 7.9 g/dL (ref 6.5–8.1)

## 2024-03-29 LAB — TYPE AND SCREEN
ABO/RH(D): B POS
Antibody Screen: NEGATIVE

## 2024-03-29 LAB — URINALYSIS, ROUTINE W REFLEX MICROSCOPIC
Bilirubin Urine: NEGATIVE
Glucose, UA: NEGATIVE mg/dL
Ketones, ur: NEGATIVE mg/dL
Leukocytes,Ua: NEGATIVE
Nitrite: NEGATIVE
Protein, ur: NEGATIVE mg/dL
Specific Gravity, Urine: 1.01 (ref 1.005–1.030)
pH: 5 (ref 5.0–8.0)

## 2024-03-29 LAB — PROTIME-INR
INR: 0.9 (ref 0.8–1.2)
Prothrombin Time: 12.6 s (ref 11.4–15.2)

## 2024-03-29 LAB — SURGICAL PCR SCREEN
MRSA, PCR: NEGATIVE
Staphylococcus aureus: NEGATIVE

## 2024-03-29 LAB — APTT: aPTT: 29 s (ref 24–36)

## 2024-03-29 NOTE — Progress Notes (Signed)
 PCP - Dr. Parris [place] Cardiologist - Pt recently established with cardiology. Last office visit 03/15/2024 with NP  PPM/ICD - Denies Device Orders - n/a Rep Notified - n/a  Chest x-ray - 03/29/2024 EKG - 03/15/2024 Stress Test - Denies ECHO - 12/25/2021 (CE) Cardiac Cath - 12/26/2021 (CE)  Sleep Study - Denies CPAP - n/a  No DM  Last dose of GLP1 agonist- n/a GLP1 instructions: n/a  Blood Thinner Instructions: n/a Aspirin  Instructions: Pt instructed to continue taking ASA through the day before surgery and none the morning of surgery. He states he ran out of medication and needs to buy more.  NPO after midnight  COVID TEST- n/a   Anesthesia review: Yes. Cardiac clearance  Patient denies shortness of breath, fever, cough and chest pain at PAT appointment. Pt denies any respiratory illness/infection in the last two months.   All instructions explained to the patient, with a verbal understanding of the material. Patient agrees to go over the instructions while at home for a better understanding. Patient also instructed to self quarantine after being tested for COVID-19. The opportunity to ask questions was provided.

## 2024-03-29 NOTE — Progress Notes (Signed)
 Allyssa Krusinski, RN made aware of abnormal UA results, who will make Dr. Kerrin aware.

## 2024-03-29 NOTE — Patient Instructions (Signed)
 [facility] - I am sorry I was unable to reach you today for our scheduled appointment. I work with Zafirov, Clarissa A, MD and am calling to support your healthcare needs. Please contact me at [phone] at your earliest convenience. I look forward to speaking with you soon.   Thank you,  Thersia Hoar, BSW, MHA [place]  [facility] Social Worker, Population Health [phone]

## 2024-03-29 NOTE — Progress Notes (Signed)
 Multidisciplinary Oncology Council Documentation  Jesse [name] was presented by our [facility] on 03/29/2024, which included representatives from:  Palliative Care Dietitian  Physical/Occupational Therapist Nurse Navigator Genetics Social work Survivorship RN Financial Navigator Research RN   Jesse [name] currently presents with history of lung cancer  We reviewed previous medical and familial history, history of present illness, and recent lab results along with all available histopathologic and imaging studies. The MOC considered available treatment options and made the following recommendations/referrals:  SW  The MOC is a meeting of clinicians from various specialty areas who evaluate and discuss patients for whom a multidisciplinary approach is being considered. Final determinations in the plan of care are those of the provider(s).   Today's extended care, comprehensive team conference, Jesse [name] was not present for the discussion and was not examined.

## 2024-03-30 NOTE — Anesthesia Preprocedure Evaluation (Signed)
 Anesthesia Evaluation  Patient identified by MRN, date of birth, ID band Patient awake    Reviewed: Allergy & Precautions, NPO status , Patient's Chart, lab work & pertinent test results, reviewed documented beta blocker date and time   History of Anesthesia Complications Negative for: history of anesthetic complications  Airway Mallampati: II  TM Distance: >3 FB Neck ROM: Full    Dental  (+) Teeth Intact, Dental Advisory Given   Pulmonary neg recent URI, Current Smoker and Patient abstained from smoking. Carcinoid Tumor of L Lower Lobe   breath sounds clear to auscultation       Cardiovascular hypertension, Pt. on medications + CAD and + Past MI   Rhythm:Regular Rate:Normal  TTE 12/25/2021: 1.  Left ventricle: The cavity size is normal. Wall thickness is mildly increased. Systolic function is normal. The estimated ejection fraction is 65%, by the Teichholz method. Wall motion is normal; there are no regional wall motion abnormalities. Normal diastolic function.  2.  Right ventricle: The cavity size is normal. Wall thickness is normal. Systolic function is normal    Neuro/Psych   Anxiety Depression    Essential Tremor    GI/Hepatic ,neg GERD  ,,  Endo/Other  neg diabetes    Renal/GU Renal disease     Musculoskeletal   Abdominal   Peds  Hematology   Anesthesia Other Findings   Reproductive/Obstetrics                              Anesthesia Physical Anesthesia Plan  ASA: 3  Anesthesia Plan: General   Post-op Pain Management:    Induction: Intravenous  PONV Risk Score and Plan: 1 and Ondansetron , Dexamethasone and Treatment may vary due to age or medical condition  Airway Management Planned: Double Lumen EBT  Additional Equipment: Arterial line  Intra-op Plan:   Post-operative Plan: Extubation in OR  Informed Consent:      Dental advisory given  Plan Discussed with:  CRNA  Anesthesia Plan Comments: (PAT note by Lynwood Hope, PA-C: 68 year old male follows with cardiology for history of CAD with CTO of the left circumflex, prior PE (?unprovoked), HTN, HLD.  Seen by Mikey Eric, PA-C on 03/15/2024 for preop evaluation.  Per note, Patient is needing lower lung lobectomy for malignant tumor, scheduled for 03/31/2024.  Patient is overall stable from a cardiac perspective.  We are optimizing cholesterol and blood pressure at this time.  No further cardiac workup required prior to surgery.  Patient's overall function is good with METS greater than 4.  Okay to stop aspirin  perioperatively as it does not seem he has had any prior stenting. 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  Other pertinent history includes current smoker, dysphagia, essential tremor (on propranolol ).  Preop labs reviewed, WNL.  EKG 03/15/2024: NSR.  Rate 61.  Possible LAE.  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  2. LVEDP 7 mmHg  3. Likely elevated troponin in the setting of demand ischemia, type II MI   PLAN:  1.Continue to trend troponin to peak  2. Heparin drip until troponin peaks  3. If the patient continues to have continuing signs of  ischemia or  concerning symptoms, can consider CTO PCI of the left circumflex  4. Continue aspirin , statin  5. Beta-blocker may not be tolerated due to bradycardia   TTE 12/25/2021: 1. Left ventricle: The cavity size is normal. Wall thickness is mildly increased. Systolic function is normal. The estimated ejection fraction is 65%, by the Teichholz method. Wall motion is normal; there are  no regional wall motion abnormalities. Normal diastolic function.  2. Right ventricle: The cavity size is normal. Wall thickness is normal. Systolic function is normal.   )         Anesthesia Quick Evaluation

## 2024-03-30 NOTE — Progress Notes (Signed)
 Anesthesia Chart Review:  68 year old male follows with cardiology for history of CAD with CTO of the left circumflex, prior PE (?unprovoked), HTN, HLD.  Seen by Mikey Eric, PA-C on 03/15/2024 for preop evaluation.  Per note, Patient is needing lower lung lobectomy for malignant tumor, scheduled for 03/31/2024.  Patient is overall stable from a cardiac perspective.  We are optimizing cholesterol and blood pressure at this time.  No further cardiac workup required prior to surgery.  Patient's overall function is good with METS greater than 4.  Okay to stop aspirin  perioperatively as it does not seem he has had any prior stenting. 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  Other pertinent history includes current smoker, dysphagia, essential tremor (on propranolol ).  Preop labs reviewed, WNL.  EKG 03/15/2024: NSR.  Rate 61.  Possible LAE.  Pulmonary function testing 03/13/2024: FVC 3.86 (82%) FEV1 2.74 (78%) FEV1 3.14 (89%) postbronchodilator DLCO 21.01 (77%)  Cath 12/26/2021 (Care Everywhere): SUMMARY:  1. 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  2.  LVEDP 7 mmHg  3.  Likely elevated troponin in the setting of demand ischemia, type II MI   PLAN:  1. Continue to trend troponin to peak  2.  Heparin drip until troponin peaks  3.  If the patient continues to have continuing signs of ischemia or  concerning symptoms, can consider CTO PCI of the left circumflex  4.  Continue aspirin , statin  5.  Beta-blocker may not be tolerated due to bradycardia   TTE 12/25/2021: 1.  Left ventricle: The cavity size is normal. Wall thickness is mildly increased. Systolic function is normal. The estimated ejection fraction is 65%, by the Teichholz method. Wall motion is normal; there are no regional wall motion abnormalities.  Normal diastolic function.  2.  Right ventricle: The cavity size is normal. Wall thickness is normal. Systolic function is normal.     Lynwood Geofm RIGGERS [facility] Short Stay [facility]/Anesthesiology Phone [phone] 03/30/2024 9:16 AM

## 2024-03-31 ENCOUNTER — Encounter (HOSPITAL_COMMUNITY): Payer: Self-pay | Admitting: Thoracic Surgery (Cardiothoracic Vascular Surgery)

## 2024-03-31 ENCOUNTER — Inpatient Hospital Stay (HOSPITAL_COMMUNITY): Payer: Self-pay | Admitting: Physician Assistant

## 2024-03-31 ENCOUNTER — Encounter (HOSPITAL_COMMUNITY)
Admission: RE | Disposition: A | Discharge status: Home / Self Care | Payer: Self-pay | Source: Home / Self Care | Attending: Thoracic Surgery (Cardiothoracic Vascular Surgery)

## 2024-03-31 ENCOUNTER — Other Ambulatory Visit: Payer: Self-pay

## 2024-03-31 ENCOUNTER — Inpatient Hospital Stay (HOSPITAL_COMMUNITY)
Admission: RE | Admit: 2024-03-31 | Discharge: 2024-04-02 | DRG: 165 | Disposition: A | Discharge status: Home / Self Care | Attending: Thoracic Surgery (Cardiothoracic Vascular Surgery) | Admitting: Thoracic Surgery (Cardiothoracic Vascular Surgery)

## 2024-03-31 ENCOUNTER — Inpatient Hospital Stay (HOSPITAL_COMMUNITY)

## 2024-03-31 ENCOUNTER — Inpatient Hospital Stay (HOSPITAL_COMMUNITY): Payer: Self-pay | Admitting: Certified Registered Nurse Anesthetist

## 2024-03-31 DIAGNOSIS — Z8249 Family history of ischemic heart disease and other diseases of the circulatory system: Secondary | ICD-10-CM

## 2024-03-31 DIAGNOSIS — F17210 Nicotine dependence, cigarettes, uncomplicated: Secondary | ICD-10-CM | POA: Diagnosis present

## 2024-03-31 DIAGNOSIS — Z86711 Personal history of pulmonary embolism: Secondary | ICD-10-CM | POA: Diagnosis not present

## 2024-03-31 DIAGNOSIS — F32A Depression, unspecified: Secondary | ICD-10-CM | POA: Diagnosis present

## 2024-03-31 DIAGNOSIS — Z833 Family history of diabetes mellitus: Secondary | ICD-10-CM

## 2024-03-31 DIAGNOSIS — Z79899 Other long term (current) drug therapy: Secondary | ICD-10-CM | POA: Diagnosis not present

## 2024-03-31 DIAGNOSIS — Z881 Allergy status to other antibiotic agents status: Secondary | ICD-10-CM | POA: Diagnosis not present

## 2024-03-31 DIAGNOSIS — D3A090 Benign carcinoid tumor of the bronchus and lung: Secondary | ICD-10-CM

## 2024-03-31 DIAGNOSIS — C7A090 Malignant carcinoid tumor of the bronchus and lung: Secondary | ICD-10-CM | POA: Diagnosis present

## 2024-03-31 DIAGNOSIS — C3432 Malignant neoplasm of lower lobe, left bronchus or lung: Secondary | ICD-10-CM | POA: Diagnosis not present

## 2024-03-31 DIAGNOSIS — R1310 Dysphagia, unspecified: Secondary | ICD-10-CM | POA: Diagnosis present

## 2024-03-31 DIAGNOSIS — Z96643 Presence of artificial hip joint, bilateral: Secondary | ICD-10-CM | POA: Diagnosis present

## 2024-03-31 DIAGNOSIS — Z902 Acquired absence of lung [part of]: Secondary | ICD-10-CM

## 2024-03-31 DIAGNOSIS — I252 Old myocardial infarction: Secondary | ICD-10-CM | POA: Diagnosis not present

## 2024-03-31 DIAGNOSIS — F17200 Nicotine dependence, unspecified, uncomplicated: Secondary | ICD-10-CM

## 2024-03-31 DIAGNOSIS — I10 Essential (primary) hypertension: Secondary | ICD-10-CM | POA: Diagnosis present

## 2024-03-31 DIAGNOSIS — I2510 Atherosclerotic heart disease of native coronary artery without angina pectoris: Secondary | ICD-10-CM | POA: Diagnosis present

## 2024-03-31 DIAGNOSIS — E785 Hyperlipidemia, unspecified: Secondary | ICD-10-CM | POA: Diagnosis present

## 2024-03-31 DIAGNOSIS — Z880 Allergy status to penicillin: Secondary | ICD-10-CM | POA: Diagnosis not present

## 2024-03-31 DIAGNOSIS — Z4682 Encounter for fitting and adjustment of non-vascular catheter: Secondary | ICD-10-CM | POA: Diagnosis not present

## 2024-03-31 DIAGNOSIS — Z7982 Long term (current) use of aspirin: Secondary | ICD-10-CM

## 2024-03-31 DIAGNOSIS — D72829 Elevated white blood cell count, unspecified: Secondary | ICD-10-CM | POA: Diagnosis present

## 2024-03-31 DIAGNOSIS — Z48813 Encounter for surgical aftercare following surgery on the respiratory system: Secondary | ICD-10-CM | POA: Diagnosis not present

## 2024-03-31 DIAGNOSIS — R7303 Prediabetes: Secondary | ICD-10-CM | POA: Diagnosis present

## 2024-03-31 DIAGNOSIS — Z9889 Other specified postprocedural states: Principal | ICD-10-CM

## 2024-03-31 HISTORY — PX: SCALENE NODE BIOPSY: SHX5446

## 2024-03-31 HISTORY — PX: LOBECTOMY, LUNG, ROBOT-ASSISTED, USING VATS: SHX7607

## 2024-03-31 HISTORY — PX: INTERCOSTAL NERVE BLOCK: SHX5021

## 2024-03-31 LAB — ABO/RH: ABO/RH(D): B POS

## 2024-03-31 SURGERY — LOBECTOMY, LUNG, ROBOT-ASSISTED, USING VATS
Anesthesia: General | Site: Chest | Laterality: Left

## 2024-03-31 MED ORDER — SODIUM CHLORIDE 0.9% IV SOLUTION
INTRAVENOUS | Status: AC | PRN
  Administered 2024-03-31: 1000 mL via INTRAMUSCULAR

## 2024-03-31 MED ORDER — LIDOCAINE 2% (20 MG/ML) 5 ML SYRINGE
INTRAMUSCULAR | Status: DC | PRN
  Administered 2024-03-31: 100 mg via INTRAVENOUS

## 2024-03-31 MED ORDER — CEFAZOLIN SODIUM-DEXTROSE 2-4 GM/100ML-% IV SOLN
2.0000 g | Freq: Three times a day (TID) | INTRAVENOUS | Status: AC
  Administered 2024-03-31 (×2): 2 g via INTRAVENOUS
  Filled 2024-03-31 (×2): qty 100

## 2024-03-31 MED ORDER — HEMOSTATIC AGENTS (NO CHARGE) OPTIME
TOPICAL | Status: DC | PRN
  Administered 2024-03-31: 1 via TOPICAL

## 2024-03-31 MED ORDER — FENTANYL CITRATE (PF) 250 MCG/5ML IJ SOLN
INTRAMUSCULAR | Status: DC | PRN
  Administered 2024-03-31 (×3): 50 ug via INTRAVENOUS

## 2024-03-31 MED ORDER — DEXAMETHASONE SOD PHOSPHATE PF 10 MG/ML IJ SOLN
INTRAMUSCULAR | Status: DC | PRN
  Administered 2024-03-31: 10 mg via INTRAVENOUS

## 2024-03-31 MED ORDER — ENOXAPARIN SODIUM 40 MG/0.4ML IJ SOSY
40.0000 mg | PREFILLED_SYRINGE | Freq: Every day | INTRAMUSCULAR | Status: DC
  Administered 2024-03-31 – 2024-04-02 (×3): 40 mg via SUBCUTANEOUS
  Filled 2024-03-31 (×3): qty 0.4

## 2024-03-31 MED ORDER — FENTANYL CITRATE (PF) 250 MCG/5ML IJ SOLN
INTRAMUSCULAR | Status: AC
  Filled 2024-03-31: qty 5

## 2024-03-31 MED ORDER — KETOROLAC TROMETHAMINE 15 MG/ML IJ SOLN
INTRAMUSCULAR | Status: AC
  Filled 2024-03-31: qty 1

## 2024-03-31 MED ORDER — PHENYLEPHRINE HCL-NACL 20-0.9 MG/250ML-% IV SOLN
INTRAVENOUS | Status: DC | PRN
  Administered 2024-03-31: 50 ug/min via INTRAVENOUS
  Administered 2024-03-31: 25 ug/min via INTRAVENOUS

## 2024-03-31 MED ORDER — OXYCODONE HCL 5 MG/5ML PO SOLN: 5.0000 mg | Freq: Once | ORAL | Status: DC | PRN

## 2024-03-31 MED ORDER — BISACODYL 5 MG PO TBEC
10.0000 mg | DELAYED_RELEASE_TABLET | Freq: Every day | ORAL | Status: DC
  Administered 2024-03-31 – 2024-04-02 (×3): 10 mg via ORAL
  Filled 2024-03-31 (×3): qty 2

## 2024-03-31 MED ORDER — GABAPENTIN 300 MG PO CAPS
300.0000 mg | ORAL_CAPSULE | Freq: Every day | ORAL | Status: DC
  Administered 2024-03-31 – 2024-04-01 (×2): 300 mg via ORAL
  Filled 2024-03-31 (×2): qty 1

## 2024-03-31 MED ORDER — 0.9 % SODIUM CHLORIDE (POUR BTL) OPTIME
TOPICAL | Status: DC | PRN
  Administered 2024-03-31: 2000 mL

## 2024-03-31 MED ORDER — PROPOFOL 500 MG/50ML IV EMUL
INTRAVENOUS | Status: DC | PRN
  Administered 2024-03-31: 50 ug/kg/min via INTRAVENOUS

## 2024-03-31 MED ORDER — VANCOMYCIN HCL IN DEXTROSE 1-5 GM/200ML-% IV SOLN
1000.0000 mg | INTRAVENOUS | Status: AC
  Administered 2024-03-31: 1000 mg via INTRAVENOUS
  Filled 2024-03-31: qty 200

## 2024-03-31 MED ORDER — OXYCODONE HCL 5 MG PO TABS: 5.0000 mg | ORAL_TABLET | Freq: Once | ORAL | Status: DC | PRN

## 2024-03-31 MED ORDER — BUPIVACAINE LIPOSOME 1.3 % IJ SUSP
INTRAMUSCULAR | Status: AC
Start: 2024-03-31 — End: 2024-03-31
  Filled 2024-03-31: qty 20

## 2024-03-31 MED ORDER — ONDANSETRON HCL 4 MG/2ML IJ SOLN: 4.0000 mg | Freq: Four times a day (QID) | INTRAMUSCULAR | Status: DC | PRN

## 2024-03-31 MED ORDER — ACETAMINOPHEN 160 MG/5ML PO SOLN: 1000.0000 mg | Freq: Four times a day (QID) | ORAL | Status: DC

## 2024-03-31 MED ORDER — ALBUMIN HUMAN 5 % IV SOLN: INTRAVENOUS | Status: DC | PRN

## 2024-03-31 MED ORDER — ROCURONIUM BROMIDE 10 MG/ML (PF) SYRINGE
PREFILLED_SYRINGE | INTRAVENOUS | Status: DC | PRN
  Administered 2024-03-31 (×2): 30 mg via INTRAVENOUS
  Administered 2024-03-31: 70 mg via INTRAVENOUS

## 2024-03-31 MED ORDER — NICOTINE 21 MG/24HR TD PT24
21.0000 mg | MEDICATED_PATCH | Freq: Every day | TRANSDERMAL | Status: DC
  Administered 2024-04-01 – 2024-04-02 (×2): 21 mg via TRANSDERMAL
  Filled 2024-03-31 (×2): qty 1

## 2024-03-31 MED ORDER — SENNOSIDES-DOCUSATE SODIUM 8.6-50 MG PO TABS
1.0000 | ORAL_TABLET | Freq: Every day | ORAL | Status: DC
  Administered 2024-03-31 – 2024-04-01 (×2): 1 via ORAL
  Filled 2024-03-31 (×2): qty 1

## 2024-03-31 MED ORDER — ORAL CARE MOUTH RINSE: 15.0000 mL | Freq: Once | OROMUCOSAL | Status: AC

## 2024-03-31 MED ORDER — ROSUVASTATIN CALCIUM 20 MG PO TABS
40.0000 mg | ORAL_TABLET | Freq: Every day | ORAL | Status: DC
  Administered 2024-04-01 – 2024-04-02 (×2): 40 mg via ORAL
  Filled 2024-03-31 (×2): qty 2

## 2024-03-31 MED ORDER — FENTANYL CITRATE (PF) 100 MCG/2ML IJ SOLN
25.0000 ug | INTRAMUSCULAR | Status: DC | PRN
  Administered 2024-03-31: 50 ug via INTRAVENOUS

## 2024-03-31 MED ORDER — LACTATED RINGERS IV SOLN: INTRAVENOUS | Status: DC

## 2024-03-31 MED ORDER — ACETAMINOPHEN 10 MG/ML IV SOLN: 1000.0000 mg | Freq: Once | INTRAVENOUS | Status: DC | PRN

## 2024-03-31 MED ORDER — BUPIVACAINE HCL (PF) 0.5 % IJ SOLN: INTRAMUSCULAR | Status: DC | PRN

## 2024-03-31 MED ORDER — OXYCODONE HCL 5 MG PO TABS: 5.0000 mg | ORAL_TABLET | ORAL | Status: DC | PRN

## 2024-03-31 MED ORDER — PHENYLEPHRINE 80 MCG/ML (10ML) SYRINGE FOR IV PUSH (FOR BLOOD PRESSURE SUPPORT)
PREFILLED_SYRINGE | INTRAVENOUS | Status: DC | PRN
  Administered 2024-03-31 (×7): 80 ug via INTRAVENOUS

## 2024-03-31 MED ORDER — PANTOPRAZOLE SODIUM 40 MG PO TBEC
40.0000 mg | DELAYED_RELEASE_TABLET | Freq: Every day | ORAL | Status: DC
  Administered 2024-04-01 – 2024-04-02 (×2): 40 mg via ORAL
  Filled 2024-03-31 (×2): qty 1

## 2024-03-31 MED ORDER — BUPROPION HCL ER (SR) 150 MG PO TB12
150.0000 mg | ORAL_TABLET | Freq: Two times a day (BID) | ORAL | Status: DC
  Administered 2024-04-01 – 2024-04-02 (×3): 150 mg via ORAL
  Filled 2024-03-31 (×4): qty 1

## 2024-03-31 MED ORDER — SODIUM CHLORIDE FLUSH 0.9 % IV SOLN
INTRAVENOUS | Status: DC | PRN
  Administered 2024-03-31: 100 mL

## 2024-03-31 MED ORDER — SUGAMMADEX SODIUM 200 MG/2ML IV SOLN
INTRAVENOUS | Status: DC | PRN
  Administered 2024-03-31: 178.8 mg via INTRAVENOUS

## 2024-03-31 MED ORDER — GABAPENTIN 300 MG PO CAPS: 300.0000 mg | ORAL_CAPSULE | Freq: Two times a day (BID) | ORAL | Status: DC

## 2024-03-31 MED ORDER — AMLODIPINE BESYLATE 10 MG PO TABS
10.0000 mg | ORAL_TABLET | Freq: Every day | ORAL | Status: DC
  Administered 2024-04-01 – 2024-04-02 (×2): 10 mg via ORAL
  Filled 2024-03-31 (×2): qty 1

## 2024-03-31 MED ORDER — BUPIVACAINE HCL (PF) 0.5 % IJ SOLN
INTRAMUSCULAR | Status: AC
Start: 2024-03-31 — End: 2024-03-31
  Filled 2024-03-31: qty 30

## 2024-03-31 MED ORDER — DEXTROSE-SODIUM CHLORIDE 5-0.9 % IV SOLN: INTRAVENOUS | Status: AC

## 2024-03-31 MED ORDER — ONDANSETRON HCL 4 MG/2ML IJ SOLN
INTRAMUSCULAR | Status: DC | PRN
  Administered 2024-03-31: 4 mg via INTRAVENOUS

## 2024-03-31 MED ORDER — FENTANYL CITRATE (PF) 100 MCG/2ML IJ SOLN
INTRAMUSCULAR | Status: AC
  Filled 2024-03-31: qty 2

## 2024-03-31 MED ORDER — PROPRANOLOL HCL ER 60 MG PO CP24
60.0000 mg | ORAL_CAPSULE | Freq: Every day | ORAL | Status: DC
  Administered 2024-04-01 – 2024-04-02 (×2): 60 mg via ORAL
  Filled 2024-03-31 (×2): qty 1

## 2024-03-31 MED ORDER — CHLORHEXIDINE GLUCONATE 0.12 % MT SOLN
15.0000 mL | Freq: Once | OROMUCOSAL | Status: AC
  Administered 2024-03-31: 15 mL via OROMUCOSAL
  Filled 2024-03-31: qty 15

## 2024-03-31 MED ORDER — SODIUM CHLORIDE (PF) 0.9 % IJ SOLN
INTRAMUSCULAR | Status: AC
  Filled 2024-03-31: qty 50

## 2024-03-31 MED ORDER — KETOROLAC TROMETHAMINE 15 MG/ML IJ SOLN
15.0000 mg | Freq: Four times a day (QID) | INTRAMUSCULAR | Status: AC
  Administered 2024-03-31 – 2024-04-02 (×7): 15 mg via INTRAVENOUS
  Filled 2024-03-31 (×6): qty 1

## 2024-03-31 MED ORDER — TRAMADOL HCL 50 MG PO TABS: 50.0000 mg | ORAL_TABLET | Freq: Four times a day (QID) | ORAL | Status: DC | PRN

## 2024-03-31 MED ORDER — ONDANSETRON HCL 4 MG/2ML IJ SOLN: 4.0000 mg | Freq: Once | INTRAMUSCULAR | Status: DC | PRN

## 2024-03-31 MED ORDER — PROPOFOL 10 MG/ML IV BOLUS
INTRAVENOUS | Status: DC | PRN
  Administered 2024-03-31: 40 mg via INTRAVENOUS
  Administered 2024-03-31: 160 mg via INTRAVENOUS

## 2024-03-31 MED ORDER — DROPERIDOL 2.5 MG/ML IJ SOLN: 0.6250 mg | Freq: Once | INTRAMUSCULAR | Status: DC | PRN

## 2024-03-31 MED ORDER — ACETAMINOPHEN 500 MG PO TABS
1000.0000 mg | ORAL_TABLET | Freq: Four times a day (QID) | ORAL | Status: DC
  Administered 2024-03-31 – 2024-04-02 (×6): 1000 mg via ORAL
  Filled 2024-03-31 (×6): qty 2

## 2024-03-31 MED ORDER — MORPHINE SULFATE (PF) 2 MG/ML IV SOLN: 2.0000 mg | INTRAVENOUS | Status: DC | PRN

## 2024-03-31 SURGICAL SUPPLY — 61 items
CANISTER SUCTION 3000ML PPV (SUCTIONS) ×2 IMPLANT
CANNULA REDUCER 12-8 DVNC XI (CANNULA) ×2 IMPLANT
CNTNR URN SCR LID CUP LEK RST (MISCELLANEOUS) ×5 IMPLANT
CONN ST 1/4X3/8 BEN (MISCELLANEOUS) IMPLANT
DEFOGGER SCOPE WARM SEASHARP (MISCELLANEOUS) ×1 IMPLANT
DERMABOND ADVANCED .7 DNX12 (GAUZE/BANDAGES/DRESSINGS) ×1 IMPLANT
DRAIN CHANNEL 28F RND 3/8 FF (WOUND CARE) IMPLANT
DRAPE ARM DVNC X/XI (DISPOSABLE) ×4 IMPLANT
DRAPE COLUMN DVNC XI (DISPOSABLE) ×1 IMPLANT
DRAPE CV SPLIT W-CLR ANES SCRN (DRAPES) ×1 IMPLANT
DRAPE HALF SHEET 40X57 (DRAPES) IMPLANT
DRAPE INCISE IOBAN 66X45 STRL (DRAPES) IMPLANT
DRAPE SURG ORHT 6 SPLT 77X108 (DRAPES) ×1 IMPLANT
ELECT BLADE 6.5 EXT (BLADE) ×1 IMPLANT
ELECTRODE REM PT RTRN 9FT ADLT (ELECTROSURGICAL) ×1 IMPLANT
FORCEPS BPLR FENES DVNC XI (FORCEP) IMPLANT
FORCEPS BPLR LNG DVNC XI (INSTRUMENTS) IMPLANT
GAUZE KITTNER 4X5 RF (MISCELLANEOUS) ×2 IMPLANT
GAUZE SPONGE 4X4 12PLY STRL (GAUZE/BANDAGES/DRESSINGS) ×1 IMPLANT
GLOVE SS BIOGEL STRL SZ 7.5 (GLOVE) ×3 IMPLANT
GLOVE SURG POLYISO LF SZ8 (GLOVE) ×1 IMPLANT
GOWN STRL REUS W/ TWL LRG LVL3 (GOWN DISPOSABLE) ×2 IMPLANT
GOWN STRL REUS W/ TWL XL LVL3 (GOWN DISPOSABLE) ×2 IMPLANT
GOWN STRL REUS W/TWL 2XL LVL3 (GOWN DISPOSABLE) ×1 IMPLANT
GRASPER TIP-UP FEN DVNC XI (INSTRUMENTS) IMPLANT
HEMOSTAT SURGICEL 2X14 (HEMOSTASIS) ×3 IMPLANT
IRRIGATION STRYKERFLOW (MISCELLANEOUS) ×1 IMPLANT
KIT BASIN OR (CUSTOM PROCEDURE TRAY) ×1 IMPLANT
KIT TURNOVER KIT B (KITS) ×1 IMPLANT
NDL HYPO 25GX1X1/2 BEV (NEEDLE) ×1 IMPLANT
NDL SPNL 22GX3.5 QUINCKE BK (NEEDLE) ×1 IMPLANT
NEEDLE HYPO 25GX1X1/2 BEV (NEEDLE) ×1 IMPLANT
NEEDLE SPNL 22GX3.5 QUINCKE BK (NEEDLE) ×1 IMPLANT
PACK CHEST (CUSTOM PROCEDURE TRAY) ×1 IMPLANT
PAD ARMBOARD POSITIONER FOAM (MISCELLANEOUS) ×2 IMPLANT
PORT ACCESS TROCAR AIRSEAL 12 (TROCAR) ×1 IMPLANT
RELOAD STAPLE 45 2.0 GRY DVNC (STAPLE) IMPLANT
RELOAD STAPLE 45 2.5 WHT DVNC (STAPLE) IMPLANT
RELOAD STAPLE 45 3.5 BLU DVNC (STAPLE) IMPLANT
RELOAD STAPLE 45 4.3 GRN DVNC (STAPLE) IMPLANT
RELOAD STAPLE 45 4.6 BLK DVNC (STAPLE) IMPLANT
SEAL UNIV 5-12 XI (MISCELLANEOUS) ×4 IMPLANT
SET TRI-LUMEN FLTR TB AIRSEAL (TUBING) ×1 IMPLANT
SOLN 0.9% NACL POUR BTL 1000ML (IV SOLUTION) ×2 IMPLANT
SOLN STERILE WATER BTL 1000 ML (IV SOLUTION) ×2 IMPLANT
SOLUTION ELECTROSURG ANTI STCK (MISCELLANEOUS) ×1 IMPLANT
SPONGE TONSIL 1 RF SGL (DISPOSABLE) IMPLANT
SUT PROLENE 4-0 RB1 .5 CRCL 36 (SUTURE) IMPLANT
SUT SILK 1 MH (SUTURE) ×1 IMPLANT
SUT SILK 2 0 SH (SUTURE) ×1 IMPLANT
SUT VIC AB 1 CTX36XBRD ANBCTR (SUTURE) ×1 IMPLANT
SUT VIC AB 2-0 CTX 36 (SUTURE) ×1 IMPLANT
SUT VIC AB 3-0 X1 27 (SUTURE) ×2 IMPLANT
SUT VICRYL 0 TIES 12 18 (SUTURE) ×1 IMPLANT
SUT VICRYL 0 UR6 27IN ABS (SUTURE) ×2 IMPLANT
SYR 20CC LL (SYRINGE) ×2 IMPLANT
SYSTEM RETRIEVAL ANCHOR 15 (MISCELLANEOUS) IMPLANT
SYSTEM SAHARA CHEST DRAIN ATS (WOUND CARE) ×1 IMPLANT
TAPE CLOTH 4X10 WHT NS (GAUZE/BANDAGES/DRESSINGS) ×1 IMPLANT
TOWEL GREEN STERILE (TOWEL DISPOSABLE) ×1 IMPLANT
TRAY FOLEY MTR SLVR 16FR STAT (SET/KITS/TRAYS/PACK) ×1 IMPLANT

## 2024-03-31 NOTE — Anesthesia Procedure Notes (Signed)
 Procedure Name: Intubation Date/Time: 03/31/2024 7:41 AM  Performed by: Zelphia Norleen HERO, CRNAPre-anesthesia Checklist: Patient identified, Emergency Drugs available, Suction available and Patient being monitored Patient Re-evaluated:Patient Re-evaluated prior to induction Oxygen Delivery Method: Circle system utilized Preoxygenation: Pre-oxygenation with 100% oxygen Induction Type: IV induction Ventilation: Mask ventilation without difficulty and Oral airway inserted - appropriate to patient size Laryngoscope Size: Mac and 4 Grade View: Grade II Tube type: Oral Endobronchial tube: Double lumen EBT, EBT position confirmed by fiberoptic bronchoscope, Left and EBT position confirmed by auscultation and 41 Fr Number of attempts: 1 Airway Equipment and Method: Stylet and Oral airway Placement Confirmation: ETT inserted through vocal cords under direct vision, positive ETCO2 and breath sounds checked- equal and bilateral Tube secured with: Tape Dental Injury: Teeth and Oropharynx as per pre-operative assessment

## 2024-03-31 NOTE — Discharge Summary (Signed)
 Physician Discharge Summary                [address]               Thurmon BROCKS [place], [place] [address]                      [phone]   Patient ID: Jesse [name] MRN: [id number] DOB/AGE: 01-30-1956 68 y.o.  Admit date: 03/31/2024 Discharge date: 04/02/2024  Admission Diagnoses:  Discharge Diagnoses:  Principal Problem:   Status post robot-assisted surgical procedure Active Problems:   Status post lobectomy of lung   Consults: None  Procedure (s): Surgery PRE-OPERATIVE DIAGNOSIS:  Atypical carcinoid left lower lobe   POST-OPERATIVE DIAGNOSIS:  Atypical carcinoid left lower lobe   PROCEDURE:  Procedure(s) with comments: LOBECTOMY, LUNG, ROBOT-ASSISTED, USING VATS (Left) - Robotic left lower lobectomy BLOCK, NERVE, INTERCOSTAL (Left) BIOPSY, LYMPH NODE (Left)   SURGEON:  Surgeons and Role:    * Kerrin Elspeth BROCKS, MD - Primary   PHYSICIAN ASSISTANT: WAYNE GOLD PA-C   HPI: At time of thoracic surgical consultation  Jesse [name] is sent for consultation regarding a carcinoid tumor of the left lower lobe.   Jesse [name] is a 68 year old male with a history of tobacco abuse, CAD, MI, PE, hypertension, prediabetes, tremors, gynecomastia and depression.  He smoked about three fourths of a pack of cigarettes daily for 40 years.  Currently trying to quit and down to about 1 to 3 cigarettes/day.  [name] taking Chantix and using a nicotine  patch.   He had been feeling poorly for quite some time and he lost significant amount of weight over a couple years.  He established with Dr. Zafirov.  Because of his smoking history he had a low-dose CT for lung cancer screening.  It showed a 2.5 cm central left lower lobe mass.  PET/CT showed the mass was hypermetabolic.  He underwent navigational bronchoscopy by Dr. Shelah which showed atypical carcinoid tumor.   25 pound weight loss over past 4 years.  Has gained 4 pounds in the past 3 months.  No anginal type chest pain,  pressure, or tightness.  Does have chest pain associated with cough.  Complains of difficulty swallowing.  Can occur anytime during the day but most frequent in the morning.  Noted to have soft tissue thickening and an area of hypermetabolism in his nasopharynx on PET.  He was also seen by ENT.  Dr. Kerrin evaluated the patient and all relevant studies and recommended proceeding with a robotic assisted left lower lobectomy.  Hospital course:  The patient was admitted electively on 03/31/2024 and was taken to the operating room at which time he underwent a robotic assisted left lower lobectomy with lymph node dissection. He tolerated the procedure well and was taken to the postanesthesia care unit in stable condition. He had no pneumothorax on CXR or air leak on POD1. Chest tube was removed on POD1 without complication. Follow up CXR showed no sign of pneumothorax. The patient was ambulating well on room air and tolerating a diet. His incisions were healing well without sign of infection. He was felt stable for discharge home.     Latest Vital Signs: Blood pressure 114/75, pulse 60, temperature 97.9 F (36.6 C), temperature source Oral, resp. rate 17, height 5' 11 (1.803 m), weight 89.4 kg, SpO2 99%.  Physical Exam:General appearance: alert, cooperative, and no distress Neurologic: intact Heart: SB-NSR Lungs: clear to auscultation bilaterally  Abdomen: soft, non-tender; bowel sounds normal; no masses,  no organomegaly Extremities: extremities normal, atraumatic, no cyanosis or edema Wound: Clean and dry without sign of infection  Discharge Condition: Stable  Recent laboratory studies:  Lab Results  Component Value Date   WBC 13.7 (H) 04/02/2024   HGB 13.0 04/02/2024   HCT 37.9 (L) 04/02/2024   MCV 86.1 04/02/2024   PLT 211 04/02/2024   Lab Results  Component Value Date   NA 137 04/02/2024   K 4.0 04/02/2024   CL 105 04/02/2024   CO2 22 04/02/2024   CREATININE 1.01 04/02/2024    GLUCOSE 93 04/02/2024      Diagnostic Studies: DG Chest 2 View Result Date: 04/02/2024 CLINICAL DATA:  Pneumothorax. EXAM: CHEST - 2 VIEW COMPARISON:  Radiograph yesterday FINDINGS: No visualized pneumothorax. Increasing retrocardiac opacity. Subsegmental opacities in the periphery of the left lung base, as well as right lung base. Overall low lung volumes. Stable heart size and mediastinal contours. Possible left pleural effusion. IMPRESSION: 1. No visualized pneumothorax. 2. Increasing retrocardiac opacity may represent atelectasis, developing airspace disease or loculated pleural fluid. 3. Additional subsegmental opacities in the periphery of the left lung base, as well as right lung base, likely atelectasis. Electronically Signed   By: Andrea Gasman M.D.   On: 04/02/2024 12:11   DG Chest Port 1 View Result Date: 04/01/2024 EXAM: 1 VIEW(S) XRAY OF THE CHEST 04/01/2024 03:27:00 PM COMPARISON: 04/01/2024 CLINICAL HISTORY: Pneumothorax FINDINGS: LINES, TUBES AND DEVICES: Left chest tube removed. LUNGS AND PLEURA: Low lung volumes. Bibasilar atelectasis. HEART AND MEDIASTINUM: No acute abnormality of the cardiac and mediastinal silhouettes. BONES AND SOFT TISSUES: No acute osseous abnormality. IMPRESSION: 1. No pneumothorax following chest tube removal on the left 2. Low lung volumes with bibasilar atelectasis. Electronically signed by: Oneil Devonshire MD 04/01/2024 07:14 PM EST RP Workstation: [id number]   DG Chest Port 1 View Result Date: 04/01/2024 EXAM: 1 VIEW(S) XRAY OF THE CHEST 04/01/2024 06:40:00 AM COMPARISON: 03/31/2024 CLINICAL HISTORY: S/P lobectomy of lung FINDINGS: LINES, TUBES AND DEVICES: Left chest tube stable in place. LUNGS AND PLEURA: Low lung volumes. Stable bibasilar atelectasis. No focal pulmonary opacity. No pulmonary edema. No pleural effusion. No pneumothorax. HEART AND MEDIASTINUM: No acute abnormality of the cardiac and mediastinal silhouettes. BONES AND SOFT TISSUES: No acute  osseous abnormality. IMPRESSION: 1. Stable bibasilar atelectasis and low lung volumes. 2. Left chest tube stable in place. No pneumothorax Electronically signed by: Rockey Kilts MD 04/01/2024 10:33 AM EST RP Workstation: [id number]   DG Chest Port 1 View Result Date: 03/31/2024 EXAM: 1 VIEW(S) XRAY OF THE CHEST 03/31/2024 11:36:00 AM COMPARISON: Chest x-ray 03/29/2013. Chest CT 02/03/2004. CLINICAL HISTORY: S/P lobectomy of lung. FINDINGS: LINES, TUBES AND DEVICES: New left-sided chest tube in place. LUNGS AND PLEURA: No focal pulmonary opacity. No pulmonary edema. No pleural effusion. No pneumothorax. No new bands of atelectasis in the right lower lung. HEART AND MEDIASTINUM: No acute abnormality of the cardiac and mediastinal silhouettes. BONES AND SOFT TISSUES: No acute osseous abnormality. IMPRESSION: 1. No acute process. 2. New left-sided chest tube in place. Electronically signed by: Greig Pique MD 03/31/2024 08:35 PM EST RP Workstation: [id number]   DG Chest 2 View Result Date: 03/30/2024 CLINICAL DATA:  Preop evaluation.  Atypical carcinoid lung tumor. EXAM: CHEST - 2 VIEW COMPARISON:  Radiograph 03/06/2024, CT 02/03/2024 FINDINGS: The cardiomediastinal contours are stable. Aortic tortuosity. Left infrahilar fiducial marker, known nodule is faintly visualized. Pulmonary vasculature is normal. No consolidation, pleural effusion, or  pneumothorax. No acute osseous abnormalities are seen. IMPRESSION: 1. No acute chest findings. 2. Left infrahilar fiducial marker, known nodule is faintly visualized. Electronically Signed   By: Andrea Gasman M.D.   On: 03/30/2024 17:42   DG Chest Port 1 View Result Date: 03/06/2024 CLINICAL DATA:  Status post bronchoscopy. EXAM: PORTABLE CHEST 1 VIEW COMPARISON:  06/12/2016 and CT chest 02/03/2024. FINDINGS: Trachea is midline. Heart size stable. Lungs are somewhat low in volume with minimal bibasilar atelectasis. Fiducial marker in the left lower lobe. No  pneumothorax. No pleural fluid. IMPRESSION: 1. No pneumothorax status post bronchoscopy. 2. Known left lower lobe nodule, better seen on CT chest 02/03/2024. Electronically Signed   By: Newell Eke M.D.   On: 03/06/2024 10:11   DG C-ARM BRONCHOSCOPY Result Date: 03/06/2024 C-ARM BRONCHOSCOPY: Fluoroscopy was utilized by the requesting physician.  No radiographic interpretation.     Discharge Medications: Allergies as of 04/02/2024       Reactions   Bactrim [sulfamethoxazole-trimethoprim] Other (See Comments)   Acute kidney injury   Penicillins Hives        Medication List     TAKE these medications    acetaminophen  325 MG tablet Commonly known as: Tylenol  Take 2 tablets (650 mg total) by mouth every 6 (six) hours as needed for mild pain (pain score 1-3).   amLODipine  10 MG tablet Commonly known as: NORVASC  Take 1 tablet (10 mg total) by mouth daily.   buPROPion  150 MG 12 hr tablet Commonly known as: Wellbutrin  SR Take 1 tablet (150 mg total) by mouth 2 (two) times daily.   gabapentin 300 MG capsule Commonly known as: Neurontin Take 1 capsule (300 mg total) by mouth at bedtime.   nicotine  21 mg/24hr patch Commonly known as: NICODERM CQ  - dosed in mg/24 hours Place 1 patch (21 mg total) onto the skin daily.   nitroGLYCERIN  0.4 MG SL tablet Commonly known as: NITROSTAT  Place 1 tablet (0.4 mg total) under the tongue every 5 (five) minutes as needed for chest pain.   propranolol  ER 60 MG 24 hr capsule Commonly known as: Inderal  LA Take 1 capsule (60 mg total) by mouth daily. For tremor   rosuvastatin 40 MG tablet Commonly known as: CRESTOR Take 1 tablet (40 mg total) by mouth daily.   traMADol 50 MG tablet Commonly known as: ULTRAM Take 1 tablet (50 mg total) by mouth every 6 (six) hours as needed for severe pain (pain score 7-10).        Follow Up Appointments:  Follow-up Information     Kerrin Elspeth BROCKS, MD Follow up on 04/18/2024.   Specialty:  Cardiothoracic Surgery Why: Appointment is at 4:30PM. Please get a chest xray on the 2nd floor of our building 1 hour prior to your appointment Contact information: [address] [place] [address] [phone]                 Signed: Con GORMAN [place], PA-C 04/02/2024, 12:22 PM

## 2024-03-31 NOTE — Op Note (Signed)
 Jesse Jesse [name], Jesse [name] MEDICAL RECORD NO: [id number] ACCOUNT NO: [id number] DATE OF BIRTH: [birth year] FACILITY: [facility] LOCATION: [facility] PHYSICIAN: Jesse BROCKS. Kerrin, MD  Operative Report   DATE OF PROCEDURE: 03/31/2024  PREOPERATIVE DIAGNOSIS:  Atypical carcinoid left lower lobe, clinical stage IA (T1, N0).  POSTOPERATIVE DIAGNOSIS: Atypical carcinoid left lower lobe, clinical stage IA (T1, N0).  PROCEDURE: Xi robotic-assisted left lower lobectomy, lymph node dissection, and intercostal nerve blocks levels 3 through 10.  SURGEON:  Jesse BROCKS. Kerrin, MD  ASSISTANT:  Jesse Cera, PA.  Experienced assistance was necessary for this case due to surgical complexity.  Jesse Jesse [name] assisted with port placement, robot docking and undocking, instrument exchange, specimen retrieval, suctioning, and wound closure.  ANESTHESIA:  General.  FINDINGS:  Enlarged, but otherwise benign-appearing lymph nodes.  Bronchial margin negative for tumor on frozen section.  CLINICAL NOTE: Mr. Jesse Jesse [name] is a 68 year old gentleman who was found to have a 2.5 cm mass centrally in his left lower lobe on a low-dose CT for lung cancer screening.  On PET, the mass was hypermetabolic.  Navigational bronchoscopy showed an atypical carcinoid tumor.  He was referred for surgical resection.  The indications, risks, benefits, and alternatives were discussed in detail with the patient.  He understood and accepted the risks and agreed to proceed.  OPERATIVE NOTE: [name] was brought to the operating room on 03/31/2024.  He had induction of general anesthesia and was intubated with a double lumen endotracheal tube.  Intravenous antibiotics were administered.  Sequential compression devices were placed on the calves for DVT prophylaxis.  A Foley catheter was placed.  He was placed in a right lateral decubitus position.  A Bair Hugger was placed for active warming.  The left chest was prepped and draped in the usual sterile  fashion.  Single-lung ventilation of the right lung was initiated and was tolerated well throughout the procedure.  A time-out was performed.  A solution containing 20 mL of liposomal bupivacaine, 30 mL of 0.5% bupivacaine, and 50 mL of saline was prepared.  This solution was used for local at the incision sites as well as for the intercostal nerve blocks.  An incision was made in the 9th interspace in the midaxillary line and an 8-mm robotic port was inserted.  The thoracoscope was advanced into the chest.  After confirming intrapleural placement, carbon dioxide was insufflated per protocol.  A 12-mm robotic port was placed in the 8th interspace anterior to the camera port.  Intercostal nerve blocks then were performed from the 3rd to the 10th interspace by injecting 10 mL of bupivacaine solution into a subpleural plane at each level.  A 12-mm AirSeal port was placed in the 10th interspace posterolaterally and two additional 8th interspace robotic ports were placed.  The robot was deployed.  The camera arm was docked.  Targeting was performed.  The remaining arms were docked.  The robotic instruments were inserted with thoracoscopic visualization.  The lung was retracted superiorly.  The inferior ligament was divided with bipolar cautery.  A level 9 lymph node was removed.  Anteriorly, the fatty tissue in the hilum was dissected off the anterior portion of the vein and a level 10 node was removed.  The lung was retracted anteriorly and the pleural reflection was divided over the hilum posteriorly.  A level 8 node was removed, and then there was one relatively small level 7 node and a much larger, but otherwise benign-appearing level 7 node, which was removed.  The initial dissection  on the pulmonary artery was begun from the posterior approach with the overlying pleura removed.  A level 12 node was removed.  The lung then was retracted inferiorly and the pleural reflection was divided over the hilum anteriorly  and the inferior and superior pulmonary veins were identified.  Attention was then turned to the fissure.  The fissure was essentially complete.  One stapler firing was required anteriorly.  The remainder of the fissure dissection was done with bipolar cautery.  There was a very small 1-2 mm aberrant PA branch arising from the basilar segmental trunk of the artery and going into the very distal end of the lingula.  This was divided later in the procedure with a stapler.  The dissection was carried down and the anterior portion of the fissure was completed with a stapler and then a large, but otherwise benign-appearing level 11 lymph node was removed.  Working posteriorly, the fissure was completed and then a level 13 node was removed at the bifurcation of the superior segmental artery from the basilar trunk.  The superior segmental artery was encircled and divided with a robotic stapler.  Next, the inferior vein was encircled and divided with the stapler.  Finally, additional dissection freed up the basilar segmental trunk from the left lower lobe bronchus and it was divided with the stapler as well.  The stapler then was placed across the left lower lobe bronchus at its origin and closed.  A test inflation showed good aeration of the upper lobe and the stapler was fired transecting the left lower lobe bronchus.  The chest was copiously irrigated with saline.  A test inflation under 30 cm of water pressure revealed no air leakage from the bronchial stump or fissures.  The robotic instruments were removed and the robot was undocked.  The anterior 8th interspace incision was lengthened to 3 cm.  A 15-mm endoscopic retrieval bag was placed in the chest and the lower lobe was manipulated into the bag, then removed and sent for a frozen section of the bronchial margin, which subsequently returned with no tumor seen.  A final inspection was made for hemostasis at all staple lines and port sites.  A 28-French chest tube  was placed through the original port incision and directed to the apex.  It was secured with #1 silk suture.  Dual lung ventilation was resumed.  The remaining incisions were closed in standard fashion.  All sponge, needle, and instrument counts were correct at the end of the procedure.  The patient was placed in the supine position.  The chest tube was placed to a Pleur-evac on waterseal.  He was extubated in the operating room and taken to the postanesthesia care unit in good condition.   PUS D: 03/31/2024 2:51:44 pm T: 03/31/2024 6:48:00 pm  JOB: [id number]/ [id number]

## 2024-03-31 NOTE — Plan of Care (Signed)

## 2024-03-31 NOTE — Hospital Course (Addendum)
 HPI: At time of thoracic surgical consultation  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   Jesse [name] is a 68 year old male with a history of tobacco abuse, CAD, MI, PE, hypertension, prediabetes, tremors, gynecomastia and depression.  He smoked about three fourths of a pack of cigarettes daily for 40 years.  Currently trying to quit and down to about 1 to 3 cigarettes/day.  [name] taking Chantix and using a nicotine  patch.   He had been feeling poorly for quite some time and he lost significant amount of weight over a couple years.  He established with Dr. Zafirov.  Because of his smoking history he had a low-dose CT for lung cancer screening.  It showed a 2.5 cm central left lower lobe mass.  PET/CT showed the mass was hypermetabolic.  He underwent navigational bronchoscopy by Dr. Shelah which showed atypical carcinoid tumor.   25 pound weight loss over past 4 years.  Has gained 4 pounds in the past 3 months.  No anginal type chest pain, pressure, or tightness.  Does have chest pain associated with cough.  Complains of difficulty swallowing.  Can occur anytime during the day but most frequent in the morning.  Noted to have soft tissue thickening and an area of hypermetabolism in his nasopharynx on PET.  He was also seen by ENT.  Dr. Kerrin evaluated the patient and all relevant studies and recommended proceeding with a robotic assisted left lower lobectomy.  Hospital course:  The patient was admitted electively on 03/31/2024 and was taken to the operating room at which time he underwent a robotic assisted left lower lobectomy with lymph node dissection. He tolerated the procedure well and was taken to the postanesthesia care unit in stable condition. He had no pneumothorax on CXR or air leak on POD1. Chest tube was removed on POD1 without complication. Follow up CXR showed no sign of pneumothorax. The patient was ambulating well on room air and  tolerating a diet. His incisions were healing well without sign of infection. He was felt stable for discharge home.

## 2024-03-31 NOTE — Anesthesia Postprocedure Evaluation (Signed)
 Anesthesia Post Note  Patient: Jesse [name]  Procedure(s) Performed: LOBECTOMY, LUNG, ROBOT-ASSISTED, USING VATS (Left: Chest) BLOCK, NERVE, INTERCOSTAL (Left: Chest) BIOPSY, LYMPH NODE (Left: Chest)     Patient location during evaluation: PACU Anesthesia Type: General Level of consciousness: awake Pain management: pain level controlled Vital Signs Assessment: post-procedure vital signs reviewed and stable Respiratory status: spontaneous breathing Cardiovascular status: blood pressure returned to baseline Postop Assessment: no apparent nausea or vomiting Anesthetic complications: no   No notable events documented.                 Lauraine KATHEE [place]

## 2024-03-31 NOTE — Transfer of Care (Signed)
 Immediate Anesthesia Transfer of Care Note  Patient: Gaspar Fowle  Procedure(s) Performed: LOBECTOMY, LUNG, ROBOT-ASSISTED, USING VATS (Left: Chest) BLOCK, NERVE, INTERCOSTAL (Left: Chest) BIOPSY, LYMPH NODE (Left: Chest)  Patient Location: PACU  Anesthesia Type:General  Level of Consciousness: drowsy  Airway & Oxygen Therapy: Patient Spontanous Breathing and Patient connected to nasal cannula oxygen  Post-op Assessment: Report given to RN and Post -op Vital signs reviewed and stable  Post vital signs: Reviewed and stable  Last Vitals:  Vitals Value Taken Time  BP 130/80   Temp 98   Pulse 81 03/31/24 10:58  Resp 20 03/31/24 10:58  SpO2 100 % 03/31/24 10:58  Vitals shown include unfiled device data.  Last Pain:  Vitals:   03/31/24 0616  TempSrc:   PainSc: 0-No pain         Complications: No notable events documented.

## 2024-03-31 NOTE — Anesthesia Procedure Notes (Signed)
 Arterial Line Insertion Start/End11/11/2023 7:05 AM, 03/31/2024 7:10 AM Performed by: Zelphia Norleen HERO, CRNA, CRNA  Patient location: Pre-op. Preanesthetic checklist: patient identified, IV checked, site marked, risks and benefits discussed, surgical consent, monitors and equipment checked, pre-op evaluation, timeout performed and anesthesia consent Lidocaine 1% used for infiltration Left, radial was placed Catheter size: 20 G Hand hygiene performed  and maximum sterile barriers used   Attempts: 1 Following insertion, dressing applied and Biopatch. Post procedure assessment: normal and unchanged  Patient tolerated the procedure well with no immediate complications.

## 2024-03-31 NOTE — Brief Op Note (Addendum)
 03/31/2024  10:39 AM  PATIENT:  Jesse [name]  68 y.o. male  PRE-OPERATIVE DIAGNOSIS:  Atypical carcinoid left lower lobe- Clinical stage IA (T1,N0)  POST-OPERATIVE DIAGNOSIS:  Atypical carcinoid left lower lobe- Clinical stage IA (T1,N0)  PROCEDURE:  Procedure(s) with comments: LOBECTOMY, LUNG, ROBOT-ASSISTED, USING VATS (Left) - Robotic left lower lobectomy BLOCK, NERVE, INTERCOSTAL (Left) BIOPSY, LYMPH NODE (Left)  SURGEON:  Surgeons and Role:    * Kerrin Elspeth BROCKS, MD - Primary  PHYSICIAN ASSISTANT: WAYNE GOLD PA-C  ASSISTANTS: none   ANESTHESIA:   local, general, and INTERCOSTAL NERVE BLOCK  EBL:  25 mL   BLOOD ADMINISTERED:none  DRAINS: 1 28 F Chest Tube(s) in the LEFT HEMITHORAX   LOCAL MEDICATIONS USED:  BUPIVICAINE  and OTHER EXPAREL  SPECIMEN:  Source of Specimen:  LEFT LOWER LOBE AND MULTIPLE LN SAMPLES  DISPOSITION OF SPECIMEN:  PATHOLOGY  COUNTS:  YES  TOURNIQUET:  * No tourniquets in log *  DICTATION: .Other Dictation: Dictation Number PENDING  PLAN OF CARE: Admit to inpatient   PATIENT DISPOSITION:  PACU - hemodynamically stable.   Delay start of Pharmacological VTE agent (>24hrs) due to surgical blood loss or risk of bleeding: no  COMPLICATIONS: NO KNOWN

## 2024-03-31 NOTE — Interval H&P Note (Signed)
 History and Physical Interval Note:  03/31/2024 7:15 AM  Jesse [name]  has presented today for surgery, with the diagnosis of Atypical carcinoid left lower lobe.  The various methods of treatment have been discussed with the patient and family. After consideration of risks, benefits and other options for treatment, the patient has consented to  Procedure(s) with comments: LOBECTOMY, LUNG, ROBOT-ASSISTED, USING VATS (Left) - Robotic left lower lobectomy as a surgical intervention.  The patient's history has been reviewed, patient examined, no change in status, stable for surgery.  I have reviewed the patient's chart and labs.  Questions were answered to the patient's satisfaction.     [name]

## 2024-04-01 ENCOUNTER — Inpatient Hospital Stay (HOSPITAL_COMMUNITY)

## 2024-04-01 ENCOUNTER — Encounter (HOSPITAL_COMMUNITY): Payer: Self-pay | Admitting: Thoracic Surgery (Cardiothoracic Vascular Surgery)

## 2024-04-01 DIAGNOSIS — Z48813 Encounter for surgical aftercare following surgery on the respiratory system: Secondary | ICD-10-CM | POA: Diagnosis not present

## 2024-04-01 DIAGNOSIS — J939 Pneumothorax, unspecified: Secondary | ICD-10-CM | POA: Diagnosis not present

## 2024-04-01 DIAGNOSIS — J9811 Atelectasis: Secondary | ICD-10-CM | POA: Diagnosis not present

## 2024-04-01 DIAGNOSIS — Z4682 Encounter for fitting and adjustment of non-vascular catheter: Secondary | ICD-10-CM | POA: Diagnosis not present

## 2024-04-01 DIAGNOSIS — R0989 Other specified symptoms and signs involving the circulatory and respiratory systems: Secondary | ICD-10-CM | POA: Diagnosis not present

## 2024-04-01 LAB — CBC
HCT: 36.2 % — ABNORMAL LOW (ref 39.0–52.0)
Hemoglobin: 12.7 g/dL — ABNORMAL LOW (ref 13.0–17.0)
MCH: 29.7 pg (ref 26.0–34.0)
MCHC: 35.1 g/dL (ref 30.0–36.0)
MCV: 84.6 fL (ref 80.0–100.0)
Platelets: 194 K/uL (ref 150–400)
RBC: 4.28 MIL/uL (ref 4.22–5.81)
RDW: 13.1 % (ref 11.5–15.5)
WBC: 14.3 K/uL — ABNORMAL HIGH (ref 4.0–10.5)
nRBC: 0 % (ref 0.0–0.2)

## 2024-04-01 LAB — BASIC METABOLIC PANEL WITH GFR
Anion gap: 10 (ref 5–15)
BUN: 10 mg/dL (ref 8–23)
CO2: 20 mmol/L — ABNORMAL LOW (ref 22–32)
Calcium: 8.7 mg/dL — ABNORMAL LOW (ref 8.9–10.3)
Chloride: 103 mmol/L (ref 98–111)
Creatinine, Ser: 1 mg/dL (ref 0.61–1.24)
GFR, Estimated: >60 mL/min (ref >60)
Glucose, Bld: 126 mg/dL — ABNORMAL HIGH (ref 70–99)
Potassium: 4.1 mmol/L (ref 3.5–5.1)
Sodium: 133 mmol/L — ABNORMAL LOW (ref 135–145)

## 2024-04-01 NOTE — Plan of Care (Signed)
   Problem: Health Behavior/Discharge Planning: Goal: Ability to manage health-related needs will improve Outcome: Progressing   Problem: Clinical Measurements: Goal: Ability to maintain clinical measurements within normal limits will improve Outcome: Progressing   Problem: Clinical Measurements: Goal: Will remain free from infection Outcome: Progressing

## 2024-04-01 NOTE — Progress Notes (Addendum)
      [address] [employer] [address]             [phone]      1 Day Post-Op Procedure(s) (LRB): LOBECTOMY, LUNG, ROBOT-ASSISTED, USING VATS (Left) BLOCK, NERVE, INTERCOSTAL (Left) BIOPSY, LYMPH NODE (Left) Subjective: Patient reports he has minimal pain and only got a little winded on his walk. Sleeping in the chair upon my arrival  Objective: Vital signs in last 24 hours: Temp:  [97.6 F (36.4 C)-98.3 F (36.8 C)] 98 F (36.7 C) (11/08 0744) Pulse Rate:  [54-79] 64 (11/08 0744) Cardiac Rhythm: Sinus bradycardia (11/08 0729) Resp:  [11-20] 18 (11/08 0744) BP: (105-150)/(77-106) 110/77 (11/08 0744) SpO2:  [95 %-100 %] 100 % (11/08 0744) Arterial Line BP: (108-136)/(63-81) 126/73 (11/07 1330)  Hemodynamic parameters for last 24 hours:    Intake/Output from previous day: 11/07 0701 - 11/08 0700 In: 3619.8 [P.O.:320; I.V.:2499.8; IV Piggyback:800] Out: 3439 [Urine:3350; Blood:25; Chest Tube:64] Intake/Output this shift: Total I/O In: 200 [P.O.:200] Out: 250 [Urine:200; Chest Tube:50]  General appearance: alert, cooperative, and no distress Neurologic: intact Heart: sinus bradycardia, no murmur Lungs: clear to auscultation bilaterally Abdomen: soft, non-tender; bowel sounds normal; no masses,  no organomegaly Extremities: extremities normal, atraumatic, no cyanosis or edema Wound: Clean and dry dressing in place, no sign of infection  Lab Results: Recent Labs    03/29/24 1030 04/01/24 0332  WBC 5.5 14.3*  HGB 14.8 12.7*  HCT 44.4 36.2*  PLT 202 194   BMET:  Recent Labs    03/29/24 1030 04/01/24 0332  NA 139 133*  K 3.8 4.1  CL 103 103  CO2 25 20*  GLUCOSE 84 126*  BUN 10 10  CREATININE 1.03 1.00  CALCIUM 9.1 8.7*    PT/INR:  Recent Labs    03/29/24 1030  LABPROT 12.6  INR 0.9   ABG    Component Value Date/Time   HCO3 20.6 09/14/2023 2113   ACIDBASEDEF 5.4 (H) 09/14/2023 2113   CBG (last 3)  No results for  input(s): GLUCAP in the last 72 hours.  Assessment/Plan: S/P Procedure(s) (LRB): LOBECTOMY, LUNG, ROBOT-ASSISTED, USING VATS (Left) BLOCK, NERVE, INTERCOSTAL (Left) BIOPSY, LYMPH NODE (Left)  Neuro: Home propranolol  for tremor restarted  CV: BP controlled. SB-NSR, HR 50-60s. Restarted on home norvasc   Pulm: Saturating well on RA this AM. CT output 64cc/24hrs. CXR without clear pneumothorax pending final read. CT to waterseal. CT with tidaling but no air leak. Encourage IS and ambulation.   Expected postop ABLA: H/H 12.7/36.2, not clinically significant at this time.   ID: Likely reactive leukocytosis, afebrile. Will clinically monitor.   Renal: Cr 1.0 stable. UO 3350cc/24hrs.   DVT Prophylaxis: Lovenox   Dispo: Can likely d/c CT today. Hopefully can d/c home tomorrow.    LOS: 1 day    Con GORMAN Bend, PA-C 04/01/2024 Patient seen and examined, agree with above Dc chest tube Looks great  Elspeth C. Kerrin, MD [facility] [phone]

## 2024-04-02 ENCOUNTER — Other Ambulatory Visit (HOSPITAL_COMMUNITY): Payer: Self-pay

## 2024-04-02 ENCOUNTER — Inpatient Hospital Stay (HOSPITAL_COMMUNITY)

## 2024-04-02 LAB — COMPREHENSIVE METABOLIC PANEL WITH GFR
ALT: 12 U/L (ref 0–44)
AST: 24 U/L (ref 15–41)
Albumin: 3.3 g/dL — ABNORMAL LOW (ref 3.5–5.0)
Alkaline Phosphatase: 43 U/L (ref 38–126)
Anion gap: 10 (ref 5–15)
BUN: 14 mg/dL (ref 8–23)
CO2: 22 mmol/L (ref 22–32)
Calcium: 8.8 mg/dL — ABNORMAL LOW (ref 8.9–10.3)
Chloride: 105 mmol/L (ref 98–111)
Creatinine, Ser: 1.01 mg/dL (ref 0.61–1.24)
GFR, Estimated: >60 mL/min (ref >60)
Glucose, Bld: 93 mg/dL (ref 70–99)
Potassium: 4 mmol/L (ref 3.5–5.1)
Sodium: 137 mmol/L (ref 135–145)
Total Bilirubin: 0.4 mg/dL (ref 0.0–1.2)
Total Protein: 6.4 g/dL — ABNORMAL LOW (ref 6.5–8.1)

## 2024-04-02 LAB — CBC
HCT: 37.9 % — ABNORMAL LOW (ref 39.0–52.0)
Hemoglobin: 13 g/dL (ref 13.0–17.0)
MCH: 29.5 pg (ref 26.0–34.0)
MCHC: 34.3 g/dL (ref 30.0–36.0)
MCV: 86.1 fL (ref 80.0–100.0)
Platelets: 211 K/uL (ref 150–400)
RBC: 4.4 MIL/uL (ref 4.22–5.81)
RDW: 13.5 % (ref 11.5–15.5)
WBC: 13.7 K/uL — ABNORMAL HIGH (ref 4.0–10.5)
nRBC: 0 % (ref 0.0–0.2)

## 2024-04-02 MED ORDER — GABAPENTIN 300 MG PO CAPS
300.0000 mg | ORAL_CAPSULE | Freq: Every day | ORAL | 0 refills | Status: DC
  Filled 2024-04-02: qty 30, 30d supply, fill #0

## 2024-04-02 MED ORDER — ACETAMINOPHEN 325 MG PO TABS: 650.0000 mg | ORAL_TABLET | Freq: Four times a day (QID) | ORAL | Status: AC | PRN

## 2024-04-02 MED ORDER — TRAMADOL HCL 50 MG PO TABS
50.0000 mg | ORAL_TABLET | Freq: Four times a day (QID) | ORAL | 0 refills | Status: DC | PRN
  Filled 2024-04-02: qty 28, 7d supply, fill #0

## 2024-04-02 MED ORDER — NICOTINE 21 MG/24HR TD PT24
21.0000 mg | MEDICATED_PATCH | Freq: Every day | TRANSDERMAL | 0 refills | Status: DC
  Filled 2024-04-02: qty 90, 90d supply, fill #0

## 2024-04-02 NOTE — Discharge Instructions (Signed)
 Discharge Instructions:  1. You may shower, please wash incisions daily with soap and water and keep dry.  If you wish to cover wounds with dressing you may do so but please keep clean and change daily.  No tub baths or swimming until incisions have completely healed.  If your incisions become red or develop any drainage please call our office at [phone]  2. No Driving until cleared by Dr. Chrystal office and you are no longer using narcotic pain medications  3. You may remove dressings once you arrive home  4. Fever of 101.5 for at least 24 hours with no source, please contact our office at [phone]  5. Activity- up as tolerated, please walk at least 3 times per day.  Avoid strenuous activity until cleared by Dr. Chrystal office  6. If any questions or concerns arise, please do not hesitate to contact our office at [phone]

## 2024-04-02 NOTE — Progress Notes (Addendum)
      [address] [employer] [address]             [phone]      2 Days Post-Op Procedure(s) (LRB): LOBECTOMY, LUNG, ROBOT-ASSISTED, USING VATS (Left) BLOCK, NERVE, INTERCOSTAL (Left) BIOPSY, LYMPH NODE (Left) Subjective: Patient reports he had some pain in his shoulder blade but this has resolved. He states he is walking well.   Objective: Vital signs in last 24 hours: Temp:  [97.5 F (36.4 C)-98.7 F (37.1 C)] 98.3 F (36.8 C) (11/09 0736) Pulse Rate:  [55-62] 62 (11/09 0736) Cardiac Rhythm: Normal sinus rhythm (11/09 0701) Resp:  [16-17] 17 (11/09 0736) BP: (108-129)/(75-89) 114/83 (11/09 0736) SpO2:  [93 %-100 %] 96 % (11/09 0736)  Hemodynamic parameters for last 24 hours:    Intake/Output from previous day: 11/08 0701 - 11/09 0700 In: 852.4 [P.O.:680; I.V.:172.4] Out: 2670 [Urine:2600; Chest Tube:70] Intake/Output this shift: Total I/O In: 120 [P.O.:120] Out: -   General appearance: alert, cooperative, and no distress Neurologic: intact Heart: SB-NSR Lungs: clear to auscultation bilaterally Abdomen: soft, non-tender; bowel sounds normal; no masses,  no organomegaly Extremities: extremities normal, atraumatic, no cyanosis or edema Wound: Clean and dry without sign of infection  Lab Results: Recent Labs    04/01/24 0332 04/02/24 0346  WBC 14.3* 13.7*  HGB 12.7* 13.0  HCT 36.2* 37.9*  PLT 194 211   BMET:  Recent Labs    04/01/24 0332 04/02/24 0346  NA 133* 137  K 4.1 4.0  CL 103 105  CO2 20* 22  GLUCOSE 126* 93  BUN 10 14  CREATININE 1.00 1.01  CALCIUM 8.7* 8.8*    PT/INR: No results for input(s): LABPROT, INR in the last 72 hours. ABG    Component Value Date/Time   HCO3 20.6 09/14/2023 2113   ACIDBASEDEF 5.4 (H) 09/14/2023 2113   CBG (last 3)  No results for input(s): GLUCAP in the last 72 hours.  Assessment/Plan: S/P Procedure(s) (LRB): LOBECTOMY, LUNG, ROBOT-ASSISTED, USING VATS (Left) BLOCK,  NERVE, INTERCOSTAL (Left) BIOPSY, LYMPH NODE (Left)  Neuro: Home propranolol  for tremor restarted   CV: BP controlled. SB-NSR, HR 50-60s. Restarted on home norvasc    Pulm: Saturating well on RA this AM. CT removed yesterday. CXR without clear pneumothorax pending final read. Encourage IS and ambulation.    Expected postop ABLA: H/H 12.7/36.2, not clinically significant at this time.    ID: Likely reactive leukocytosis improving, afebrile. Will clinically monitor.    Renal: Cr 1.01 stable. UO 2600cc/24hrs.    DVT Prophylaxis: Lovenox    Dispo: Plan to d/c home today   LOS: 2 days    Con GORMAN [place], PA-C 04/02/2024 Looks great Dc home today F/u in office 11/25  Elspeth C. Kerrin, MD [facility] [phone]

## 2024-04-03 ENCOUNTER — Other Ambulatory Visit: Payer: Self-pay

## 2024-04-03 NOTE — Patient Outreach (Signed)
 Complex Care Management   Visit Note  04/03/2024  Name:  Jesse [name] MRN: [id number] DOB: 09-29-1955  Situation: Referral received for Complex Care Management related to Current lung cancer, SDOH. I obtained verbal consent from Patient.  Visit completed with Jesse [name]  on the phone.  Had scheduled Lobectomy 03/31/24, recovering, denies symptoms, surgeon office had checked in on him, he denies concerns. He was looking for food/financial resources, BSW sent resources by email after 03/08/24 contact. This RNCM provided transportation resource through [employer] after 03/16/24 contact.  Background:   Past Medical History:  Diagnosis Date   Anxiety    Arthritis    Coronary artery disease    Depression 02/15/2014   Hypertension    Myocardial infarction [facility])    NSTEMI   PE (pulmonary thromboembolism) (HCC) 09/09/2015   Pre-diabetes    Pulmonary embolism (HCC)    Sleep disturbance 02/15/2014   Tremors of nervous system    Trochanteric bursitis of left hip 08/30/2018    Assessment: Patient Reported Symptoms:  Cognitive Cognitive Status: Struggling with memory recall, Alert and oriented to person, place, and time, Insightful and able to interpret abstract concepts, Normal speech and language skills      Neurological Neurological Review of Symptoms: No symptoms reported    HEENT   HEENT Comment: No acute tooth pain at this time but he does need multiple teeth pulled for partial plate/dentures, he is currently exploring options for this, seeing dentists for best pricing. We discussed [employer] credit card that sometimes has deferred interest, he will check into it.    Cardiovascular Cardiovascular Symptoms Reported: No symptoms reported    Respiratory Respiratory Symptoms Reported: Other: Additional Respiratory Details: Had lobectomy on 03/31/24, he is back at home, no concerning symptoms, surgeon office has called to check in on him. [name] aware to call  surgeon or go to ED for sudden shortness of breath, pain.    Endocrine Endocrine Symptoms Reported: Not assessed Is patient diabetic?: No    Gastrointestinal Gastrointestinal Symptoms Reported: Not assessed      Genitourinary Genitourinary Symptoms Reported: Not assessed    Integumentary Integumentary Symptoms Reported: Not assessed    Musculoskeletal Musculoskelatal Symptoms Reviewed: Not assessed        Psychosocial Psychosocial Symptoms Reported: Not assessed          04/03/2024    PHQ2-9 Depression Screening   Little interest or pleasure in doing things    Feeling down, depressed, or hopeless    PHQ-2 - Total Score    Trouble falling or staying asleep, or sleeping too much    Feeling tired or having little energy    Poor appetite or overeating     Feeling bad about yourself - or that you are a failure or have let yourself or your family down    Trouble concentrating on things, such as reading the newspaper or watching television    Moving or speaking so slowly that other people could have noticed.  Or the opposite - being so fidgety or restless that you have been moving around a lot more than usual    Thoughts that you would be better off dead, or hurting yourself in some way    PHQ2-9 Total Score    If you checked off any problems, how difficult have these problems made it for you to do your work, take care of things at home, or get along with other people    Depression Interventions/Treatment  There were no vitals filed for this visit.  Medications Reviewed Today   Medications were not reviewed in this encounter     Recommendation:   Continue Current Plan of Care Patient will complete transportation application with [facility] when he receives in the mail for future needs. He will follow up on food resources.   Follow Up Plan:   Telephone follow-up in 1 month  Santana Stamp BSN, CCM [place]  [facility] Population Health RN Care  Manager Direct Dial: [phone]  Fax: [phone]

## 2024-04-03 NOTE — Patient Instructions (Signed)
 Visit Information  Thank you for taking time to visit with me today. Please don't hesitate to contact me if I can be of assistance to you before our next scheduled appointment.  Your next care management appointment is by telephone on Monday, December 8th at 2:00pm   Please call the care guide team at [phone] if you need to cancel, schedule, or reschedule an appointment.   A reminder to ALL patients/family/friends, please call the [place]  National Suicide Prevention Lifeline: 269-079-4060 or TTY: 803-174-6579 TTY 862-545-5596) to talk to a trained counselor if you are experiencing a Mental Health or Behavioral Health Crisis or need someone to talk to.  Santana Stamp BSN, CCM [place]  [facility] Population Health RN Care Manager Direct Dial: [phone]  Fax: [phone]

## 2024-04-07 LAB — SURGICAL PATHOLOGY

## 2024-04-10 ENCOUNTER — Other Ambulatory Visit: Payer: Self-pay

## 2024-04-10 NOTE — Patient Instructions (Signed)
 Visit Information  Thank you for taking time to visit with me today. Please don't hesitate to contact me if I can be of assistance to you before our next scheduled appointment.  Your next care management appointment is by telephone on 05/01/24 at 10:30   Please call the care guide team at [phone] if you need to cancel, schedule, or reschedule an appointment.   Please call the Suicide and Crisis Lifeline: 988 call the [place]  National Suicide Prevention Lifeline: (667) 711-6528 or TTY: (304) 791-3945 TTY 6395588139) to talk to a trained counselor call 1-800-273-TALK (toll free, 24 hour hotline) call 911 if you are experiencing a Mental Health or Behavioral Health Crisis or need someone to talk to.  Thersia Hoar, HEDWIG, MHA [place]  [facility] Social Worker, Population Health [phone]

## 2024-04-10 NOTE — Patient Outreach (Signed)
 Social Drivers of Health  Community Resource and Care Coordination Visit Note   04/10/2024  Name: Jesse [name] MRN: [id number] DOB:November 26, 1955  Situation: Referral received for [facility] needs assessment and assistance related to Financial Strain  Food Insecurity . I obtained verbal consent from Patient.  Visit completed with Patient on the phone.   Background:      Assessment:   Goals Addressed   None     Recommendation:   Patient will use resources sw sent for assistance  Follow Up Plan:   Telephone follow-up 05/01/24 at 10:30am  Thersia Hoar, BSW, MHA [place]  [facility] Social Worker, Population Health [phone]

## 2024-04-12 ENCOUNTER — Ambulatory Visit (INDEPENDENT_AMBULATORY_CARE_PROVIDER_SITE_OTHER)

## 2024-04-12 VITALS — BP 100/68 | HR 86 | Ht 71.0 in | Wt 195.6 lb

## 2024-04-12 DIAGNOSIS — C7A090 Malignant carcinoid tumor of the bronchus and lung: Secondary | ICD-10-CM

## 2024-04-12 DIAGNOSIS — F17200 Nicotine dependence, unspecified, uncomplicated: Secondary | ICD-10-CM

## 2024-04-12 DIAGNOSIS — Z902 Acquired absence of lung [part of]: Secondary | ICD-10-CM

## 2024-04-12 DIAGNOSIS — F17210 Nicotine dependence, cigarettes, uncomplicated: Secondary | ICD-10-CM | POA: Diagnosis not present

## 2024-04-12 MED ORDER — NICOTINE 21 MG/24HR TD PT24: 21.0000 mg | MEDICATED_PATCH | Freq: Every day | TRANSDERMAL | 0 refills | Status: AC

## 2024-04-12 NOTE — Progress Notes (Signed)
 Progress Note  Physician: Farrel Guimond A Izel Eisenhardt, MD   HPI: Jesse [name] is a 68 y.o. male presenting on 04/12/2024 for Establish Care .  Discussed the use of AI scribe software for clinical note transcription with the patient, who gave verbal consent to proceed.  History of Present Illness   Jesse [name] is a 68 year old male who presents for follow-up after a robot-assisted lobectomy of the left lung for carcinoid tumor  Postoperative status following lobectomy - Hospitalized from Friday to Sunday for robot-assisted lobectomy - No postoperative complications - Able to ambulate the day after surgery - Initial pain present, currently no pain - Pain management effective with tramadol as needed - No fever, chills, lightheadedness, or dizziness  Nicotine  use and smoking cessation - Using nicotine  patch for two and a half weeks - Significant reduction in cigarette consumption; only two cigarettes smoked in the past week - Adjusting daily habits to avoid smoking triggers, such as avoiding smoking with morning coffee  General well-being and nutrition - Eating well - Maintaining weight      Medical history:  Relevant past medical, surgical, family and social history reviewed and updated as indicated. Interim medical history since our last visit reviewed.  Allergies and medications reviewed and updated.   ROS: Negative unless specifically indicated above in HPI.    Current Outpatient Medications:    acetaminophen  (TYLENOL ) 325 MG tablet, Take 2 tablets (650 mg total) by mouth every 6 (six) hours as needed for mild pain (pain score 1-3)., Disp: , Rfl:    propranolol  ER (INDERAL  LA) 60 MG 24 hr capsule, Take 1 capsule (60 mg total) by mouth daily. For tremor, Disp: 30 capsule, Rfl: 1   rosuvastatin (CRESTOR) 40 MG tablet, Take 1 tablet (40 mg total) by mouth daily., Disp: 90 tablet, Rfl: 3   traMADol (ULTRAM) 50 MG tablet, Take 1 tablet (50 mg total) by mouth  every 6 (six) hours as needed for severe pain (pain score 7-10)., Disp: 28 tablet, Rfl: 0   nicotine  (NICODERM CQ  - DOSED IN MG/24 HOURS) 21 mg/24hr patch, Place 1 patch (21 mg total) onto the skin daily., Disp: 90 patch, Rfl: 0       Objective:     BP 100/68   Pulse 86   Ht 5' 11 (1.803 m)   Wt 195 lb 9.6 oz (88.7 kg)   SpO2 99%   BMI 27.28 kg/m   Wt Readings from Last 3 Encounters:  04/12/24 195 lb 9.6 oz (88.7 kg)  03/31/24 197 lb (89.4 kg)  03/29/24 197 lb (89.4 kg)    Physical Exam  Physical Exam Vitals reviewed.  Constitutional:      Appearance: Normal appearance. Well-developed with normal weight.  Cardiovascular:     Rate and Rhythm: Normal rate and regular rhythm. Normal heart sounds. Normal peripheral pulses Pulmonary:     Normal breath sounds with normal effort Skin:    General: Skin is warm and dry without noticeable rash. Neurological:     General: No focal deficit present.  Psychiatric:        Mood and Affect: Mood, behavior and cognition normal      Assessment & Plan:   Encounter Diagnoses  Name Primary?   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 Pain managed with tramadol. Stable vitals. CT scan planned for post-operative evaluation. - Continue tramadol as needed. - Follow up with surgeon on November 29th  - Review blood work before Christmas. - Schedule CT scan for lung evaluation.  Nicotine  dependence Using 21 mg nicotine  patch. Reduced smoking. Plans to decrease patch strength after January 1st. Discussed habit adjustments for cessation support. - Renewed 21 mg nicotine  patch prescription. - Plan to reduce patch  strength after January 1st to 14 mg - Encouraged habit adjustments for smoking cessation.

## 2024-04-13 ENCOUNTER — Other Ambulatory Visit: Payer: Self-pay

## 2024-04-13 NOTE — Progress Notes (Signed)
 The proposed treatment discussed in conference is for discussion purpose only and is not a binding recommendation.  The patients have not been physically examined, or presented with their treatment options.  Therefore, final treatment plans cannot be decided.

## 2024-04-14 ENCOUNTER — Encounter (INDEPENDENT_AMBULATORY_CARE_PROVIDER_SITE_OTHER): Payer: Self-pay

## 2024-04-14 ENCOUNTER — Telehealth (HOSPITAL_COMMUNITY): Payer: Self-pay | Admitting: *Deleted

## 2024-04-14 ENCOUNTER — Ambulatory Visit (INDEPENDENT_AMBULATORY_CARE_PROVIDER_SITE_OTHER)

## 2024-04-14 VITALS — BP 106/73 | HR 88 | Temp 98.4°F | Ht 71.0 in | Wt 197.0 lb

## 2024-04-14 DIAGNOSIS — J343 Hypertrophy of nasal turbinates: Secondary | ICD-10-CM | POA: Diagnosis not present

## 2024-04-14 DIAGNOSIS — R1312 Dysphagia, oropharyngeal phase: Secondary | ICD-10-CM | POA: Diagnosis not present

## 2024-04-14 DIAGNOSIS — K219 Gastro-esophageal reflux disease without esophagitis: Secondary | ICD-10-CM

## 2024-04-14 DIAGNOSIS — F17210 Nicotine dependence, cigarettes, uncomplicated: Secondary | ICD-10-CM

## 2024-04-14 DIAGNOSIS — J342 Deviated nasal septum: Secondary | ICD-10-CM

## 2024-04-14 DIAGNOSIS — F17200 Nicotine dependence, unspecified, uncomplicated: Secondary | ICD-10-CM

## 2024-04-14 DIAGNOSIS — J392 Other diseases of pharynx: Secondary | ICD-10-CM | POA: Diagnosis not present

## 2024-04-14 DIAGNOSIS — R490 Dysphonia: Secondary | ICD-10-CM | POA: Diagnosis not present

## 2024-04-14 MED ORDER — OMEPRAZOLE 20 MG PO CPDR: 20.0000 mg | DELAYED_RELEASE_CAPSULE | Freq: Every day | ORAL | 3 refills | Status: AC

## 2024-04-14 NOTE — Telephone Encounter (Signed)
 Attempted to contact patient to schedule OP MBS. Left VM @ [phone] requesting a call back. RKEEL

## 2024-04-14 NOTE — Progress Notes (Unsigned)
 Dear Dr. Zafirov, Here is my assessment for our mutual patient, Jesse [name]. Thank you for allowing me the opportunity to care for your patient. Please do not hesitate to contact me should you have any other questions. Sincerely, Dr. Penne Croak  Otolaryngology Clinic Note Referring provider: Dr. Everlene HPI:  Discussed the use of AI scribe software for clinical note transcription with the patient, who gave verbal consent to proceed.  History of Present Illness Jesse [name] is a 68 year old male who presents with difficulty swallowing and a nasal mass for evaluation.  Dysphagia and oropharyngeal symptoms - Difficulty swallowing for approximately five years, described as globus and dysphagia - Symptoms are worse in the mornings - Sensation of water feeling stuck when swallowing, sometimes requiring regurgitation - Sensation is higher up in the throat and associated with pain when pushing liquid down - No coughing with meals - Cough present when laughing - Gurgling sensation when lying down, sometimes with phlegm production - No prior swallow study or swallow therapy - No current or prior use of acid reflux medication  Nasal mass - PET scan in September identified a nasal mass  Otologic symptoms - History of ear wax buildup - Occasional ear bleeding associated with Q-tip use  Nasal obstruction - History of deviated septum - Breathes well through nose despite septal deviation  Tobacco use - Continues to smoke, currently about one cigarette per day - Nicotine  patch use to aid reduction - Began smoking at age 68 or 39 after stopping regular exercise  Surgical history - History of lung surgery   Independent Review of Additional Tests or Records:  Reviewed external note from referring PCP, Zafirov,describing relevant history incorporated into today's evaluation. I personally reviewed and interpreted PET CT. - small posterior nasopharynx FDG uptake near midline - ovid  update.  PMH/Meds/All/SocHx/FamHx/ROS:   Past Medical History:  Diagnosis Date   Anxiety    Arthritis    Coronary artery disease    Depression 02/15/2014   Hypertension    Myocardial infarction [facility])    NSTEMI   PE (pulmonary thromboembolism) (HCC) 09/09/2015   Pre-diabetes    Pulmonary embolism (HCC)    Sleep disturbance 02/15/2014   Tremors of nervous system    Trochanteric bursitis of left hip 08/30/2018     Past Surgical History:  Procedure Laterality Date   BRONCHIAL NEEDLE ASPIRATION BIOPSY  03/06/2024   Procedure: BRONCHOSCOPY, WITH NEEDLE ASPIRATION BIOPSY;  Surgeon: Shelah Lamar RAMAN, MD;  Location: [facility] ENDOSCOPY;  Service: Pulmonary;;   CRYOTHERAPY  03/06/2024   Procedure: CRYOTHERAPY;  Surgeon: Shelah Lamar RAMAN, MD;  Location: [facility] ENDOSCOPY;  Service: Pulmonary;;   ENDOBRONCHIAL ULTRASOUND Left 03/06/2024   Procedure: ENDOBRONCHIAL ULTRASOUND (EBUS);  Surgeon: Shelah Lamar RAMAN, MD;  Location: [facility] ENDOSCOPY;  Service: Pulmonary;  Laterality: Left;   FRACTURE SURGERY Right    pin placed on right leg   FUDUCIAL PLACEMENT  03/06/2024   Procedure: INSERTION, FIDUCIAL MARKER, GOLD;  Surgeon: Shelah Lamar RAMAN, MD;  Location: [facility] ENDOSCOPY;  Service: Pulmonary;;   INTERCOSTAL NERVE BLOCK Left 03/31/2024   Procedure: BLOCK, NERVE, INTERCOSTAL;  Surgeon: Kerrin Elspeth BROCKS, MD;  Location: [facility] OR;  Service: Thoracic;  Laterality: Left;   LOBECTOMY, LUNG, ROBOT-ASSISTED, USING VATS Left 03/31/2024   Procedure: LOBECTOMY, LUNG, ROBOT-ASSISTED, USING VATS;  Surgeon: Kerrin Elspeth BROCKS, MD;  Location: [facility] OR;  Service: Thoracic;  Laterality: Left;  Robotic left lower lobectomy   SCALENE NODE BIOPSY Left 03/31/2024   Procedure: BIOPSY, LYMPH NODE;  Surgeon: Kerrin Elspeth BROCKS, MD;  Location[facility] OR;  Service: Thoracic;  Laterality: Left;   TOTAL HIP ARTHROPLASTY Bilateral    VIDEO BRONCHOSCOPY WITH ENDOBRONCHIAL NAVIGATION Left 03/06/2024   Procedure: VIDEO BRONCHOSCOPY WITH  ENDOBRONCHIAL NAVIGATION;  Surgeon: Shelah Lamar RAMAN, MD;  Location: [facility] ENDOSCOPY;  Service: Pulmonary;  Laterality: Left;  Left lower lobe    Family History  Problem Relation Age of Onset   Diabetes Mother    Alzheimer's disease Mother    Gout Mother    Hyperlipidemia Mother    Hypertension Mother    Diabetes Father    Coronary artery disease Father 32       stent   Heart disease Father    Hypertension Father    Healthy Sister    Healthy Sister    Healthy Sister    Lung disease Brother    Healthy Brother    Healthy Brother    Healthy Daughter    Healthy Son    Breast cancer Neg Hx      Social Connections: Socially Isolated (03/31/2024)   Social Connection and Isolation Panel    Frequency of Communication with Friends and Family: Once a week    Frequency of Social Gatherings with Friends and Family: Never    Attends Religious Services: Never    Database Administrator or Organizations: No    Attends Engineer, Structural: Never    Marital Status: Divorced      Current Outpatient Medications:    acetaminophen  (TYLENOL ) 325 MG tablet, Take 2 tablets (650 mg total) by mouth every 6 (six) hours as needed for mild pain (pain score 1-3)., Disp: , Rfl:    nicotine  (NICODERM CQ  - DOSED IN MG/24 HOURS) 21 mg/24hr patch, Place 1 patch (21 mg total) onto the skin daily., Disp: 90 patch, Rfl: 0   omeprazole  (PRILOSEC) 20 MG capsule, Take 1 capsule (20 mg total) by mouth daily before breakfast., Disp: 30 capsule, Rfl: 3   propranolol  ER (INDERAL  LA) 60 MG 24 hr capsule, Take 1 capsule (60 mg total) by mouth daily. For tremor, Disp: 30 capsule, Rfl: 1   rosuvastatin  (CRESTOR ) 40 MG tablet, Take 1 tablet (40 mg total) by mouth daily., Disp: 90 tablet, Rfl: 3   traMADol  (ULTRAM ) 50 MG tablet, Take 1 tablet (50 mg total) by mouth every 6 (six) hours as needed for severe pain (pain score 7-10)., Disp: 28 tablet, Rfl: 0   Physical Exam:   BP 106/73 (BP Location: Right Arm, Patient  Position: Sitting, Cuff Size: Normal)   Pulse 88   Temp 98.4 F (36.9 C) (Oral)   Ht 5' 11 (1.803 m)   Wt 197 lb (89.4 kg)   SpO2 97%   BMI 27.48 kg/m   The patient was awake, alert, and appropriate. The external ears were inspected, and otoscopy was performed to evaluate the external auditory canals and tympanic membranes. The nasal cavity and septum were examined for mucosal changes, obstruction, or discharge. The oral cavity and oropharynx were inspected for mucosal lesions, infection, or tonsillar hypertrophy. The neck was palpated for lymphadenopathy, thyroid abnormalities, or other masses. Cranial nerve function was grossly intact.  Pertinent Findings: Physical Exam HEENT: Left EAC with abrasion along inferior canal. Nasal septum deviated slightly to the right. Septum crooked but nasal breathing adequate. Missing teeth noted. Neck without lymphadenopathy, full ROM. No lesions on external ear or oral cavity.   Seprately Identifiable Procedures:  I personally ordered, reviewed and interpreted the following  with the patient today  Given the patient's symptoms and incomplete visualization of critical sinonasal areas with anterior rhinoscopy, a separately performed diagnostic nasal endoscopy procedure is indicated for a complete rhinologic evaluation per American Rhinologic Society recommendations (https://[email or web address]-rhinologic.org/position-statements)  I personally ordered, reviewed and interpreted the following with the patient today  Procedure Note Diagnostic Nasal Endoscopy CPT CODE -- 68768 - Mod 25  Prior to initiating any procedures, risks/benefits/alternatives were explained to the patient and verbal consent obtained.  Pre-procedure diagnosis: Concern for lesion, R/o malignancy Post-procedure diagnosis: same Indication: See pre-procedure diagnosis and physical exam above Complications: None apparent EBL: 0 mL Anesthesia: Lidocaine  4% and topical decongestant was  topically sprayed in each nasal cavity  Description of Procedure:  Patient was identified. A flexible fiberoptic endoscope was utilized to evaluate the sinonasal cavities, mucosa, sinus ostia and turbinates and septum.  Overall, signs of mucosal inflammation are noted.  Also noted are posterior nasopharynx with midline lesion. Mild DNS, inferior turbinate hypertrophy with 2+.  No mucopurulence, polyps, or masses noted.   Right Middle meatus: clear Right SE Recess: clear Left MM: clear Left SE Recess: clear Photodocumentation was obtained.   Procedure Note Pre-procedure diagnosis:  globus Post-procedure diagnosis: Same Procedure: Transnasal Fiberoptic Laryngoscopy, CPT 31575 - Mod 25 Indication: globus sensation Complications: None apparent EBL: 0 mL  The procedure was undertaken to further evaluate the patient's complaint of globus sensation and dysphagia, with mirror exam inadequate for appropriate examination due to gag reflex and poor patient tolerance  Procedure:  Patient was identified as correct patient. Verbal consent was obtained. The nose was sprayed with oxymetazoline and 4% lidocaine . The The flexible laryngoscope was passed through the nose to view the nasal cavity, pharynx (oropharynx, hypopharynx) and larynx.  The larynx was examined at rest and during multiple phonatory tasks. Documentation was obtained and reviewed with patient. The scope was removed. The patient tolerated the procedure well.  Findings: The nasal cavity and nasopharynx did not reveal any masses or lesions, mucosa appeared to be without obvious lesions. The tongue base, pharyngeal walls, piriform sinuses, vallecula, epiglottis and postcricoid region are normal in appearance EXCEPT: mild interarytenoid erythema. The visualized portion of the subglottis and proximal trachea is widely patent. The vocal folds are mobile bilaterally. There are no lesions on the free edge of the vocal folds nor elsewhere in the larynx  worrisome for malignancy.    Electronically signed by: Penne Croak, DO 04/17/2024 12:03 PM        Impression & Plans:  Jesse [name] is a 68 y.o. male  1. Oropharyngeal dysphagia   2. Laryngopharyngeal reflux (LPR)   3. Hoarse voice quality   4. Hypertrophy of both inferior nasal turbinates   5. DNS (deviated nasal septum)   6. Mass of nasopharynx     - Findings and diagnoses discussed in detail with the patient. - Risks, benefits, and alternatives were reviewed. Through shared decision making, the patient elects to proceed with below. Assessment & Plan Nasal mass, likely cyst, scheduled for surgical excision Nasal mass on PET scan, likely cyst. Differential includes benign cyst vs. malignancy. No cancer signs observed. Surgical excision for diagnosis and treatment preferred to prevent complications. - Scheduled surgical excision under general anesthesia. - Monitor for post-operative epistaxis.  Oropharyngeal dysphagia Chronic dysphagia with globus sensation, possible reflux-related etiology. No prior swallow study or therapy. - Ordered swallow study at [facility]. - Referred to swallow therapy.  Gastroesophageal reflux disease Symptoms suggestive of GERD, including globus sensation and gurgling, especially  when supine. No current acid reflux medication.  Given the patient's tobacco use, I also discussed cessation and options for cessation, including counseling. Counseled patient on the dangers of tobacco use, advised patient to stop smoking, and reviewed strategies to maximize success. We reviewed treatment options to assist him quit smoking including NRT, Chantix, and Bupropion . Patient is ready to quit, and declined further treatment. Using patch. Total time spent with this was 4 minutes.   CPT 99406 (4-10 mins)  - Orders placed:  Orders Placed This Encounter  Procedures   Ambulatory Referral For Surgery Scheduling   Ambulatory referral to Speech Therapy   SLP  modified barium swallow   - Medications prescribed/continued/adjusted:  Meds ordered this encounter  Medications   omeprazole  (PRILOSEC) 20 MG capsule    Sig: Take 1 capsule (20 mg total) by mouth daily before breakfast.    Dispense:  30 capsule    Refill:  3   - Education materials provided to the patient. - Follow up: for procedure. Patient instructed to return sooner or go to the ED if new/worsening symptoms develop.   Thank you for allowing me the opportunity to care for your patient. Please do not hesitate to contact me should you have any other questions.  Sincerely, Penne Croak, DO Otolaryngologist (ENT) [facility] Phone: [phone] Fax: [phone]  04/17/2024, 12:03 PM

## 2024-04-17 ENCOUNTER — Encounter (HOSPITAL_COMMUNITY)
Admission: RE | Admit: 2024-04-17 | Discharge: 2024-04-17 | Disposition: A | Discharge status: Home / Self Care | Source: Ambulatory Visit | Attending: Oncology | Admitting: Oncology

## 2024-04-17 ENCOUNTER — Other Ambulatory Visit: Payer: Self-pay | Admitting: Thoracic Surgery (Cardiothoracic Vascular Surgery)

## 2024-04-17 ENCOUNTER — Telehealth: Payer: Self-pay | Admitting: Oncology

## 2024-04-17 DIAGNOSIS — R911 Solitary pulmonary nodule: Secondary | ICD-10-CM

## 2024-04-17 DIAGNOSIS — C7951 Secondary malignant neoplasm of bone: Secondary | ICD-10-CM | POA: Diagnosis not present

## 2024-04-17 DIAGNOSIS — D3A090 Benign carcinoid tumor of the bronchus and lung: Secondary | ICD-10-CM | POA: Diagnosis not present

## 2024-04-17 MED ORDER — COPPER CU 64 DOTATATE 1 MCI/ML IV SOLN
4.0000 | Freq: Once | INTRAVENOUS | Status: AC
  Administered 2024-04-17: 3.742 via INTRAVENOUS

## 2024-04-17 NOTE — Telephone Encounter (Signed)
 Per secure chat, pt wanted directions to his PET scan at [facility].   I called and spoke with pt and told him the PET was at [facility] and I tried to give him their phone number but he was driving and could not write it down.

## 2024-04-18 ENCOUNTER — Ambulatory Visit (HOSPITAL_COMMUNITY)

## 2024-04-18 ENCOUNTER — Ambulatory Visit: Payer: Self-pay | Admitting: Thoracic Surgery (Cardiothoracic Vascular Surgery)

## 2024-04-21 ENCOUNTER — Telehealth (HOSPITAL_COMMUNITY): Payer: Self-pay

## 2024-04-21 NOTE — Telephone Encounter (Signed)
 Second attempt to contact pt to schedule swallow study. Left voicemail and call back request. (AHARRIS)

## 2024-04-22 ENCOUNTER — Emergency Department

## 2024-04-22 ENCOUNTER — Other Ambulatory Visit: Payer: Self-pay

## 2024-04-22 ENCOUNTER — Observation Stay

## 2024-04-22 ENCOUNTER — Observation Stay
Admission: EM | Admit: 2024-04-22 | Discharge: 2024-04-23 | Disposition: A | Discharge status: Home / Self Care | Attending: Obstetrics and Gynecology | Admitting: Obstetrics and Gynecology

## 2024-04-22 DIAGNOSIS — F17210 Nicotine dependence, cigarettes, uncomplicated: Secondary | ICD-10-CM | POA: Diagnosis not present

## 2024-04-22 DIAGNOSIS — I2699 Other pulmonary embolism without acute cor pulmonale: Principal | ICD-10-CM | POA: Diagnosis present

## 2024-04-22 DIAGNOSIS — C7A090 Malignant carcinoid tumor of the bronchus and lung: Secondary | ICD-10-CM | POA: Diagnosis not present

## 2024-04-22 DIAGNOSIS — E785 Hyperlipidemia, unspecified: Secondary | ICD-10-CM | POA: Insufficient documentation

## 2024-04-22 DIAGNOSIS — Z96643 Presence of artificial hip joint, bilateral: Secondary | ICD-10-CM | POA: Diagnosis not present

## 2024-04-22 DIAGNOSIS — I10 Essential (primary) hypertension: Secondary | ICD-10-CM | POA: Diagnosis not present

## 2024-04-22 DIAGNOSIS — Z79899 Other long term (current) drug therapy: Secondary | ICD-10-CM | POA: Insufficient documentation

## 2024-04-22 DIAGNOSIS — Z902 Acquired absence of lung [part of]: Secondary | ICD-10-CM | POA: Diagnosis not present

## 2024-04-22 DIAGNOSIS — G250 Essential tremor: Secondary | ICD-10-CM | POA: Diagnosis not present

## 2024-04-22 DIAGNOSIS — Z7901 Long term (current) use of anticoagulants: Secondary | ICD-10-CM | POA: Diagnosis not present

## 2024-04-22 DIAGNOSIS — I2510 Atherosclerotic heart disease of native coronary artery without angina pectoris: Secondary | ICD-10-CM | POA: Diagnosis not present

## 2024-04-22 DIAGNOSIS — R0789 Other chest pain: Secondary | ICD-10-CM | POA: Diagnosis present

## 2024-04-22 DIAGNOSIS — I2694 Multiple subsegmental pulmonary emboli without acute cor pulmonale: Secondary | ICD-10-CM | POA: Diagnosis not present

## 2024-04-22 DIAGNOSIS — R918 Other nonspecific abnormal finding of lung field: Secondary | ICD-10-CM | POA: Diagnosis not present

## 2024-04-22 LAB — HEPATIC FUNCTION PANEL
ALT: 7 U/L (ref 0–44)
AST: 18 U/L (ref 15–41)
Albumin: 4.1 g/dL (ref 3.5–5.0)
Alkaline Phosphatase: 68 U/L (ref 38–126)
Bilirubin, Direct: 0.2 mg/dL (ref 0.0–0.2)
Indirect Bilirubin: 0.3 mg/dL (ref 0.3–0.9)
Total Bilirubin: 0.4 mg/dL (ref 0.0–1.2)
Total Protein: 7.5 g/dL (ref 6.5–8.1)

## 2024-04-22 LAB — PROTIME-INR
INR: 0.9 (ref 0.8–1.2)
Prothrombin Time: 13.2 s (ref 11.4–15.2)

## 2024-04-22 LAB — BASIC METABOLIC PANEL WITH GFR
Anion gap: 9 (ref 5–15)
BUN: 7 mg/dL — ABNORMAL LOW (ref 8–23)
CO2: 26 mmol/L (ref 22–32)
Calcium: 9.3 mg/dL (ref 8.9–10.3)
Chloride: 105 mmol/L (ref 98–111)
Creatinine, Ser: 0.98 mg/dL (ref 0.61–1.24)
GFR, Estimated: >60 mL/min (ref >60)
Glucose, Bld: 110 mg/dL — ABNORMAL HIGH (ref 70–99)
Potassium: 4.1 mmol/L (ref 3.5–5.1)
Sodium: 139 mmol/L (ref 135–145)

## 2024-04-22 LAB — CBC
HCT: 42.8 % (ref 39.0–52.0)
Hemoglobin: 14.4 g/dL (ref 13.0–17.0)
MCH: 29.2 pg (ref 26.0–34.0)
MCHC: 33.6 g/dL (ref 30.0–36.0)
MCV: 86.8 fL (ref 80.0–100.0)
Platelets: 228 K/uL (ref 150–400)
RBC: 4.93 MIL/uL (ref 4.22–5.81)
RDW: 13.3 % (ref 11.5–15.5)
WBC: 6.9 K/uL (ref 4.0–10.5)
nRBC: 0 % (ref 0.0–0.2)

## 2024-04-22 LAB — TROPONIN T, HIGH SENSITIVITY
Troponin T High Sensitivity: <15 ng/L (ref 0–19)
Troponin T High Sensitivity: 18 ng/L (ref 0–19)

## 2024-04-22 LAB — HEPARIN LEVEL (UNFRACTIONATED): Heparin Unfractionated: >1.1 [IU]/mL — ABNORMAL HIGH (ref 0.30–0.70)

## 2024-04-22 MED ORDER — ONDANSETRON HCL 4 MG PO TABS: 4.0000 mg | ORAL_TABLET | Freq: Four times a day (QID) | ORAL | Status: DC | PRN

## 2024-04-22 MED ORDER — ACETAMINOPHEN 325 MG PO TABS: 650.0000 mg | ORAL_TABLET | Freq: Four times a day (QID) | ORAL | Status: DC | PRN

## 2024-04-22 MED ORDER — PANTOPRAZOLE SODIUM 40 MG PO TBEC
40.0000 mg | DELAYED_RELEASE_TABLET | Freq: Every day | ORAL | Status: DC
  Administered 2024-04-22 – 2024-04-23 (×2): 40 mg via ORAL
  Filled 2024-04-22 (×2): qty 1

## 2024-04-22 MED ORDER — NICOTINE 21 MG/24HR TD PT24
21.0000 mg | MEDICATED_PATCH | Freq: Every day | TRANSDERMAL | Status: DC
  Administered 2024-04-22 – 2024-04-23 (×2): 21 mg via TRANSDERMAL
  Filled 2024-04-22 (×2): qty 1

## 2024-04-22 MED ORDER — PROPRANOLOL HCL ER 60 MG PO CP24
60.0000 mg | ORAL_CAPSULE | Freq: Every day | ORAL | Status: DC
  Administered 2024-04-22 – 2024-04-23 (×2): 60 mg via ORAL
  Filled 2024-04-22 (×2): qty 1

## 2024-04-22 MED ORDER — ONDANSETRON HCL 4 MG/2ML IJ SOLN: 4.0000 mg | Freq: Four times a day (QID) | INTRAMUSCULAR | Status: DC | PRN

## 2024-04-22 MED ORDER — HEPARIN (PORCINE) 25000 UT/250ML-% IV SOLN
1150.0000 [IU]/h | INTRAVENOUS | Status: DC
  Administered 2024-04-23 (×2): 1150 [IU]/h via INTRAVENOUS
  Filled 2024-04-22: qty 250

## 2024-04-22 MED ORDER — HEPARIN (PORCINE) 25000 UT/250ML-% IV SOLN
1450.0000 [IU]/h | INTRAVENOUS | Status: DC
  Administered 2024-04-22: 1450 [IU]/h via INTRAVENOUS
  Filled 2024-04-22: qty 250

## 2024-04-22 MED ORDER — TRAZODONE HCL 50 MG PO TABS: 25.0000 mg | ORAL_TABLET | Freq: Every evening | ORAL | Status: DC | PRN

## 2024-04-22 MED ORDER — ALBUTEROL SULFATE (2.5 MG/3ML) 0.083% IN NEBU: 2.5000 mg | INHALATION_SOLUTION | RESPIRATORY_TRACT | Status: DC | PRN

## 2024-04-22 MED ORDER — IOHEXOL 350 MG/ML SOLN
75.0000 mL | Freq: Once | INTRAVENOUS | Status: AC | PRN
  Administered 2024-04-22: 75 mL via INTRAVENOUS

## 2024-04-22 MED ORDER — TRAMADOL HCL 50 MG PO TABS: 50.0000 mg | ORAL_TABLET | Freq: Four times a day (QID) | ORAL | Status: DC | PRN

## 2024-04-22 MED ORDER — HEPARIN BOLUS VIA INFUSION
5700.0000 [IU] | Freq: Once | INTRAVENOUS | Status: AC
  Administered 2024-04-22: 5700 [IU] via INTRAVENOUS
  Filled 2024-04-22: qty 5700

## 2024-04-22 MED ORDER — ACETAMINOPHEN 650 MG RE SUPP: 650.0000 mg | Freq: Four times a day (QID) | RECTAL | Status: DC | PRN

## 2024-04-22 MED ORDER — MORPHINE SULFATE (PF) 4 MG/ML IV SOLN
4.0000 mg | Freq: Once | INTRAVENOUS | Status: AC
  Administered 2024-04-22: 4 mg via INTRAVENOUS
  Filled 2024-04-22: qty 1

## 2024-04-22 MED ORDER — ROSUVASTATIN CALCIUM 10 MG PO TABS
40.0000 mg | ORAL_TABLET | Freq: Every day | ORAL | Status: DC
  Administered 2024-04-22 – 2024-04-23 (×2): 40 mg via ORAL
  Filled 2024-04-22: qty 4
  Filled 2024-04-22: qty 2

## 2024-04-22 NOTE — H&P (Signed)
 History and Physical  Jesse [name] FMW:[id number] DOB: 06-18-1955 DOA: 04/22/2024  PCP: Everlene Parris LABOR, MD   Chief Complaint: left sided chest pain   HPI: Jesse [name] is a 68 y.o. 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  Patient states he has been recovering well from his surgery, he has been ambulating as instructed, and has had minimal discomfort, mainly just over his incision sites from his surgery.  However this morning when he woke up he had pleuritic chest pain that he had not had in the past.  He denies any cough, significant shortness of breath, fevers, chills, nausea, vomiting, or any other concerns.  He presented to the ER for evaluation, where CT scan as detailed below shows evidence of nonocclusive right sided PE.  [name] stable on room air with stable blood pressure, so was started on IV heparin and hospitalist was contacted for admission.  Review of Systems: Please see HPI for pertinent positives and negatives. A complete 10 system review of systems are otherwise negative.  Past Medical History:  Diagnosis Date   Anxiety    Arthritis    Coronary artery disease    Depression 02/15/2014   Hypertension    Myocardial infarction [facility])    NSTEMI   PE (pulmonary thromboembolism) (HCC) 09/09/2015   Pre-diabetes    Pulmonary embolism (HCC)    Sleep disturbance 02/15/2014   Tremors of nervous system    Trochanteric bursitis of left hip 08/30/2018   Past Surgical History:  Procedure Laterality Date   BRONCHIAL NEEDLE ASPIRATION BIOPSY  03/06/2024   Procedure: BRONCHOSCOPY, WITH NEEDLE ASPIRATION BIOPSY;  Surgeon: Shelah Lamar RAMAN, MD;  Location: [facility] ENDOSCOPY;  Service: Pulmonary;;   CRYOTHERAPY  03/06/2024   Procedure:  CRYOTHERAPY;  Surgeon: Shelah Lamar RAMAN, MD;  Location: [facility] ENDOSCOPY;  Service: Pulmonary;;   ENDOBRONCHIAL ULTRASOUND Left 03/06/2024   Procedure: ENDOBRONCHIAL ULTRASOUND (EBUS);  Surgeon: Shelah Lamar RAMAN, MD;  Location: [facility] ENDOSCOPY;  Service: Pulmonary;  Laterality: Left;   FRACTURE SURGERY Right    pin placed on right leg   FUDUCIAL PLACEMENT  03/06/2024   Procedure: INSERTION, FIDUCIAL MARKER, GOLD;  Surgeon: Shelah Lamar RAMAN, MD;  Location: [facility] ENDOSCOPY;  Service: Pulmonary;;   INTERCOSTAL NERVE BLOCK Left 03/31/2024   Procedure: BLOCK, NERVE, INTERCOSTAL;  Surgeon: Kerrin Elspeth BROCKS, MD;  Location: [facility] OR;  Service: Thoracic;  Laterality: Left;   LOBECTOMY, LUNG, ROBOT-ASSISTED, USING VATS Left 03/31/2024   Procedure: LOBECTOMY, LUNG, ROBOT-ASSISTED, USING VATS;  Surgeon: Kerrin Elspeth BROCKS, MD;  Location: [facility] OR;  Service: Thoracic;  Laterality: Left;  Robotic left lower lobectomy   SCALENE NODE BIOPSY Left 03/31/2024   Procedure: BIOPSY, LYMPH NODE;  Surgeon: Kerrin Elspeth BROCKS, MD;  Location: [facility] OR;  Service: Thoracic;  Laterality: Left;   TOTAL HIP ARTHROPLASTY Bilateral    VIDEO BRONCHOSCOPY WITH ENDOBRONCHIAL NAVIGATION Left 03/06/2024   Procedure: VIDEO BRONCHOSCOPY WITH ENDOBRONCHIAL NAVIGATION;  Surgeon: Shelah Lamar RAMAN, MD;  Location: [facility] ENDOSCOPY;  Service: Pulmonary;  Laterality: Left;  Left lower lobe   Social History:  reports that he has been smoking cigarettes. He started smoking about 36 years ago. He has a 24 pack-year smoking history. He has never used smokeless tobacco. He reports that he does not drink alcohol and does not use drugs.  Allergies  Allergen Reactions   Bactrim [Sulfamethoxazole-Trimethoprim] Other (See Comments)    Acute kidney injury   Penicillins Hives    Family History  Problem Relation Age of Onset   Diabetes Mother    Alzheimer's disease Mother    Gout Mother    Hyperlipidemia Mother    Hypertension Mother    Diabetes Father     Coronary artery disease Father 4       stent   Heart disease Father    Hypertension Father    Healthy Sister    Healthy Sister    Healthy Sister    Lung disease Brother    Healthy Brother    Healthy Brother    Healthy Daughter    Healthy Son    Breast cancer Neg Hx      Prior to Admission medications   Medication Sig Start Date End Date Taking? Authorizing Provider  acetaminophen  (TYLENOL ) 325 MG tablet Take 2 tablets (650 mg total) by mouth every 6 (six) hours as needed for mild pain (pain score 1-3). 04/02/24   Raguel Con RAMAN, PA-C  nicotine  (NICODERM CQ  - DOSED IN MG/24 HOURS) 21 mg/24hr patch Place 1 patch (21 mg total) onto the skin daily. 04/12/24   Zafirov, Clarissa A, MD  omeprazole  (PRILOSEC) 20 MG capsule Take 1 capsule (20 mg total) by mouth daily before breakfast. 04/14/24   Anice Riis, DO  propranolol  ER (INDERAL  LA) 60 MG 24 hr capsule Take 1 capsule (60 mg total) by mouth daily. For tremor 01/14/24   Zafirov, Clarissa A, MD  rosuvastatin  (CRESTOR ) 40 MG tablet Take 1 tablet (40 mg total) by mouth daily. 03/15/24 03/15/25  Furth, Cadence H, PA-C  traMADol  (ULTRAM ) 50 MG tablet Take 1 tablet (50 mg total) by mouth every 6 (six) hours as needed for severe pain (pain score 7-10). 04/02/24   Raguel Con RAMAN, PA-C    Physical Exam: BP (!) 135/93   Pulse (!) 56   Temp (!) 97.4 F (36.3 C) (Oral)   Resp 20   Ht 5' 11 (1.803 m)   Wt 86.6 kg   SpO2 100%   BMI 26.64 kg/m  General:  Alert, oriented, calm, in no acute distress  Eyes: EOMI, clear conjuctivae, white sclerea Neck: supple, no masses, trachea mildline  Cardiovascular: RRR, no murmurs or rubs, no peripheral edema  Respiratory: clear to auscultation bilaterally, no wheezes, no crackles  Abdomen: soft, nontender, nondistended, normal bowel tones heard  Skin: dry, no rashes  Musculoskeletal: no joint effusions, normal range of motion  Psychiatric: appropriate affect, normal speech  Neurologic:  extraocular muscles intact, clear speech, moving all extremities with intact sensorium         Labs on Admission:  Basic Metabolic Panel: Recent Labs  Lab 04/22/24 1250  NA 139  K 4.1  CL 105  CO2 26  GLUCOSE 110*  BUN 7*  CREATININE 0.98  CALCIUM  9.3   Liver Function Tests: Recent Labs  Lab 04/22/24 1433  AST 18  ALT 7  ALKPHOS 68  BILITOT 0.4  PROT 7.5  ALBUMIN  4.1   No results for input(s): LIPASE, AMYLASE in the last 168 hours. No results for input(s): AMMONIA in the last 168 hours. CBC: Recent Labs  Lab 04/22/24 1250  WBC 6.9  HGB 14.4  HCT 42.8  MCV 86.8  PLT 228   Cardiac Enzymes: No results for input(s): CKTOTAL, CKMB, CKMBINDEX, TROPONINI in the last 168 hours. BNP (last 3 results) No results for  input(s): BNP in the last 8760 hours.  ProBNP (last 3 results) No results for input(s): PROBNP in the last 8760 hours.  CBG: No results for input(s): GLUCAP in the last 168 hours.  Radiological Exams on Admission: CT Angio Chest Pulmonary Embolism (PE) W or WO Contrast Result Date: 04/22/2024 CLINICAL DATA:  High probability for PE.  Left-sided chest pain. EXAM: CT ANGIOGRAPHY CHEST WITH CONTRAST TECHNIQUE: Multidetector CT imaging of the chest was performed using the standard protocol during bolus administration of intravenous contrast. Multiplanar CT image reconstructions and MIPs were obtained to evaluate the vascular anatomy. RADIATION DOSE REDUCTION: This exam was performed according to the departmental dose-optimization program which includes automated exposure control, adjustment of the mA and/or kV according to patient size and/or use of iterative reconstruction technique. CONTRAST:  75mL OMNIPAQUE  IOHEXOL  350 MG/ML SOLN COMPARISON:  Chest CT 02/03/2024. PET-CT 04/17/2024. FINDINGS: Cardiovascular: There are right lower lobe segmental and subsegmental pulmonary emboli. No large central pulmonary embolism. Heart and aorta are normal in  size. There is no pericardial effusion. Mediastinum/Nodes: There are mildly enlarged right hilar lymph nodes measuring up to 10 mm similar to prior. There are additional nonenlarged bilateral hilar and mediastinal lymph nodes. The visualized thyroid gland and esophagus are within normal limits. Lungs/Pleura: There is a moderate-sized left pleural effusion which appears unchanged. Patient is status post resection of left lower lobe nodule. There is mild atelectasis in the right lower lobe. There is no new focal lung infiltrate or pneumothorax. Mild emphysema present. Upper Abdomen: Hepatic and renal cysts are unchanged. Musculoskeletal: Bilateral gynecomastia again noted. No acute or significant osseous findings. Review of the MIP images confirms the above findings. IMPRESSION: 1. Right lower lobe segmental and subsegmental pulmonary emboli. 2. Stable moderate-sized left pleural effusion. 3. Stable postsurgical changes in the left lower lobe. 4. Stable mild right hilar lymphadenopathy. 5. Emphysema. Emphysema (ICD10-J43.9). Electronically Signed   By: Greig Pique M.D.   On: 04/22/2024 15:22   DG Chest 2 View Result Date: 04/22/2024 EXAM: 2 VIEW(S) XRAY OF THE CHEST 04/22/2024 12:39:25 PM COMPARISON: 04/02/2024 CLINICAL HISTORY: FINDINGS: LUNGS AND PLEURA: Mild left basilar atelectasis or infiltrate is noted with minimal left pleural effusion. No pneumothorax. HEART AND MEDIASTINUM: No acute abnormality of the cardiac and mediastinal silhouettes. BONES AND SOFT TISSUES: No acute osseous abnormality. IMPRESSION: 1. Mild left basilar atelectasis or infiltrate with minimal left pleural effusion. Electronically signed by: Lynwood Seip MD 04/22/2024 12:59 PM EST RP Workstation: [id number]   Assessment/Plan Jesse [name] is a 68 y.o. 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    Acute pulmonary emboli-in the postoperative setting, as well as with a history of active malignancy.  He also has prior history of PE.  Patient is hemodynamically stable, without evidence of right heart strain, and no cor pulmonale. -Observation admission -Monitor on telemetry -IV heparin  -Check bilateral lower extremity Dopplers -Anticipate transition to Eliquis  in the morning, will need lifelong anticoagulation  Essential tremor-Inderal   Hyperlipidemia-Crestor     Code Status: Full Code  Consults called: None  Admission status: Observation  Time spent: 49 minutes  Morgin Halls CHRISTELLA Gail MD [facility] Pager [phone]  If 7PM-7AM, please contact night-coverage [email or web address] Password [email or web address]  04/22/2024, 4:01 PM

## 2024-04-22 NOTE — ED Notes (Addendum)
 Pt returned from CT

## 2024-04-22 NOTE — Progress Notes (Signed)
 PHARMACY - ANTICOAGULATION CONSULT NOTE  Pharmacy Consult for heparin Indication: pulmonary embolus  Allergies  Allergen Reactions   Bactrim [Sulfamethoxazole-Trimethoprim] Other (See Comments)    Acute kidney injury   Penicillins Hives    Patient Measurements: Height: 5' 11 (180.3 cm) Weight: 86.6 kg (191 lb) IBW/kg (Calculated) : 75.3 HEPARIN DW (KG): 89  Vital Signs: Temp: 98.4 F (36.9 C) (11/29 2031) Temp Source: Oral (11/29 2031) BP: 113/95 (11/29 2202) Pulse Rate: 64 (11/29 2202)  Labs: Recent Labs    04/22/24 1250 04/22/24 1616 04/22/24 2239  HGB 14.4  --   --   HCT 42.8  --   --   PLT 228  --   --   LABPROT  --  13.2  --   INR  --  0.9  --   HEPARINUNFRC  --   --  >1.10*  CREATININE 0.98  --   --     Estimated Creatinine Clearance: 77.9 mL/min (by C-G formula based on SCr of 0.98 mg/dL).   Medical History: Past Medical History:  Diagnosis Date   Anxiety    Arthritis    Coronary artery disease    Depression 02/15/2014   Hypertension    Myocardial infarction [facility])    NSTEMI   PE (pulmonary thromboembolism) (HCC) 09/09/2015   Pre-diabetes    Pulmonary embolism (HCC)    Sleep disturbance 02/15/2014   Tremors of nervous system    Trochanteric bursitis of left hip 08/30/2018   Assessment: 68 y/o male presenting with chest pain, found to have acute, right-sided segmental and sub-segmental pulmonary emboli. PMH significant for tobacco use, CAD, prior pulmonary embolism not on anticoagulation, hypertension, depression, recent lobectomy on 03/31/2024. Pharmacy has been consulted to initiate and manage heparin infusion.  Baseline labs: hgb 14.4, plt 228, INR ordered  Goal of Therapy:  Heparin level 0.3-0.7 units/ml Monitor platelets by anticoagulation protocol: Yes  1129 2239 HL >1.1, supratherapeutic   Plan:  Insurance Claims Handler.  Hold infusion for 1 hour. Restart heparin infusion at 1150 units/hr Recheck HL in 6 hrs after restart CBC daily while on  heparin  Rankin CANDIE Dills, PharmD, [facility] 04/22/2024 11:51 PM

## 2024-04-22 NOTE — ED Triage Notes (Signed)
 Pt to ED POV for post op problem. Pt states he had surgery to remove nodule to L lung about 2 weeks ago. He woke up today feeling discomfort and cramping feeling to L side and posterior ribcage area. States feels like a muscle spasm but also the pain is dull. Pt has hx PE and MI. Pt is not on thinners. Respirations are unlabored.

## 2024-04-22 NOTE — ED Notes (Signed)
 Pt eating dinner. No needs expressed. Pt denies pain. Call light within reach.

## 2024-04-22 NOTE — Progress Notes (Signed)
 PHARMACY - ANTICOAGULATION CONSULT NOTE  Pharmacy Consult for heparin Indication: pulmonary embolus  Allergies  Allergen Reactions   Bactrim [Sulfamethoxazole-Trimethoprim] Other (See Comments)    Acute kidney injury   Penicillins Hives    Patient Measurements: Height: 5' 11 (180.3 cm) Weight: 89 kg (196 lb 3.4 oz) IBW/kg (Calculated) : 75.3 HEPARIN DW (KG): 89  Vital Signs: Temp: 97.4 F (36.3 C) (11/29 1203) Temp Source: Oral (11/29 1203) BP: 135/93 (11/29 1330) Pulse Rate: 56 (11/29 1330)  Labs: Recent Labs    04/22/24 1250  HGB 14.4  HCT 42.8  PLT 228  CREATININE 0.98    Estimated Creatinine Clearance: 77.9 mL/min (by C-G formula based on SCr of 0.98 mg/dL).   Medical History: Past Medical History:  Diagnosis Date   Anxiety    Arthritis    Coronary artery disease    Depression 02/15/2014   Hypertension    Myocardial infarction [facility])    NSTEMI   PE (pulmonary thromboembolism) (HCC) 09/09/2015   Pre-diabetes    Pulmonary embolism (HCC)    Sleep disturbance 02/15/2014   Tremors of nervous system    Trochanteric bursitis of left hip 08/30/2018   Assessment: 68 y/o male presenting with chest pain, found to have acute, right-sided segmental and sub-segmental pulmonary emboli. PMH significant for tobacco use, CAD, prior pulmonary embolism not on anticoagulation, hypertension, depression, recent lobectomy on 03/31/2024. Pharmacy has been consulted to initiate and manage heparin infusion.  Baseline labs: hgb 14.4, plt 228, INR ordered  Goal of Therapy:  Heparin level 0.3-0.7 units/ml Monitor platelets by anticoagulation protocol: Yes   Plan:  Give 5700 units bolus x 1 Start heparin infusion at 1450 units/hr Check anti-Xa level in 6 hours and daily while on heparin Continue to monitor H&H and platelets  Thank you for involving pharmacy in this patient's care.   Damien Napoleon, PharmD Clinical Pharmacist 04/22/2024 3:39 PM

## 2024-04-22 NOTE — ED Notes (Signed)
 Ultrasound tech at bedside

## 2024-04-22 NOTE — ED Notes (Signed)
 Xray here for pt - xray will take pt to room 16 after scan

## 2024-04-22 NOTE — ED Provider Notes (Signed)
 [facility] Provider Note    Event Date/Time   First MD Initiated Contact with Patient 04/22/24 1245     (approximate)   History   Post-op Problem and Back Pain   HPI  Jesse [name] is a 68 y.o. male past medical history significant for tobacco use, CAD, prior pulmonary embolism not on anticoagulation, hypertension, depression, recent lobectomy on 03/31/2024, who presents to the emergency department for left-sided chest pain.  States that he has been doing okay until this morning when he woke up and was having significant pain to his left side.  States that he felt like he was having a muscle cramp which woke him up from sleep.  States that he is having sharp stabbing pain to the left side of his chest wall.  Denies any significant shortness of breath.  States that whenever he coughs his pain is worsened.  Does state that he has had a history of pulmonary embolism and is not currently on any anticoagulation.  Denies any swelling in his legs.  Denies nausea vomiting or diaphoresis.  Denies fever or chills.     Physical Exam   Triage Vital Signs: ED Triage Vitals  Encounter Vitals Group     BP 04/22/24 1200 (!) 141/87     Girls Systolic BP Percentile --      Girls Diastolic BP Percentile --      Boys Systolic BP Percentile --      Boys Diastolic BP Percentile --      Pulse Rate 04/22/24 1200 61     Resp 04/22/24 1200 20     Temp 04/22/24 1203 (!) 97.4 F (36.3 C)     Temp Source 04/22/24 1200 Oral     SpO2 04/22/24 1200 100 %     Weight 04/22/24 1202 196 lb 3.4 oz (89 kg)     Height 04/22/24 1202 5' 11 (1.803 m)     Head Circumference --      Peak Flow --      Pain Score 04/22/24 1200 8     Pain Loc --      Pain Education --      Exclude from Growth Chart --     Most recent vital signs: Vitals:   04/22/24 1203 04/22/24 1330  BP:  (!) 135/93  Pulse:  (!) 56  Resp:  20  Temp: (!) 97.4 F (36.3 C)   SpO2:  100%    Physical  Exam Constitutional:      Appearance: [name] well-developed.  HENT:     Head: Atraumatic.  Eyes:     Extraocular Movements: Extraocular movements intact.     Conjunctiva/sclera: Conjunctivae normal.     Pupils: Pupils are equal, round, and reactive to light.  Cardiovascular:     Rate and Rhythm: Regular rhythm.  Pulmonary:     Effort: No respiratory distress.  Abdominal:     General: Abdomen is flat.     Tenderness: There is no abdominal tenderness.  Musculoskeletal:        General: Normal range of motion.     Cervical back: Normal range of motion.     Right lower leg: No edema.     Left lower leg: No edema.  Skin:    General: Skin is warm.     Capillary Refill: Capillary refill takes less than 2 seconds.  Neurological:     Mental Status: [name] alert. Mental status is at baseline.  Psychiatric:  Mood and Affect: Mood normal.     IMPRESSION / MDM / ASSESSMENT AND PLAN / ED COURSE  I reviewed the triage vital signs and the nursing notes.  Differential diagnosis including pulmonary embolism, ACS, pneumonia, CHF, malignancy  Moderate risk Wells criteria will obtain a CTA to further risk ratified for pulmonary embolism  EKG  I, Clotilda Punter, the attending physician, personally viewed and interpreted this ECG.  EKG showed normal sinus rhythm.  Normal intervals.  No chamber enlargement.  No significant ST elevation or depression.  No findings of acute ischemia or dysrhythmia  No tachycardic or bradycardic dysrhythmias while on cardiac telemetry.  RADIOLOGY I independently reviewed imaging, my interpretation of imaging: Chest x-ray with findings with questionable pleural effusion versus pneumonia  Discussed with radiologist CTA read with concern for acute pulmonary embolism on the right side.  Stable pleural effusion on the left side  LABS (all labs ordered are listed, but only abnormal results are displayed) Labs interpreted as -    Labs Reviewed  BASIC  METABOLIC PANEL WITH GFR - Abnormal; Notable for the following components:      Result Value   Glucose, Bld 110 (*)    BUN 7 (*)    All other components within normal limits  CBC  HEPATIC FUNCTION PANEL  TROPONIN T, HIGH SENSITIVITY  TROPONIN T, HIGH SENSITIVITY     MDM  Lab work overall reassuring with no significant leukocytosis or anemia.  Creatinine at baseline with no significant electrolyte abnormality.  Serial troponins are negative and have low suspicion for ACS.  CTA with findings concerning for pulmonary embolism.  Not currently on anticoagulation.  Started on heparin.  Pleural effusion on the left side.  Appears stable.  Consulted hospitalist for admission for acute pulmonary embolism     PROCEDURES:  Critical Care performed: yes  .Critical Care  Performed by: Punter Clotilda, MD Authorized by: Punter Clotilda, MD   Critical care provider statement:    Critical care time (minutes):  30   Critical care time was exclusive of:  Separately billable procedures and treating other patients   Critical care was necessary to treat or prevent imminent or life-threatening deterioration of the following conditions:  Circulatory failure   Critical care was time spent personally by me on the following activities:  Development of treatment plan with patient or surrogate, discussions with consultants, evaluation of patient's response to treatment, examination of patient, ordering and review of laboratory studies, ordering and review of radiographic studies, ordering and performing treatments and interventions, pulse oximetry, re-evaluation of patient's condition and review of old charts   Care discussed with: admitting provider     Patient's presentation is most consistent with acute presentation with potential threat to life or bodily function.   MEDICATIONS ORDERED IN ED: Medications  morphine  (PF) 4 MG/ML injection 4 mg (has no administration in time range)  iohexol  (OMNIPAQUE ) 350  MG/ML injection 75 mL (75 mLs Intravenous Contrast Given 04/22/24 1502)    FINAL CLINICAL IMPRESSION(S) / ED DIAGNOSES   Final diagnoses:  Other acute pulmonary embolism without acute cor pulmonale (HCC)     Rx / DC Orders   ED Discharge Orders     None        Note:  This document was prepared using Dragon voice recognition software and may include unintentional dictation errors.   Punter Clotilda, MD 04/22/24 1536

## 2024-04-23 ENCOUNTER — Other Ambulatory Visit: Payer: Self-pay

## 2024-04-23 DIAGNOSIS — I2699 Other pulmonary embolism without acute cor pulmonale: Secondary | ICD-10-CM | POA: Diagnosis not present

## 2024-04-23 LAB — BASIC METABOLIC PANEL WITH GFR
Anion gap: 8 (ref 5–15)
BUN: 10 mg/dL (ref 8–23)
CO2: 27 mmol/L (ref 22–32)
Calcium: 9 mg/dL (ref 8.9–10.3)
Chloride: 104 mmol/L (ref 98–111)
Creatinine, Ser: 1.12 mg/dL (ref 0.61–1.24)
GFR, Estimated: >60 mL/min (ref >60)
Glucose, Bld: 96 mg/dL (ref 70–99)
Potassium: 4 mmol/L (ref 3.5–5.1)
Sodium: 138 mmol/L (ref 135–145)

## 2024-04-23 LAB — CBC
HCT: 36.8 % — ABNORMAL LOW (ref 39.0–52.0)
Hemoglobin: 12.6 g/dL — ABNORMAL LOW (ref 13.0–17.0)
MCH: 29.6 pg (ref 26.0–34.0)
MCHC: 34.2 g/dL (ref 30.0–36.0)
MCV: 86.4 fL (ref 80.0–100.0)
Platelets: 224 K/uL (ref 150–400)
RBC: 4.26 MIL/uL (ref 4.22–5.81)
RDW: 13.3 % (ref 11.5–15.5)
WBC: 7.6 K/uL (ref 4.0–10.5)
nRBC: 0 % (ref 0.0–0.2)

## 2024-04-23 LAB — MRSA NEXT GEN BY PCR, NASAL: MRSA by PCR Next Gen: NOT DETECTED

## 2024-04-23 LAB — HEPARIN LEVEL (UNFRACTIONATED): Heparin Unfractionated: 0.66 [IU]/mL (ref 0.30–0.70)

## 2024-04-23 LAB — GLUCOSE, CAPILLARY: Glucose-Capillary: 100 mg/dL — ABNORMAL HIGH (ref 70–99)

## 2024-04-23 MED ORDER — ENSURE PLUS HIGH PROTEIN PO LIQD
237.0000 mL | Freq: Two times a day (BID) | ORAL | Status: DC
  Administered 2024-04-23: 237 mL via ORAL

## 2024-04-23 MED ORDER — APIXABAN 5 MG PO TABS: 5.0000 mg | ORAL_TABLET | Freq: Two times a day (BID) | ORAL | 1 refills | Status: AC

## 2024-04-23 MED ORDER — APIXABAN 5 MG PO TABS
10.0000 mg | ORAL_TABLET | Freq: Once | ORAL | Status: AC
  Administered 2024-04-23: 10 mg via ORAL
  Filled 2024-04-23: qty 2

## 2024-04-23 MED ORDER — ORAL CARE MOUTH RINSE: 15.0000 mL | OROMUCOSAL | Status: DC | PRN

## 2024-04-23 MED ORDER — APIXABAN (ELIQUIS) VTE STARTER PACK (10MG AND 5MG): ORAL_TABLET | ORAL | 0 refills | Status: AC

## 2024-04-23 MED ORDER — CHLORHEXIDINE GLUCONATE CLOTH 2 % EX PADS
6.0000 | MEDICATED_PAD | Freq: Every day | CUTANEOUS | Status: DC
  Administered 2024-04-23: 6 via TOPICAL

## 2024-04-23 NOTE — Plan of Care (Signed)
 The patient has been discharged. IV's have been removed. Education has been completed using the teach back method. The patient has been wheeled out to his car.  Problem: Education: Goal: Knowledge of General Education information will improve Description: Including pain rating scale, medication(s)/side effects and non-pharmacologic comfort measures Outcome: Completed/Met   Problem: Health Behavior/Discharge Planning: Goal: Ability to manage health-related needs will improve Outcome: Completed/Met   Problem: Clinical Measurements: Goal: Ability to maintain clinical measurements within normal limits will improve Outcome: Completed/Met Goal: Will remain free from infection Outcome: Completed/Met Goal: Diagnostic test results will improve Outcome: Completed/Met Goal: Respiratory complications will improve Outcome: Completed/Met Goal: Cardiovascular complication will be avoided Outcome: Completed/Met   Problem: Activity: Goal: Risk for activity intolerance will decrease Outcome: Completed/Met   Problem: Nutrition: Goal: Adequate nutrition will be maintained Outcome: Completed/Met   Problem: Coping: Goal: Level of anxiety will decrease Outcome: Completed/Met   Problem: Elimination: Goal: Will not experience complications related to bowel motility Outcome: Completed/Met Goal: Will not experience complications related to urinary retention Outcome: Completed/Met   Problem: Pain Managment: Goal: General experience of comfort will improve and/or be controlled Outcome: Completed/Met   Problem: Safety: Goal: Ability to remain free from injury will improve Outcome: Completed/Met   Problem: Skin Integrity: Goal: Risk for impaired skin integrity will decrease Outcome: Completed/Met

## 2024-04-23 NOTE — Discharge Instructions (Signed)
 [facility] [address]  [phone]  https://www.countrysidevillaapts.[facility] [address]  [phone]  microlists.at [facility] Apartments [address]  [phone]  https://www.lowincomehousing.us  [address]  [phone]  https://www.newlifestyles.com/ [employer] [address]  [phone]  https://haws.org [facility] [address]  [phone]  https://www.cmc-Louisa.com/ [facility] [address]  [phone]  https://affordablehousingonline.com/ [employer] [address]  [phone]  https://www.seniorlivingguide.com [facility] [place] [address] Vonn Myrna Mickey Azalea  [phone]  https://affordablehousingonline.com [facility] [address]  [phone]  https://www.apartments.com [employer] [address]  [phone]  https://www.cmc-[place].com/ Food Pantry [facility] Rescue Mission [address] [place]  [phone]  repairself.es [facility] Food Bank [address] [place]  [phone]  cardumps.nl [facility] Food [address] [place].  [phone]  sunreplacement.co.uk [facility] Diocese [address] [place]  [phone]  giftcontent.is Crisis Control Ministry [address] [place]  [phone]  fujilinks.com.cy [facility] [address] [place]  [phone]  Website [address] Church [address]  [phone]  https://newzionmbc-gso.org/ [employer] [address]  [phone]  theyparty.dk [facility] [address] [place]  [phone]  https://www.drivingcalculator.gl [employer] [address]  [phone]  https://sunnysideministry.org/ [facility] [address] [place]  [phone]  https://www.bethesdacenter.org/ Care And Share Food Pantry [address]  [phone]  https://www.allen-morales.org/ Elden Rush CME Church [address]  [phone]  https://www.walkingtool.no Rent & Utility Assistance Experiment In Self Reliance [address] [place]  [phone]  https://eisr.org/ Passenger Transport Manager [address]  [phone]  fujilinks.com.cy [employer] Way [address] [phone]  http://www.mcclure.com/ [facility] DSS [address]  [phone]  https://www.forsyth.cc/hhs [facility] [address]  [phone]  https://sunnysideministry.org/ [employer] Stores [address]  [phone]  https://southernusa.salvationarmy.org [employer]iza[employer] Helping Hands [address]  [phone]  https://elizashelpinghands.org/ American Family Insurance [address] [place].  [phone]  https://www.Prepaidcds.co.nz [facility] [address] [place]  [phone]  https://www.drivingcalculator.gl Faith [phone]  [phone]  https://www.https://www.young.com/ [facility] [address]  [phone]  pokerreunion.com.cy Homeless Shelters Experiment In Self Reliance [address] [place]  [phone]  https://eisr.org/ Northeast Utilities [address] [place] [address]  [phone]  theyparty.dk [facility] [address] [place]  [phone]  repairself.es [facility] [address] [place]  [phone]  https://www.bethesdacenter.org/ [employer] Stores [address]  [phone]  https://southernusa.salvationarmy.org/ [facility] Nationwide  ([phone]) [phone]  http://www.pope.info/ [facility] [address]  [phone]  rateslash.tn [facility] [address].  [phone]  wealthaccelerators.nl [facility] [address] [place]  [phone]  https://www.harry4you.org/ [facility] [address] [place]  [phone]  http://www.west.com/ [employer] [place].  [phone]  https://www.vfwpost1134.org/ Low Income Housing Acorn C. Properties [address]  [phone]  https://propertymanage.9792350086 [place] [address]  [phone]  http://schroeder.biz/ Vill[place] at [facility] [address]  [phone]  https://senioridy.com/low-income-housing/the-villas-at-hickory-tree [place] at [facility] [address]  [phone]  tuxtravels.com.cy [place] at [employer] [address] at Ryland Group  [phone]  https://bradleydevelopers.com/ [facility] [address]  [phone]  https://www.affordablehousing.com/ [place] [address]  [phone]  https://www.bdclarchitects.com/ [facility] [address] Irene [place]  [phone]  https://www.georgetownvillas.com/online-application/ Ak [employer] Holding Corporation [address]  [phone]  domainthemes.com.br [address]  [phone]  https://www.whitfordplaceapts.com/ [facility] [address] [place]  [phone]  https://salemgardensapts.com/ [facility] [address]  [phone]  https://affordablehousingonline.com [facility] Apartments [address]  [phone]  https://affordablehousingonline.com/ [employer] General [address]  [phone]  http://schroeder.biz/ [facility] Apartments [address]  [phone]  https://westminstercompany.com/ [facility] [address]  [phone]  http://santos.net/ Nash Caller [address]  [phone]  cheapwipes.gl [facility] [address]  [phone]  http://www.steelegrouparchitects.com/ [place] [address]  [phone]  https://affordablehousingonline.com/ [facility] Apartments [address]  [phone]  https://affordablehousingonline.com/ [employer] [address]  [phone]  https://affordablehousingonline.com/ [facility] [place] at [facility] [address]  [phone]  https://wiencek-associates.com/ [facility] Trace [address]  [phone]  Website The [facility] [address]  [phone]  cheapwipes.gl [address]  [phone]  https://affordablehousingonline.com/ [facility] [address]  [phone]  https://www.cmc-Hector.com/ Wilder Glasser [address]  [phone]  https://www.cmc-.com/ [place] Apartments [address] Apartments  [phone]  cheapwipes.gl [facility] Place [address]  [phone]  openbloggers.co.uk [facility] Peak [address]  [phone]  https://www.cmc-.com/complex/propertyInfo.php?p=286 [facility] [address]  [phone]  https://ginkgo-olde-north-village.brotherbig.at [facility] [place] [address]  [phone]  https://www.huntparkapts.com/ [employer] [address] [place]  [phone]  http://powers-lewis.com/ [place] [address]  [phone]  hostesstraining.at Housing Rehabilitation Services [facility] Regional Council's Weatherization [address]  [phone]  https://burns.com/ [facility] of [place] [address]  [phone]  https://www.shepherdscenter.org/ [facility] of [place] [address]  [phone]  https://www.shepctrkville.com/ [facility] [address]  [phone]  ncdhhs.gov

## 2024-04-23 NOTE — TOC Transition Note (Signed)
 Transition of Care [facility]) - Discharge Note   Patient Details  Name: Jesse [name] MRN: [id number] Date of Birth: [birth year]  [facility] of [facility]) CM/SW Contact:  Victory Jackquline RAMAN, RN Phone Number: 04/23/2024, 11:04 AM   Clinical Narrative:    9:33 am: [facility] received a message via secure chat from the MD stating the patient was being discharged today. [name] living out of his fleeta, and asked to please provide him with housing resources prior to discharge.  10:00 am: RNCM met with patient at the bedside, introduced myself and my role and that discharge recommendations would be discussed. RNCM uploaded Resources to the AVS, discussed and gave the patient a list of Housing, Insurance Underwriter for [employer]. Patient was very appreciative and said that he would start calling place on the list once he is discharged. Patient has discharge orders. Nor further concerns. RNCM signing off.    Final next level of care: Home/Self Care Barriers to Discharge: Barriers Resolved   Patient Goals and CMS Choice            Discharge Placement                Patient to be transferred to facility by: Self Name of family member notified: Son Patient and family notified of of transfer: 04/23/24  Discharge Plan and Services Additional resources added to the After Visit Summary for                                       Social Drivers of Health (SDOH) Interventions SDOH Screenings   Food Insecurity: Food Insecurity Present (04/23/2024)  Housing: High Risk (04/23/2024)  Transportation Needs: Unmet Transportation Needs (04/23/2024)  Utilities: Not At Risk (04/23/2024)  Depression (PHQ2-9): Low Risk  (03/16/2024)  Financial Resource Strain: High Risk (03/24/2024)  Physical Activity: Inactive (03/24/2024)  Social Connections: Socially Isolated (04/23/2024)  Stress: Stress Concern Present (03/24/2024)  Tobacco Use: High Risk (04/22/2024)  Health Literacy: Adequate  Health Literacy (03/03/2024)     Readmission Risk Interventions     No data to display

## 2024-04-23 NOTE — Discharge Summary (Signed)
 [name] FMW:[id number] DOB: [birth year] DOA: 04/22/2024  PCP: Everlene Parris LABOR, MD  Admit date: 04/22/2024 Discharge date: 04/23/2024  Time spent: 35 minutes  Recommendations for Outpatient Follow-up:  Ct surg f/u 12/1 as scheduled Oncology f/u 12/2 as scheduled    Discharge Diagnoses:  Principal Problem:   Acute pulmonary embolism without acute cor pulmonale (HCC) Active Problems:   HTN (hypertension)   Coronary artery disease   Carcinoid bronchial adenoma (HCC)   Discharge Condition: stable  Diet recommendation: heart healthy  Filed Weights   04/22/24 1202 04/22/24 1559 04/23/24 0234  Weight: 89 kg 86.6 kg 86.3 kg    History of present illness:  From admission h and p Jesse [name] is a 68 y.o. 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  Patient states he has been recovering well from his surgery, he has been ambulating as instructed, and has had minimal discomfort, mainly just over his incision sites from his surgery.  However this morning when he woke up he had pleuritic chest pain that he had not had in the past.  He denies any cough, significant shortness of breath, fevers, chills, nausea, vomiting, or any other concerns.  He presented to the ER for evaluation, where CT scan as detailed below shows evidence of nonocclusive right sided PE.  [name] stable on room air with stable blood pressure, so was started on IV heparin and hospitalist was contacted for admission.   Hospital Course:   Patient with a history of PE no longer anticoagulated, recent diagnosis of lung cancer s/p recent (11/7) left lower lobectomy, presenting with one day pleuritic chest pain. Found to have RLL segmental and subsegmental PE, no signs of  symptoms right heart strain, no hypoxia or dyspnea. Started on IV heparin and monitored overnight, remains stable and pleuritic chest pain resolved. Will discharge on apixaban . Bleeding and nsaid precautions reviewed. Patient reports housing instability as well - TOC to see prior to discharge.   Procedures: none   Consultations: none  Discharge Exam: Vitals:   04/23/24 0600 04/23/24 0800  BP: 113/71   Pulse: (!) 57   Resp:  19  Temp:  97.8 F (36.6 C)  SpO2: 98%     General: NAD Cardiovascular: RRR Respiratory: CTAB, decreased sounds left base, rales at both bases  Discharge Instructions   Discharge Instructions     Diet - low sodium heart healthy   Complete by: As directed    Increase activity slowly   Complete by: As directed       Allergies as of 04/23/2024       Reactions   Bactrim [sulfamethoxazole-trimethoprim] Other (See Comments)   Acute kidney injury   Penicillins Hives        Medication List     TAKE these medications    acetaminophen  325 MG tablet Commonly known as: Tylenol  Take 2 tablets (650 mg total) by mouth every 6 (six) hours as needed for mild pain (pain score 1-3).   amLODipine  5 MG tablet Commonly known as: NORVASC  Take 5 mg by mouth daily.   Apixaban  Starter Pack (10mg  and 5mg ) Commonly known as: ELIQUIS  STARTER PACK Take as directed on package: start with two-5mg  tablets twice daily for 7 days. On day 8, switch to one-5mg  tablet twice daily.   apixaban   5 MG Tabs tablet Commonly known as: ELIQUIS  Take 1 tablet (5 mg total) by mouth 2 (two) times daily. Start AFTER finishing the starter pack   buPROPion  150 MG 12 hr tablet Commonly known as: WELLBUTRIN  SR Take 150 mg by mouth 2 (two) times daily.   gabapentin  300 MG capsule Commonly known as: NEURONTIN  Take 300 mg by mouth 3 (three) times daily.   nicotine  21 mg/24hr patch Commonly known as: NICODERM CQ  - dosed in mg/24 hours Place 1 patch (21 mg total) onto the skin  daily.   omeprazole  20 MG capsule Commonly known as: PRILOSEC Take 1 capsule (20 mg total) by mouth daily before breakfast.   pravastatin  40 MG tablet Commonly known as: PRAVACHOL  Take 40 mg by mouth daily.   propranolol  ER 60 MG 24 hr capsule Commonly known as: Inderal  LA Take 1 capsule (60 mg total) by mouth daily. For tremor   traMADol  50 MG tablet Commonly known as: ULTRAM  Take 1 tablet (50 mg total) by mouth every 6 (six) hours as needed for severe pain (pain score 7-10).       Allergies  Allergen Reactions   Bactrim [Sulfamethoxazole-Trimethoprim] Other (See Comments)    Acute kidney injury   Penicillins Hives    Follow-up Information     Everlene Parris LABOR, MD Follow up.   Specialty: Family Medicine Contact information: [address] [place] [address] [phone]                  The results of significant diagnostics from this hospitalization (including imaging, microbiology, ancillary and laboratory) are listed below for reference.    Significant Diagnostic Studies: US  Venous Img Lower Bilateral (DVT) Result Date: 04/22/2024 CLINICAL DATA:  Pulmonary embolism. EXAM: BILATERAL LOWER EXTREMITY VENOUS DOPPLER ULTRASOUND TECHNIQUE: Gray-scale sonography with graded compression, as well as color Doppler and duplex ultrasound were performed to evaluate the lower extremity deep venous systems from the level of the common femoral vein and including the common femoral, femoral, profunda femoral, popliteal and calf veins including the posterior tibial, peroneal and gastrocnemius veins when visible. The superficial great saphenous vein was also interrogated. Spectral Doppler was utilized to evaluate flow at rest and with distal augmentation maneuvers in the common femoral, femoral and popliteal veins. COMPARISON:  None Available. FINDINGS: RIGHT LOWER EXTREMITY Common Femoral Vein: No evidence of thrombus. Normal compressibility, respiratory phasicity and response  to augmentation. Saphenofemoral Junction: No evidence of thrombus. Normal compressibility and flow on color Doppler imaging. Profunda Femoral Vein: No evidence of thrombus. Normal compressibility and flow on color Doppler imaging. Femoral Vein: No evidence of thrombus. Normal compressibility, respiratory phasicity and response to augmentation. Popliteal Vein: No evidence of thrombus. Normal compressibility, respiratory phasicity and response to augmentation. Calf Veins: No evidence of thrombus. Normal compressibility and flow on color Doppler imaging. Superficial Great Saphenous Vein: No evidence of thrombus. Normal compressibility. Venous Reflux:  None. Other Findings:  None. LEFT LOWER EXTREMITY Common Femoral Vein: No evidence of thrombus. Normal compressibility, respiratory phasicity and response to augmentation. Saphenofemoral Junction: No evidence of thrombus. Normal compressibility and flow on color Doppler imaging. Profunda Femoral Vein: No evidence of thrombus. Normal compressibility and flow on color Doppler imaging. Femoral Vein: No evidence of thrombus. Normal compressibility, respiratory phasicity and response to augmentation. Popliteal Vein: No evidence of thrombus. Normal compressibility, respiratory phasicity and response to augmentation. Calf Veins: No evidence of thrombus. Normal compressibility and flow on color Doppler imaging. Superficial Great Saphenous Vein: No evidence of thrombus.  Normal compressibility. Venous Reflux:  None. Other Findings:  None. IMPRESSION: No evidence of deep venous thrombosis in either lower extremity. Electronically Signed   By: Suzen Dials M.D.   On: 04/22/2024 18:01   CT Angio Chest Pulmonary Embolism (PE) W or WO Contrast Result Date: 04/22/2024 CLINICAL DATA:  High probability for PE.  Left-sided chest pain. EXAM: CT ANGIOGRAPHY CHEST WITH CONTRAST TECHNIQUE: Multidetector CT imaging of the chest was performed using the standard protocol during bolus  administration of intravenous contrast. Multiplanar CT image reconstructions and MIPs were obtained to evaluate the vascular anatomy. RADIATION DOSE REDUCTION: This exam was performed according to the departmental dose-optimization program which includes automated exposure control, adjustment of the mA and/or kV according to patient size and/or use of iterative reconstruction technique. CONTRAST:  75mL OMNIPAQUE  IOHEXOL  350 MG/ML SOLN COMPARISON:  Chest CT 02/03/2024. PET-CT 04/17/2024. FINDINGS: Cardiovascular: There are right lower lobe segmental and subsegmental pulmonary emboli. No large central pulmonary embolism. Heart and aorta are normal in size. There is no pericardial effusion. Mediastinum/Nodes: There are mildly enlarged right hilar lymph nodes measuring up to 10 mm similar to prior. There are additional nonenlarged bilateral hilar and mediastinal lymph nodes. The visualized thyroid gland and esophagus are within normal limits. Lungs/Pleura: There is a moderate-sized left pleural effusion which appears unchanged. Patient is status post resection of left lower lobe nodule. There is mild atelectasis in the right lower lobe. There is no new focal lung infiltrate or pneumothorax. Mild emphysema present. Upper Abdomen: Hepatic and renal cysts are unchanged. Musculoskeletal: Bilateral gynecomastia again noted. No acute or significant osseous findings. Review of the MIP images confirms the above findings. IMPRESSION: 1. Right lower lobe segmental and subsegmental pulmonary emboli. 2. Stable moderate-sized left pleural effusion. 3. Stable postsurgical changes in the left lower lobe. 4. Stable mild right hilar lymphadenopathy. 5. Emphysema. Emphysema (ICD10-J43.9). Electronically Signed   By: Greig Pique M.D.   On: 04/22/2024 15:22   DG Chest 2 View Result Date: 04/22/2024 EXAM: 2 VIEW(S) XRAY OF THE CHEST 04/22/2024 12:39:25 PM COMPARISON: 04/02/2024 CLINICAL HISTORY: FINDINGS: LUNGS AND PLEURA: Mild left  basilar atelectasis or infiltrate is noted with minimal left pleural effusion. No pneumothorax. HEART AND MEDIASTINUM: No acute abnormality of the cardiac and mediastinal silhouettes. BONES AND SOFT TISSUES: No acute osseous abnormality. IMPRESSION: 1. Mild left basilar atelectasis or infiltrate with minimal left pleural effusion. Electronically signed by: Lynwood Seip MD 04/22/2024 12:59 PM EST RP Workstation: [id number]   NM PET DOTATATE SKULL BASE TO MID THIGH Result Date: 04/18/2024 EXAM: PET AND CT SKULL BASE TO MID THIGH 02/21/2024 04:33:20 PM TECHNIQUE: RADIOPHARMACEUTICAL: 3.7 mCi copper -64 DOTATATE Uptake time 60 minutes. PET imaging was acquired from the base of the skull to the mid thighs. Non-contrast enhanced computed tomography was obtained for attenuation correction and anatomic localization. COMPARISON: None available. CLINICAL HISTORY: carcinoid staging FINDINGS: HEAD AND NECK: Physiologic activity within the pituitary gland, salivary glands, and thyroid gland. No DOTATATE-avid cervical lymphadenopathy. CHEST: Interval resection of the nodule. There is a moderate layering left pleural effusion and left lung atelectasis following surgery. There are no radiotracer avid pulmonary nodules present in the left and right lung. No radiotracer avid mediastinal lymphadenopathy. ABDOMEN AND PELVIS: Physiologic activity within the liver, spleen, adrenal glands, kidneys, and bowel. Bilateral nonhepatic cysts are noted. Bilateral benign renal cysts. No abnormal activity in the liver or pancreas. No abnormal activity in the bowel. No DOTATATE-avid intraperitoneal mass or lymphadenopathy. BONES AND SOFT TISSUE: Skeletal metastases. IMPRESSION: 1.  No DOTATATE-avid disease. 2. Interval resection of the left lobe pulmonary nodule with associated left lung atelectasis and moderate left pleural effusion. Electronically signed by: Norleen Boxer MD 04/18/2024 10:31 AM EST RP Workstation: [id number]   DG Chest 2  View Result Date: 04/02/2024 CLINICAL DATA:  Pneumothorax. EXAM: CHEST - 2 VIEW COMPARISON:  Radiograph yesterday FINDINGS: No visualized pneumothorax. Increasing retrocardiac opacity. Subsegmental opacities in the periphery of the left lung base, as well as right lung base. Overall low lung volumes. Stable heart size and mediastinal contours. Possible left pleural effusion. IMPRESSION: 1. No visualized pneumothorax. 2. Increasing retrocardiac opacity may represent atelectasis, developing airspace disease or loculated pleural fluid. 3. Additional subsegmental opacities in the periphery of the left lung base, as well as right lung base, likely atelectasis. Electronically Signed   By: Andrea Gasman M.D.   On: 04/02/2024 12:11   DG Chest Port 1 View Result Date: 04/01/2024 EXAM: 1 VIEW(S) XRAY OF THE CHEST 04/01/2024 03:27:00 PM COMPARISON: 04/01/2024 CLINICAL HISTORY: Pneumothorax FINDINGS: LINES, TUBES AND DEVICES: Left chest tube removed. LUNGS AND PLEURA: Low lung volumes. Bibasilar atelectasis. HEART AND MEDIASTINUM: No acute abnormality of the cardiac and mediastinal silhouettes. BONES AND SOFT TISSUES: No acute osseous abnormality. IMPRESSION: 1. No pneumothorax following chest tube removal on the left 2. Low lung volumes with bibasilar atelectasis. Electronically signed by: Oneil Devonshire MD 04/01/2024 07:14 PM EST RP Workstation: [id number]   DG Chest Port 1 View Result Date: 04/01/2024 EXAM: 1 VIEW(S) XRAY OF THE CHEST 04/01/2024 06:40:00 AM COMPARISON: 03/31/2024 CLINICAL HISTORY: S/P lobectomy of lung FINDINGS: LINES, TUBES AND DEVICES: Left chest tube stable in place. LUNGS AND PLEURA: Low lung volumes. Stable bibasilar atelectasis. No focal pulmonary opacity. No pulmonary edema. No pleural effusion. No pneumothorax. HEART AND MEDIASTINUM: No acute abnormality of the cardiac and mediastinal silhouettes. BONES AND SOFT TISSUES: No acute osseous abnormality. IMPRESSION: 1. Stable bibasilar atelectasis  and low lung volumes. 2. Left chest tube stable in place. No pneumothorax Electronically signed by: Rockey Kilts MD 04/01/2024 10:33 AM EST RP Workstation: [id number]   DG Chest Port 1 View Result Date: 03/31/2024 EXAM: 1 VIEW(S) XRAY OF THE CHEST 03/31/2024 11:36:00 AM COMPARISON: Chest x-ray 03/29/2013. Chest CT 02/03/2004. CLINICAL HISTORY: S/P lobectomy of lung. FINDINGS: LINES, TUBES AND DEVICES: New left-sided chest tube in place. LUNGS AND PLEURA: No focal pulmonary opacity. No pulmonary edema. No pleural effusion. No pneumothorax. No new bands of atelectasis in the right lower lung. HEART AND MEDIASTINUM: No acute abnormality of the cardiac and mediastinal silhouettes. BONES AND SOFT TISSUES: No acute osseous abnormality. IMPRESSION: 1. No acute process. 2. New left-sided chest tube in place. Electronically signed by: Greig Pique MD 03/31/2024 08:35 PM EST RP Workstation: [id number]   DG Chest 2 View Result Date: 03/30/2024 CLINICAL DATA:  Preop evaluation.  Atypical carcinoid lung tumor. EXAM: CHEST - 2 VIEW COMPARISON:  Radiograph 03/06/2024, CT 02/03/2024 FINDINGS: The cardiomediastinal contours are stable. Aortic tortuosity. Left infrahilar fiducial marker, known nodule is faintly visualized. Pulmonary vasculature is normal. No consolidation, pleural effusion, or pneumothorax. No acute osseous abnormalities are seen. IMPRESSION: 1. No acute chest findings. 2. Left infrahilar fiducial marker, known nodule is faintly visualized. Electronically Signed   By: Andrea Gasman M.D.   On: 03/30/2024 17:42    Microbiology: Recent Results (from the past 240 hours)  MRSA Next Gen by PCR, Nasal     Status: None   Collection Time: 04/23/24  2:46 AM   Specimen: Nasal Mucosa; Nasal Swab  Result Value Ref Range Status   MRSA by PCR Next Gen NOT DETECTED NOT DETECTED Final    Comment: (NOTE) The GeneXpert MRSA Assay (FDA approved for NASAL specimens only), is one component of a comprehensive MRSA  colonization surveillance program. It is not intended to diagnose MRSA infection nor to guide or monitor treatment for MRSA infections. Test performance is not FDA approved in patients less than 48 years old. Performed at [facility], [address] [place], [place] [address]      Labs: Basic Metabolic Panel: Recent Labs  Lab 04/22/24 1250 04/23/24 0512  NA 139 138  K 4.1 4.0  CL 105 104  CO2 26 27  GLUCOSE 110* 96  BUN 7* 10  CREATININE 0.98 1.12  CALCIUM  9.3 9.0   Liver Function Tests: Recent Labs  Lab 04/22/24 1433  AST 18  ALT 7  ALKPHOS 68  BILITOT 0.4  PROT 7.5  ALBUMIN  4.1   No results for input(s): LIPASE, AMYLASE in the last 168 hours. No results for input(s): AMMONIA in the last 168 hours. CBC: Recent Labs  Lab 04/22/24 1250 04/23/24 0512  WBC 6.9 7.6  HGB 14.4 12.6*  HCT 42.8 36.8*  MCV 86.8 86.4  PLT 228 224   Cardiac Enzymes: No results for input(s): CKTOTAL, CKMB, CKMBINDEX, TROPONINI in the last 168 hours. BNP: BNP (last 3 results) No results for input(s): BNP in the last 8760 hours.  ProBNP (last 3 results) No results for input(s): PROBNP in the last 8760 hours.  CBG: No results for input(s): GLUCAP in the last 168 hours.     Signed:  Devaughn KATHEE Ban MD.  [facility] 04/23/2024, 9:33 AM

## 2024-04-23 NOTE — Progress Notes (Signed)
 PHARMACY - ANTICOAGULATION CONSULT NOTE  Pharmacy Consult for heparin Indication: pulmonary embolus  Allergies  Allergen Reactions   Bactrim [Sulfamethoxazole-Trimethoprim] Other (See Comments)    Acute kidney injury   Penicillins Hives    Patient Measurements: Height: 5' 11 (180.3 cm) Weight: 86.3 kg (190 lb 4.1 oz) IBW/kg (Calculated) : 75.3 HEPARIN DW (KG): 86.3  Vital Signs: Temp: 97.8 F (36.6 C) (11/30 0800) Temp Source: Oral (11/30 0800) BP: 113/71 (11/30 0600) Pulse Rate: 57 (11/30 0600)  Labs: Recent Labs    04/22/24 1250 04/22/24 1616 04/22/24 2239 04/23/24 0512 04/23/24 0802  HGB 14.4  --   --  12.6*  --   HCT 42.8  --   --  36.8*  --   PLT 228  --   --  224  --   LABPROT  --  13.2  --   --   --   INR  --  0.9  --   --   --   HEPARINUNFRC  --   --  >1.10*  --  0.66  CREATININE 0.98  --   --  1.12  --     Estimated Creatinine Clearance: 68.2 mL/min (by C-G formula based on SCr of 1.12 mg/dL).   Medical History: Past Medical History:  Diagnosis Date   Anxiety    Arthritis    Coronary artery disease    Depression 02/15/2014   Hypertension    Myocardial infarction [facility])    NSTEMI   PE (pulmonary thromboembolism) (HCC) 09/09/2015   Pre-diabetes    Pulmonary embolism (HCC)    Sleep disturbance 02/15/2014   Tremors of nervous system    Trochanteric bursitis of left hip 08/30/2018   Assessment: 68 y/o male presenting with chest pain, found to have acute, right-sided segmental and sub-segmental pulmonary emboli. PMH significant for tobacco use, CAD, prior pulmonary embolism not on anticoagulation, hypertension, depression, recent lobectomy on 03/31/2024. Pharmacy has been consulted to initiate and manage heparin infusion.  Baseline labs: hgb 14.4, plt 228, INR 0.9  Goal of Therapy:  Heparin level 0.3-0.7 units/ml Monitor platelets by anticoagulation protocol: Yes   Plan: heparin level therapeutic x 1 ---continue heparin infusion at 1150  units/hr ---Recheck heparin level in 6 hrs to confirm ---CBC daily while on heparin  Adriana Bolster, PharmD, BCPS  04/23/2024 9:30 AM

## 2024-04-23 NOTE — Plan of Care (Signed)

## 2024-04-24 ENCOUNTER — Ambulatory Visit
Payer: Self-pay | Attending: Thoracic Surgery (Cardiothoracic Vascular Surgery) | Admitting: Thoracic Surgery (Cardiothoracic Vascular Surgery)

## 2024-04-24 ENCOUNTER — Ambulatory Visit (HOSPITAL_COMMUNITY)
Admission: RE | Admit: 2024-04-24 | Discharge: 2024-04-24 | Disposition: A | Discharge status: Home / Self Care | Source: Ambulatory Visit | Attending: Cardiovascular Disease | Admitting: Cardiovascular Disease

## 2024-04-24 ENCOUNTER — Encounter: Payer: Self-pay | Admitting: Thoracic Surgery (Cardiothoracic Vascular Surgery)

## 2024-04-24 VITALS — BP 127/90 | HR 60 | Resp 20 | Ht 71.0 in | Wt 196.0 lb

## 2024-04-24 DIAGNOSIS — Z9889 Other specified postprocedural states: Secondary | ICD-10-CM | POA: Insufficient documentation

## 2024-04-24 DIAGNOSIS — C7A090 Malignant carcinoid tumor of the bronchus and lung: Secondary | ICD-10-CM

## 2024-04-24 DIAGNOSIS — J90 Pleural effusion, not elsewhere classified: Secondary | ICD-10-CM | POA: Diagnosis not present

## 2024-04-24 DIAGNOSIS — R911 Solitary pulmonary nodule: Secondary | ICD-10-CM | POA: Insufficient documentation

## 2024-04-24 DIAGNOSIS — I517 Cardiomegaly: Secondary | ICD-10-CM | POA: Diagnosis not present

## 2024-04-24 DIAGNOSIS — D1430 Benign neoplasm of unspecified bronchus and lung: Secondary | ICD-10-CM | POA: Diagnosis not present

## 2024-04-24 DIAGNOSIS — Z09 Encounter for follow-up examination after completed treatment for conditions other than malignant neoplasm: Secondary | ICD-10-CM

## 2024-04-24 DIAGNOSIS — R918 Other nonspecific abnormal finding of lung field: Secondary | ICD-10-CM | POA: Diagnosis not present

## 2024-04-24 NOTE — Progress Notes (Signed)
 [address] [place] [address]             [phone]     HPI: Mr. Corbit returns for follow-up after recent left lower lobectomy.  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  Diagnosed with a left lower lobe mass earlier this year.  Was found on a low-dose CT for lung cancer screening.  Hypermetabolic on PET.  Navigational bronchoscopy showed atypical carcinoid tumor.  He underwent a robotic assisted left lower lobectomy on 03/31/2024.  Postoperative course was unremarkable and went home on day 2.  He was on low-dose Lovenox  postoperatively.  Presented back to the emergency room a few days ago with left-sided chest pain after sudden movement.  CT angiogram of the chest was done which showed some small subsegmental pulmonary emboli on the right.  There was a left pleural effusion as well.  He feels well.  Did not have any shortness of breath or leg swelling.  No pain over the past few days.  Has not taken a pain pill since before going to the emergency room.  Past Medical History:  Diagnosis Date   Anxiety    Arthritis    Coronary artery disease    Depression 02/15/2014   Hypertension    Myocardial infarction [facility])    NSTEMI   PE (pulmonary thromboembolism) (HCC) 09/09/2015   Pre-diabetes    Pulmonary embolism (HCC)    Sleep disturbance 02/15/2014   Tremors of nervous system    Trochanteric bursitis of left hip 08/30/2018    Current Outpatient Medications  Medication Sig Dispense Refill   acetaminophen  (TYLENOL ) 325 MG tablet Take 2 tablets (650 mg total) by mouth every 6 (six) hours as needed for mild pain (pain score 1-3).     amLODipine  (NORVASC ) 5 MG tablet Take 5 mg by mouth daily.     apixaban  (ELIQUIS ) 5 MG TABS tablet Take 1 tablet (5 mg total) by mouth 2 (two) times daily. Start AFTER  finishing the starter pack 180 tablet 1   APIXABAN  (ELIQUIS ) VTE STARTER PACK (10MG  AND 5MG ) Take as directed on package: start with two-5mg  tablets twice daily for 7 days. On day 8, switch to one-5mg  tablet twice daily. 74 each 0   buPROPion  (WELLBUTRIN  SR) 150 MG 12 hr tablet Take 150 mg by mouth 2 (two) times daily.     gabapentin  (NEURONTIN ) 300 MG capsule Take 300 mg by mouth 3 (three) times daily.     nicotine  (NICODERM CQ  - DOSED IN MG/24 HOURS) 21 mg/24hr patch Place 1 patch (21 mg total) onto the skin daily. 90 patch 0   omeprazole  (PRILOSEC) 20 MG capsule Take 1 capsule (20 mg total) by mouth daily before breakfast. 30 capsule 3   pravastatin  (PRAVACHOL ) 40 MG tablet Take 40 mg by mouth daily.     propranolol  ER (INDERAL  LA) 60 MG 24 hr capsule Take 1 capsule (60 mg total) by mouth daily. For tremor 30 capsule 1   traMADol  (ULTRAM ) 50 MG tablet Take 1 tablet (50 mg total) by mouth every 6 (six) hours as needed for severe pain (pain score 7-10). 28 tablet 0   No current facility-administered medications for this visit.    Physical Exam BP (!) 127/90   Pulse 60   Resp 20   Ht 5' 11 (1.803  m)   Wt 196 lb (88.9 kg)   SpO2 99% Comment: RA  BMI 27.66 kg/m  68 year old man in no acute distress Alert and oriented x 3 with no focal deficits Incisions healing well Lungs diminished breath sounds at left base but otherwise clear Cardiac regular rate and rhythm  Diagnostic Tests: 2 VIEW(S) XRAY OF THE CHEST 04/24/2024 02:27:02 PM   COMPARISON: 04/22/2024   CLINICAL HISTORY: carcinoid bronchial adenoma   FINDINGS:   LUNGS AND PLEURA: Retrocardiac opacity which, when correlated with the 04/22/2024 CT, is felt to represent a combination of volume loss and loculated pleural fluid. Clear right lung. No pneumothorax.   HEART AND MEDIASTINUM: Mild cardiomegaly.   BONES AND SOFT TISSUES: No acute osseous abnormality.       IMPRESSION: 1. No significant change in appearance  of the chest since 04/22/2024. Retrocardiac opacity favored to represent a combination of volume loss and loculated pleural fluid, unchanged from 04/22/2024. 2. Mild cardiomegaly.   Electronically signed by: Rockey Kilts MD 04/24/2024 03:18 PM EST RP Workstation: [id number]  CT ANGIOGRAPHY CHEST WITH CONTRAST   TECHNIQUE: Multidetector CT imaging of the chest was performed using the standard protocol during bolus administration of intravenous contrast. Multiplanar CT image reconstructions and MIPs were obtained to evaluate the vascular anatomy.   RADIATION DOSE REDUCTION: This exam was performed according to the departmental dose-optimization program which includes automated exposure control, adjustment of the mA and/or kV according to patient size and/or use of iterative reconstruction technique.   CONTRAST:  75mL OMNIPAQUE  IOHEXOL  350 MG/ML SOLN   COMPARISON:  Chest CT 02/03/2024. PET-CT 04/17/2024.   FINDINGS: Cardiovascular: There are right lower lobe segmental and subsegmental pulmonary emboli. No large central pulmonary embolism. Heart and aorta are normal in size. There is no pericardial effusion.   Mediastinum/Nodes: There are mildly enlarged right hilar lymph nodes measuring up to 10 mm similar to prior. There are additional nonenlarged bilateral hilar and mediastinal lymph nodes. The visualized thyroid gland and esophagus are within normal limits.   Lungs/Pleura: There is a moderate-sized left pleural effusion which appears unchanged. Patient is status post resection of left lower lobe nodule. There is mild atelectasis in the right lower lobe. There is no new focal lung infiltrate or pneumothorax. Mild emphysema present.   Upper Abdomen: Hepatic and renal cysts are unchanged.   Musculoskeletal: Bilateral gynecomastia again noted. No acute or significant osseous findings.   Review of the MIP images confirms the above findings.   IMPRESSION: 1. Right lower  lobe segmental and subsegmental pulmonary emboli. 2. Stable moderate-sized left pleural effusion. 3. Stable postsurgical changes in the left lower lobe. 4. Stable mild right hilar lymphadenopathy. 5. Emphysema.   Emphysema (ICD10-J43.9).     Electronically Signed   By: Greig Pique M.D.   On: 04/22/2024 15:22 I personally reviewed the chest x-ray images from today.  Normal postop appearance following left lower lobectomy.  Also reviewed the CT images from 1129 which showed subsegmental pulmonary emboli on the right.  Also postoperative changes.  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  Atypical carcinoid tumor-status post left lower lobectomy.  All lymph nodes were negative.  No need for any additional therapy.  Will need follow-up.  Will refer to Dr. Sherrod.  Pulmonary embolus-presented to the ED with left-sided chest pain likely due to some movement against resistance.  Fortunately a CT was done which showed segmental and subsegmental emboli on the right side.  Has been started on apixaban .  No DVT by duplex.  From a surgical standpoint, other than the pulmonary embolus of course, he is doing well.  He has minimal discomfort.  [name] not having any respiratory issues.  He does have some effusion on the left which is common after lobectomy as the other lobe often cannot fill the entire chest base.  Will plan to get another chest x-ray in about a month to make sure that is not increasing in size.  May drive.  He may resume other activities gradually as tolerated.  Plan: Will refer to Dr. Sherrod for consultation regarding stage Ia atypical carcinoid tumor and also recurrent provoked pulmonary embolus  Return in 1 month with PA lateral chest x-ray to check on progress  [name], MD [facility] [phone]

## 2024-04-25 ENCOUNTER — Encounter: Payer: Self-pay | Admitting: Oncology

## 2024-04-25 ENCOUNTER — Encounter: Payer: Self-pay | Admitting: *Deleted

## 2024-04-25 ENCOUNTER — Inpatient Hospital Stay: Attending: Oncology | Admitting: Oncology

## 2024-04-25 VITALS — BP 126/102 | HR 69 | Temp 98.0°F | Resp 20 | Wt 196.0 lb

## 2024-04-25 DIAGNOSIS — Z85110 Personal history of malignant carcinoid tumor of bronchus and lung: Secondary | ICD-10-CM | POA: Diagnosis present

## 2024-04-25 DIAGNOSIS — F17210 Nicotine dependence, cigarettes, uncomplicated: Secondary | ICD-10-CM | POA: Diagnosis not present

## 2024-04-25 DIAGNOSIS — C7A8 Other malignant neuroendocrine tumors: Secondary | ICD-10-CM

## 2024-04-25 DIAGNOSIS — Z86711 Personal history of pulmonary embolism: Secondary | ICD-10-CM | POA: Diagnosis not present

## 2024-04-25 DIAGNOSIS — Z720 Tobacco use: Secondary | ICD-10-CM | POA: Insufficient documentation

## 2024-04-25 DIAGNOSIS — R053 Chronic cough: Secondary | ICD-10-CM | POA: Insufficient documentation

## 2024-04-25 DIAGNOSIS — Z79899 Other long term (current) drug therapy: Secondary | ICD-10-CM | POA: Diagnosis not present

## 2024-04-25 DIAGNOSIS — D3A090 Benign carcinoid tumor of the bronchus and lung: Secondary | ICD-10-CM | POA: Diagnosis not present

## 2024-04-25 DIAGNOSIS — Z7901 Long term (current) use of anticoagulants: Secondary | ICD-10-CM | POA: Diagnosis not present

## 2024-04-25 DIAGNOSIS — Z902 Acquired absence of lung [part of]: Secondary | ICD-10-CM | POA: Diagnosis not present

## 2024-04-25 DIAGNOSIS — I2699 Other pulmonary embolism without acute cor pulmonale: Secondary | ICD-10-CM | POA: Diagnosis not present

## 2024-04-25 NOTE — Assessment & Plan Note (Signed)
 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  Status post lobectomy. No role of adjuvant treatments. Recommend surveillance.

## 2024-04-25 NOTE — Assessment & Plan Note (Signed)
 Recommend smoke cessation.

## 2024-04-25 NOTE — Progress Notes (Signed)
 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  Status post lobectomy. No role of adjuvant treatments. Recommend surveillance.  Acute pulmonary embolism without acute cor pulmonale (HCC) This is likely provoked secondary to recent hospitalization/immobilization due to surgery. Recommend patient to continue Eliquis  5 mg twice daily, plan 6 months of anticoagulation.  Tobacco use Recommend smoke cessation    Orders Placed This Encounter  Procedures   CBC with Differential (Cancer Center Only)    Standing Status:   Future    Expected Date:   07/24/2024    Expiration Date:   10/22/2024   CMP (Cancer Center only)    Standing Status:   Future    Expected Date:   07/24/2024    Expiration Date:   10/22/2024   Patient will follow-up after biopsy is done. All questions were answered. The patient knows to call the clinic with any problems, questions or concerns.  Zelphia Cap, MD, PhD [facility] Hematology Oncology 04/25/2024    HISTORY OF PRESENTING ILLNESS:  Jesse [name] 68 y.o. male presents to establish care for left lung well differentiated neuroendocrine carcinoma  Oncology History  Neuroendocrine carcinoma of lung (HCC) (Resolved)  02/03/2024 Imaging   CT chest lung cancer screening low-dose  1. 25.4 mm left lower lobe nodule with distal mucoid impaction. Lung-RADS 4B, suspicious. Additional imaging evaluation or consultation with Pulmonology or Thoracic Surgery recommended. These results will be called to the ordering clinician or representative by the Radiologist Assistant, and communication documented in the PACS or [employer]. 2. Mild cylindrical  bronchiectasis. 3. Three-vessel coronary artery calcification. 4.  Emphysema   03/31/2024 Initial Diagnosis   Neuroendocrine carcinoma of lung (HCC)  03/06/2024 biopsy.  Bronchoscopy  A.  LEFT LUNG, LOWER LOBE, NODULE, FINE NEEDLE ASPIRATION:  - Malignant  - Well-differentiated neuroendocrine tumor, favor G2 (atypical  carcinoid)  B. LYMPH NODE, STATION 7, FINE NEEDLE ASPIRATION:  - Negative for malignancy  - Consistent with a benign lymph node    03/31/2024 Surgery   A. LUNG, LEFT LOWER LOBE, LOBECTOMY: - Well-differentiated neuroendocrine tumor (G2: Atypical carcinoid), 2.5 cm - Resection margins are negative for tumor - Pleural surface is not involved - No evidence of lymphovascular invasion - Six lymph nodes, negative for tumor (0/6) - See oncology table - See comment  B. LYMPH NODE, 9, EXCISION: - Lymph node, negative for carcinoma (0/1)  C. LYMPH NODE, 8, EXCISION: - Lymph node, negative for carcinoma (0/1)  D. LYMPH NODE, 10, EXCISION: - Lymph node, negative for carcinoma (0/1)  E. LYMPH NODE, 7, EXCISION: - Lymph node, negative for carcinoma (0/1)  F. LYMPH NODE, 7 #2, EXCISION: - Lymph node, negative for carcinoma (0/1)  G. LYMPH NODE, 10 #2, EXCISION: - Lymph node, negative for carcinoma (0/1)  H. LYMPH NODE, 10 #3, EXCISION: - Lymph node, negative for carcinoma (0/1)  I. LYMPH NODE, 12, EXCISION: - Lymph node, negative for carcinoma (0/1)  J. LYMPH NODE, 11, EXCISION:  - Lymph node, negative for carcinoma (0/1)   K. LYMPH NODE, 13, EXCISION:  - Lymph node, negative for carcinoma (0/1)   L. LYMPH NODE, 12 #2, EXCISION:  - Lymph node, negative for carcinoma (0/1)   M. LYMPH NODE, 13 #2, EXCISION:  - Lymph node, negative for  carcinoma (0/1)   The atypical carcinoid shows a patchy mucinous stroma (confirmed with  mucicarmine stain).  There is no evidence of any adenocarcinoma  component within the tumor    Synchronous Tumors: Not  applicable Total Number of Primary Tumors: 1 Procedure: Lobectomy, lung Specimen Laterality: Left Tumor Focality: Unifocal Tumor Site: Lower lobe Tumor Size: 2.5 cm Histologic Type: Atypical carcinoid / Neuroendocrine tumor, grade 2 Visceral Pleura Invasion: Not identified Direct Invasion of Adjacent Structures: No adjacent structures present Lymphovascular Invasion: Not identified Margins: All margins negative for tumor      Closest Margin(s) to tumor: Vascular margin, 2 cm      Margin(s) Involved by tumor: Not applicable      Margin Status for Non-Invasive Tumor: Not applicable Treatment Effect: No known presurgical therapy Regional Lymph Nodes:      Number of Lymph Nodes Involved: 0                           Nodal Sites with Tumor: Not applicable      Number of Lymph Nodes Examined: 18                      Nodal Sites Examined: Levels 7, 8, 9, 10, 11, 12 and 13 Distant Metastasis:      Distant Site(s) Involved: Not applicable Pathologic Stage Classification (pTNM, AJCC 8th Edition): pT1c, pN0    04/17/2024 Imaging   Dotatate PET scan  1. No DOTATATE-avid disease. 2. Interval resection of the left lobe pulmonary nodule with associated left lung atelectasis and moderate left pleural effusion.   04/22/2024 Imaging   CT chest angiogram showed  1. Right lower lobe segmental and subsegmental pulmonary emboli. 2. Stable moderate-sized left pleural effusion. 3. Stable postsurgical changes in the left lower lobe. 4. Stable mild right hilar lymphadenopathy. 5. Emphysema   04/22/2024 Imaging   Bilateral lower extremity venous ultrasound showed no DVT   04/25/2024 Cancer Staging   Staging form: Lung, AJCC 7th Edition - Clinical: T1, N0, M0 - Signed by Babara Call, MD on 04/25/2024 Stage prefix: Initial diagnosis Source of metastatic specimen: Lung Histologic grade (G): G2 Lymph-vascular invasion (LVI): LVI not present (absent)/not identified Residual tumor (R):  R0 Pleural/elastic layer invasion: PL0      Patient reports feeling well today.  He has extensive smoking history.  Recently he has been working on cutting back on smoking.  Some chronic cough.  Denies any hemoptysis.  No unintentional weight loss, fever, chills, night sweats, fatigue.  03/31/2024 status post lobectomy with  lymph node dissection. 04/22/2024 - 04/23/2024, patient was hospitalized due to left-sided chest pain.  Workup showed multiple right lower lobe segmental and subsegmental pulmonary emboli.  Stable moderate size left pleural effusion.  Stable mild right hilar lymphadenopathy. Patient was started on anticoagulation with heparin and switched to Eliquis  at discharge.  Patient denies bleeding events.  He feels well today.  No shortness of breath or chest pain  Patient reports a remote history of pulmonary embolism diagnosed in 2017- CTA chest revealed a very small pulmonary embolus in the posterior basal segment of the right lower lobe and more prominently, in the anterior segment of the right upper lobe. No other PE  Per note patient was treated with Xarelto.  Patient cannot remember how long he took anticoagulation.  MEDICAL HISTORY:  Past Medical History:  Diagnosis Date   Anxiety    Arthritis    Coronary artery  disease    Depression 02/15/2014   Hypertension    Myocardial infarction [facility])    NSTEMI   PE (pulmonary thromboembolism) (HCC) 09/09/2015   Pre-diabetes    Pulmonary embolism (HCC)    Sleep disturbance 02/15/2014   Tremors of nervous system    Trochanteric bursitis of left hip 08/30/2018    SURGICAL HISTORY: Past Surgical History:  Procedure Laterality Date   BRONCHIAL NEEDLE ASPIRATION BIOPSY  03/06/2024   Procedure: BRONCHOSCOPY, WITH NEEDLE ASPIRATION BIOPSY;  Surgeon: Shelah Lamar RAMAN, MD;  Location: [facility] ENDOSCOPY;  Service: Pulmonary;;   CRYOTHERAPY  03/06/2024   Procedure: CRYOTHERAPY;  Surgeon: Shelah Lamar RAMAN, MD;  Location: [facility] ENDOSCOPY;   Service: Pulmonary;;   ENDOBRONCHIAL ULTRASOUND Left 03/06/2024   Procedure: ENDOBRONCHIAL ULTRASOUND (EBUS);  Surgeon: Shelah Lamar RAMAN, MD;  Location: [facility] ENDOSCOPY;  Service: Pulmonary;  Laterality: Left;   FRACTURE SURGERY Right    pin placed on right leg   FUDUCIAL PLACEMENT  03/06/2024   Procedure: INSERTION, FIDUCIAL MARKER, GOLD;  Surgeon: Shelah Lamar RAMAN, MD;  Location: [facility] ENDOSCOPY;  Service: Pulmonary;;   INTERCOSTAL NERVE BLOCK Left 03/31/2024   Procedure: BLOCK, NERVE, INTERCOSTAL;  Surgeon: Kerrin Elspeth BROCKS, MD;  Location: [facility] OR;  Service: Thoracic;  Laterality: Left;   LOBECTOMY, LUNG, ROBOT-ASSISTED, USING VATS Left 03/31/2024   Procedure: LOBECTOMY, LUNG, ROBOT-ASSISTED, USING VATS;  Surgeon: Kerrin Elspeth BROCKS, MD;  Location: [facility] OR;  Service: Thoracic;  Laterality: Left;  Robotic left lower lobectomy   SCALENE NODE BIOPSY Left 03/31/2024   Procedure: BIOPSY, LYMPH NODE;  Surgeon: Kerrin Elspeth BROCKS, MD;  Location: [facility] OR;  Service: Thoracic;  Laterality: Left;   TOTAL HIP ARTHROPLASTY Bilateral    VIDEO BRONCHOSCOPY WITH ENDOBRONCHIAL NAVIGATION Left 03/06/2024   Procedure: VIDEO BRONCHOSCOPY WITH ENDOBRONCHIAL NAVIGATION;  Surgeon: Shelah Lamar RAMAN, MD;  Location: [facility] ENDOSCOPY;  Service: Pulmonary;  Laterality: Left;  Left lower lobe    SOCIAL HISTORY: Social History   Socioeconomic History   Marital status: Divorced    Spouse name: Not on file   Number of children: Not on file   Years of education: Not on file   Highest education level: Some college, no degree  Occupational History   Not on file  Tobacco Use   Smoking status: Every Day    Current packs/day: 0.65    Average packs/day: 0.6 packs/day for 36.9 years (24.0 ttl pk-yrs)    Types: Cigarettes    Start date: 1989   Smokeless tobacco: Never   Tobacco comments:    trying to quit smoking,1 cigarettes a day KRD 03/13/2024-1 pack lasts about a week   Vaping Use   Vaping status: Never Used  Substance  and Sexual Activity   Alcohol use: No   Drug use: No   Sexual activity: Not Currently  Other Topics Concern   Not on file  Social History Narrative   Not on file   Social Drivers of Health   Financial Resource Strain: High Risk (03/24/2024)   Overall Financial Resource Strain (CARDIA)    Difficulty of Paying Living Expenses: Very hard  Food Insecurity: Food Insecurity Present (04/23/2024)   Hunger Vital Sign    Worried About Running Out of Food in the Last Year: Often true    Ran Out of Food in the Last Year: Often true  Transportation Needs: Unmet Transportation Needs (04/23/2024)   PRAPARE - Administrator, Civil Service (Medical): Yes    Lack of Transportation (Non-Medical): Yes  Physical  Activity: Inactive (03/24/2024)   Exercise Vital Sign    Days of Exercise per Week: 0 days    Minutes of Exercise per Session: Not on file  Stress: Stress Concern Present (03/24/2024)   [employer] of Occupational Health - Occupational Stress Questionnaire    Feeling of Stress: Very much  Social Connections: Socially Isolated (04/23/2024)   Social Connection and Isolation Panel    Frequency of Communication with Friends and Family: Once a week    Frequency of Social Gatherings with Friends and Family: Never    Attends Religious Services: Never    Database Administrator or Organizations: No    Attends Banker Meetings: Never    Marital Status: Divorced  Catering Manager Violence: Not At Risk (04/23/2024)   Humiliation, Afraid, Rape, and Kick questionnaire    Fear of Current or Ex-Partner: No    Emotionally Abused: No    Physically Abused: No    Sexually Abused: No    FAMILY HISTORY: Family History  Problem Relation Age of Onset   Diabetes Mother    Alzheimer's disease Mother    Gout Mother    Hyperlipidemia Mother    Hypertension Mother    Diabetes Father    Coronary artery disease Father 44       stent   Heart disease Father    Hypertension  Father    Healthy Sister    Healthy Sister    Healthy Sister    Lung disease Brother    Healthy Brother    Healthy Brother    Healthy Daughter    Healthy Son    Breast cancer Neg Hx     ALLERGIES:  is allergic to bactrim [sulfamethoxazole-trimethoprim] and penicillins.  MEDICATIONS:  Current Outpatient Medications  Medication Sig Dispense Refill   acetaminophen  (TYLENOL ) 325 MG tablet Take 2 tablets (650 mg total) by mouth every 6 (six) hours as needed for mild pain (pain score 1-3).     amLODipine  (NORVASC ) 5 MG tablet Take 5 mg by mouth daily.     APIXABAN  (ELIQUIS ) VTE STARTER PACK (10MG  AND 5MG ) Take as directed on package: start with two-5mg  tablets twice daily for 7 days. On day 8, switch to one-5mg  tablet twice daily. 74 each 0   buPROPion  (WELLBUTRIN  SR) 150 MG 12 hr tablet Take 150 mg by mouth 2 (two) times daily.     gabapentin  (NEURONTIN ) 300 MG capsule Take 300 mg by mouth 3 (three) times daily.     nicotine  (NICODERM CQ  - DOSED IN MG/24 HOURS) 21 mg/24hr patch Place 1 patch (21 mg total) onto the skin daily. 90 patch 0   omeprazole  (PRILOSEC) 20 MG capsule Take 1 capsule (20 mg total) by mouth daily before breakfast. 30 capsule 3   pravastatin  (PRAVACHOL ) 40 MG tablet Take 40 mg by mouth daily.     propranolol  ER (INDERAL  LA) 60 MG 24 hr capsule Take 1 capsule (60 mg total) by mouth daily. For tremor 30 capsule 1   traMADol  (ULTRAM ) 50 MG tablet Take 1 tablet (50 mg total) by mouth every 6 (six) hours as needed for severe pain (pain score 7-10). 28 tablet 0   apixaban  (ELIQUIS ) 5 MG TABS tablet Take 1 tablet (5 mg total) by mouth 2 (two) times daily. Start AFTER finishing the starter pack 180 tablet 1   No current facility-administered medications for this visit.    Review of Systems  Constitutional:  Negative for appetite change, chills, fatigue and fever.  HENT:   Negative for hearing loss and voice change.   Eyes:  Negative for eye problems.  Respiratory:  Negative  for chest tightness and cough.   Cardiovascular:  Negative for chest pain.  Gastrointestinal:  Negative for abdominal distention, abdominal pain and blood in stool.  Endocrine: Negative for hot flashes.  Genitourinary:  Negative for difficulty urinating and frequency.   Musculoskeletal:  Negative for arthralgias.  Skin:  Negative for itching and rash.  Neurological:  Negative for extremity weakness.  Hematological:  Negative for adenopathy.  Psychiatric/Behavioral:  Negative for confusion.      PHYSICAL EXAMINATION: ECOG PERFORMANCE STATUS: 1 - Symptomatic but completely ambulatory  Vitals:   04/25/24 1523 04/25/24 1529  BP: (!) 135/102 (!) 126/102  Pulse: 69   Resp: 20   Temp: 98 F (36.7 C)   SpO2: 100%    Filed Weights   04/25/24 1523  Weight: 196 lb (88.9 kg)    Physical Exam Constitutional:      General: [name] not in acute distress.    Appearance: [name] not diaphoretic.  HENT:     Head: Normocephalic and atraumatic.  Eyes:     General: No scleral icterus. Cardiovascular:     Rate and Rhythm: Normal rate and regular rhythm.  Pulmonary:     Effort: Pulmonary effort is normal. No respiratory distress.     Breath sounds: No wheezing.     Comments: Decreased breath sound left lung Abdominal:     General: There is no distension.     Palpations: Abdomen is soft.     Tenderness: There is no abdominal tenderness.  Musculoskeletal:        General: Normal range of motion.     Cervical back: Normal range of motion and neck supple.  Skin:    General: Skin is warm and dry.     Findings: No erythema.  Neurological:     Mental Status: [name] alert and oriented to person, place, and time. Mental status is at baseline.     Motor: No abnormal muscle tone.  Psychiatric:        Mood and Affect: Mood and affect normal.      LABORATORY DATA:  I have reviewed the data as listed    Latest Ref Rng & Units 04/23/2024    5:12 AM 04/22/2024   12:50 PM 04/02/2024    3:46 AM   CBC  WBC 4.0 - 10.5 K/uL 7.6  6.9  13.7   Hemoglobin 13.0 - 17.0 g/dL 87.3  85.5  86.9   Hematocrit 39.0 - 52.0 % 36.8  42.8  37.9   Platelets 150 - 400 K/uL 224  228  211       Latest Ref Rng & Units 04/23/2024    5:12 AM 04/22/2024    2:33 PM 04/22/2024   12:50 PM  CMP  Glucose 70 - 99 mg/dL 96   889   BUN 8 - 23 mg/dL 10   7   Creatinine 9.38 - 1.24 mg/dL 8.87   9.01   Sodium 864 - 145 mmol/L 138   139   Potassium 3.5 - 5.1 mmol/L 4.0   4.1   Chloride 98 - 111 mmol/L 104   105   CO2 22 - 32 mmol/L 27   26   Calcium  8.9 - 10.3 mg/dL 9.0   9.3   Total Protein 6.5 - 8.1 g/dL  7.5    Total Bilirubin 0.0 - 1.2 mg/dL  0.4    Alkaline Phos 38 - 126 U/L  68    AST 15 - 41 U/L  18    ALT 0 - 44 U/L  7       RADIOGRAPHIC STUDIES: I have personally reviewed the radiological images as listed and agreed with the findings in the report. DG Chest 2 View Result Date: 04/24/2024 EXAM: 2 VIEW(S) XRAY OF THE CHEST 04/24/2024 02:27:02 PM COMPARISON: 04/22/2024 CLINICAL HISTORY: carcinoid bronchial adenoma FINDINGS: LUNGS AND PLEURA: Retrocardiac opacity which, when correlated with the 04/22/2024 CT, is felt to represent a combination of volume loss and loculated pleural fluid. Clear right lung. No pneumothorax. HEART AND MEDIASTINUM: Mild cardiomegaly. BONES AND SOFT TISSUES: No acute osseous abnormality. IMPRESSION: 1. No significant change in appearance of the chest since 04/22/2024. Retrocardiac opacity favored to represent a combination of volume loss and loculated pleural fluid, unchanged from 04/22/2024. 2. Mild cardiomegaly. Electronically signed by: Rockey Kilts MD 04/24/2024 03:18 PM EST RP Workstation: [id number]   US  Venous Img Lower Bilateral (DVT) Result Date: 04/22/2024 CLINICAL DATA:  Pulmonary embolism. EXAM: BILATERAL LOWER EXTREMITY VENOUS DOPPLER ULTRASOUND TECHNIQUE: Gray-scale sonography with graded compression, as well as color Doppler and duplex ultrasound were performed to  evaluate the lower extremity deep venous systems from the level of the common femoral vein and including the common femoral, femoral, profunda femoral, popliteal and calf veins including the posterior tibial, peroneal and gastrocnemius veins when visible. The superficial great saphenous vein was also interrogated. Spectral Doppler was utilized to evaluate flow at rest and with distal augmentation maneuvers in the common femoral, femoral and popliteal veins. COMPARISON:  None Available. FINDINGS: RIGHT LOWER EXTREMITY Common Femoral Vein: No evidence of thrombus. Normal compressibility, respiratory phasicity and response to augmentation. Saphenofemoral Junction: No evidence of thrombus. Normal compressibility and flow on color Doppler imaging. Profunda Femoral Vein: No evidence of thrombus. Normal compressibility and flow on color Doppler imaging. Femoral Vein: No evidence of thrombus. Normal compressibility, respiratory phasicity and response to augmentation. Popliteal Vein: No evidence of thrombus. Normal compressibility, respiratory phasicity and response to augmentation. Calf Veins: No evidence of thrombus. Normal compressibility and flow on color Doppler imaging. Superficial Great Saphenous Vein: No evidence of thrombus. Normal compressibility. Venous Reflux:  None. Other Findings:  None. LEFT LOWER EXTREMITY Common Femoral Vein: No evidence of thrombus. Normal compressibility, respiratory phasicity and response to augmentation. Saphenofemoral Junction: No evidence of thrombus. Normal compressibility and flow on color Doppler imaging. Profunda Femoral Vein: No evidence of thrombus. Normal compressibility and flow on color Doppler imaging. Femoral Vein: No evidence of thrombus. Normal compressibility, respiratory phasicity and response to augmentation. Popliteal Vein: No evidence of thrombus. Normal compressibility, respiratory phasicity and response to augmentation. Calf Veins: No evidence of thrombus. Normal  compressibility and flow on color Doppler imaging. Superficial Great Saphenous Vein: No evidence of thrombus. Normal compressibility. Venous Reflux:  None. Other Findings:  None. IMPRESSION: No evidence of deep venous thrombosis in either lower extremity. Electronically Signed   By: Suzen Dials M.D.   On: 04/22/2024 18:01   CT Angio Chest Pulmonary Embolism (PE) W or WO Contrast Result Date: 04/22/2024 CLINICAL DATA:  High probability for PE.  Left-sided chest pain. EXAM: CT ANGIOGRAPHY CHEST WITH CONTRAST TECHNIQUE: Multidetector CT imaging of the chest was performed using the standard protocol during bolus administration of intravenous contrast. Multiplanar CT image reconstructions and MIPs were obtained to evaluate the vascular anatomy. RADIATION DOSE REDUCTION: This exam was performed according to the departmental dose-optimization program  which includes automated exposure control, adjustment of the mA and/or kV according to patient size and/or use of iterative reconstruction technique. CONTRAST:  75mL OMNIPAQUE  IOHEXOL  350 MG/ML SOLN COMPARISON:  Chest CT 02/03/2024. PET-CT 04/17/2024. FINDINGS: Cardiovascular: There are right lower lobe segmental and subsegmental pulmonary emboli. No large central pulmonary embolism. Heart and aorta are normal in size. There is no pericardial effusion. Mediastinum/Nodes: There are mildly enlarged right hilar lymph nodes measuring up to 10 mm similar to prior. There are additional nonenlarged bilateral hilar and mediastinal lymph nodes. The visualized thyroid gland and esophagus are within normal limits. Lungs/Pleura: There is a moderate-sized left pleural effusion which appears unchanged. Patient is status post resection of left lower lobe nodule. There is mild atelectasis in the right lower lobe. There is no new focal lung infiltrate or pneumothorax. Mild emphysema present. Upper Abdomen: Hepatic and renal cysts are unchanged. Musculoskeletal: Bilateral gynecomastia  again noted. No acute or significant osseous findings. Review of the MIP images confirms the above findings. IMPRESSION: 1. Right lower lobe segmental and subsegmental pulmonary emboli. 2. Stable moderate-sized left pleural effusion. 3. Stable postsurgical changes in the left lower lobe. 4. Stable mild right hilar lymphadenopathy. 5. Emphysema. Emphysema (ICD10-J43.9). Electronically Signed   By: Greig Pique M.D.   On: 04/22/2024 15:22   DG Chest 2 View Result Date: 04/22/2024 EXAM: 2 VIEW(S) XRAY OF THE CHEST 04/22/2024 12:39:25 PM COMPARISON: 04/02/2024 CLINICAL HISTORY: FINDINGS: LUNGS AND PLEURA: Mild left basilar atelectasis or infiltrate is noted with minimal left pleural effusion. No pneumothorax. HEART AND MEDIASTINUM: No acute abnormality of the cardiac and mediastinal silhouettes. BONES AND SOFT TISSUES: No acute osseous abnormality. IMPRESSION: 1. Mild left basilar atelectasis or infiltrate with minimal left pleural effusion. Electronically signed by: Lynwood Seip MD 04/22/2024 12:59 PM EST RP Workstation: [id number]   NM PET DOTATATE SKULL BASE TO MID THIGH Result Date: 04/18/2024 EXAM: PET AND CT SKULL BASE TO MID THIGH 02/21/2024 04:33:20 PM TECHNIQUE: RADIOPHARMACEUTICAL: 3.7 mCi copper -64 DOTATATE Uptake time 60 minutes. PET imaging was acquired from the base of the skull to the mid thighs. Non-contrast enhanced computed tomography was obtained for attenuation correction and anatomic localization. COMPARISON: None available. CLINICAL HISTORY: carcinoid staging FINDINGS: HEAD AND NECK: Physiologic activity within the pituitary gland, salivary glands, and thyroid gland. No DOTATATE-avid cervical lymphadenopathy. CHEST: Interval resection of the nodule. There is a moderate layering left pleural effusion and left lung atelectasis following surgery. There are no radiotracer avid pulmonary nodules present in the left and right lung. No radiotracer avid mediastinal lymphadenopathy. ABDOMEN AND  PELVIS: Physiologic activity within the liver, spleen, adrenal glands, kidneys, and bowel. Bilateral nonhepatic cysts are noted. Bilateral benign renal cysts. No abnormal activity in the liver or pancreas. No abnormal activity in the bowel. No DOTATATE-avid intraperitoneal mass or lymphadenopathy. BONES AND SOFT TISSUE: Skeletal metastases. IMPRESSION: 1. No DOTATATE-avid disease. 2. Interval resection of the left lobe pulmonary nodule with associated left lung atelectasis and moderate left pleural effusion. Electronically signed by: Norleen Boxer MD 04/18/2024 10:31 AM EST RP Workstation: [id number]   DG Chest 2 View Result Date: 04/02/2024 CLINICAL DATA:  Pneumothorax. EXAM: CHEST - 2 VIEW COMPARISON:  Radiograph yesterday FINDINGS: No visualized pneumothorax. Increasing retrocardiac opacity. Subsegmental opacities in the periphery of the left lung base, as well as right lung base. Overall low lung volumes. Stable heart size and mediastinal contours. Possible left pleural effusion. IMPRESSION: 1. No visualized pneumothorax. 2. Increasing retrocardiac opacity may represent atelectasis, developing airspace disease or loculated pleural  fluid. 3. Additional subsegmental opacities in the periphery of the left lung base, as well as right lung base, likely atelectasis. Electronically Signed   By: Andrea Gasman M.D.   On: 04/02/2024 12:11   DG Chest Port 1 View Result Date: 04/01/2024 EXAM: 1 VIEW(S) XRAY OF THE CHEST 04/01/2024 03:27:00 PM COMPARISON: 04/01/2024 CLINICAL HISTORY: Pneumothorax FINDINGS: LINES, TUBES AND DEVICES: Left chest tube removed. LUNGS AND PLEURA: Low lung volumes. Bibasilar atelectasis. HEART AND MEDIASTINUM: No acute abnormality of the cardiac and mediastinal silhouettes. BONES AND SOFT TISSUES: No acute osseous abnormality. IMPRESSION: 1. No pneumothorax following chest tube removal on the left 2. Low lung volumes with bibasilar atelectasis. Electronically signed by: Oneil Devonshire MD  04/01/2024 07:14 PM EST RP Workstation: [id number]   DG Chest Port 1 View Result Date: 04/01/2024 EXAM: 1 VIEW(S) XRAY OF THE CHEST 04/01/2024 06:40:00 AM COMPARISON: 03/31/2024 CLINICAL HISTORY: S/P lobectomy of lung FINDINGS: LINES, TUBES AND DEVICES: Left chest tube stable in place. LUNGS AND PLEURA: Low lung volumes. Stable bibasilar atelectasis. No focal pulmonary opacity. No pulmonary edema. No pleural effusion. No pneumothorax. HEART AND MEDIASTINUM: No acute abnormality of the cardiac and mediastinal silhouettes. BONES AND SOFT TISSUES: No acute osseous abnormality. IMPRESSION: 1. Stable bibasilar atelectasis and low lung volumes. 2. Left chest tube stable in place. No pneumothorax Electronically signed by: Rockey Kilts MD 04/01/2024 10:33 AM EST RP Workstation: [id number]   DG Chest Port 1 View Result Date: 03/31/2024 EXAM: 1 VIEW(S) XRAY OF THE CHEST 03/31/2024 11:36:00 AM COMPARISON: Chest x-ray 03/29/2013. Chest CT 02/03/2004. CLINICAL HISTORY: S/P lobectomy of lung. FINDINGS: LINES, TUBES AND DEVICES: New left-sided chest tube in place. LUNGS AND PLEURA: No focal pulmonary opacity. No pulmonary edema. No pleural effusion. No pneumothorax. No new bands of atelectasis in the right lower lung. HEART AND MEDIASTINUM: No acute abnormality of the cardiac and mediastinal silhouettes. BONES AND SOFT TISSUES: No acute osseous abnormality. IMPRESSION: 1. No acute process. 2. New left-sided chest tube in place. Electronically signed by: Greig Pique MD 03/31/2024 08:35 PM EST RP Workstation: [id number]   DG Chest 2 View Result Date: 03/30/2024 CLINICAL DATA:  Preop evaluation.  Atypical carcinoid lung tumor. EXAM: CHEST - 2 VIEW COMPARISON:  Radiograph 03/06/2024, CT 02/03/2024 FINDINGS: The cardiomediastinal contours are stable. Aortic tortuosity. Left infrahilar fiducial marker, known nodule is faintly visualized. Pulmonary vasculature is normal. No consolidation, pleural effusion, or pneumothorax. No  acute osseous abnormalities are seen. IMPRESSION: 1. No acute chest findings. 2. Left infrahilar fiducial marker, known nodule is faintly visualized. Electronically Signed   By: Andrea Gasman M.D.   On: 03/30/2024 17:42

## 2024-04-25 NOTE — Assessment & Plan Note (Signed)
 This is likely provoked secondary to recent hospitalization/immobilization due to surgery. Recommend patient to continue Eliquis  5 mg twice daily, plan 6 months of anticoagulation.

## 2024-04-28 ENCOUNTER — Telehealth: Payer: Self-pay

## 2024-04-28 ENCOUNTER — Telehealth (HOSPITAL_COMMUNITY): Payer: Self-pay

## 2024-04-28 NOTE — Telephone Encounter (Signed)
 Copied from CRM [id number]. Topic: Clinical - Medical Advice >> Apr 28, 2024  1:14 PM Wess RAMAN wrote: Reason for CRM: Patient would like to know if it safe to take digestive and immune support probiotics with his current medications  Callback #: [id number]

## 2024-04-28 NOTE — Telephone Encounter (Signed)
 Spoke with patient who advised he was having surgery to remove something in throat and wanted to wait until post op to have swallow study // op mbs order deferred 30 days, then will follow up (ah)

## 2024-05-01 ENCOUNTER — Telehealth: Payer: Self-pay

## 2024-05-01 ENCOUNTER — Other Ambulatory Visit

## 2024-05-01 DIAGNOSIS — F17210 Nicotine dependence, cigarettes, uncomplicated: Secondary | ICD-10-CM

## 2024-05-01 DIAGNOSIS — C7A090 Malignant carcinoid tumor of the bronchus and lung: Secondary | ICD-10-CM

## 2024-05-01 DIAGNOSIS — F17200 Nicotine dependence, unspecified, uncomplicated: Secondary | ICD-10-CM

## 2024-05-01 DIAGNOSIS — Z902 Acquired absence of lung [part of]: Secondary | ICD-10-CM

## 2024-05-01 LAB — COMPREHENSIVE METABOLIC PANEL WITH GFR
AG Ratio: 1.4 (calc) (ref 1.0–2.5)
ALT: 34 U/L (ref 9–46)
AST: 47 U/L — ABNORMAL HIGH (ref 10–35)
Albumin: 4.7 g/dL (ref 3.6–5.1)
Alkaline phosphatase (APISO): 71 U/L (ref 35–144)
BUN: 11 mg/dL (ref 7–25)
CO2: 28 mmol/L (ref 20–32)
Calcium: 9.5 mg/dL (ref 8.6–10.3)
Chloride: 104 mmol/L (ref 98–110)
Creat: 1.13 mg/dL (ref 0.70–1.35)
Globulin: 3.4 g/dL (ref 1.9–3.7)
Glucose, Bld: 102 mg/dL (ref 65–139)
Potassium: 4.1 mmol/L (ref 3.5–5.3)
Sodium: 139 mmol/L (ref 135–146)
Total Bilirubin: 0.3 mg/dL (ref 0.2–1.2)
Total Protein: 8.1 g/dL (ref 6.1–8.1)
eGFR: 71 mL/min/1.73m2 (ref >=60)

## 2024-05-01 NOTE — Patient Instructions (Signed)
 [facility] - I am sorry I was unable to reach you today for our scheduled appointment. I work with Zafirov, Clarissa A, MD and am calling to support your healthcare needs. Please contact me at [phone] at your earliest convenience. I look forward to speaking with you soon.   Thank you,  Thersia Hoar, BSW, MHA [place]  [facility] Social Worker, Population Health [phone]

## 2024-05-02 ENCOUNTER — Other Ambulatory Visit: Payer: Self-pay

## 2024-05-02 NOTE — Patient Outreach (Signed)
 Complex Care Management   Visit Note  05/02/2024  Name:  Jesse [name] MRN: [id number] DOB: 01-05-1956  Situation: Referral received 03/03/24 for Complex Care Management related to Current lung cancer; difficulty finding work; SDOH needs.  I obtained verbal consent from Patient.  Visit completed with Patient  on the phone  Background:   Past Medical History:  Diagnosis Date   Anxiety    Arthritis    Coronary artery disease    Depression 02/15/2014   Hypertension    Myocardial infarction [facility])    NSTEMI   PE (pulmonary thromboembolism) (HCC) 09/09/2015   Pre-diabetes    Pulmonary embolism (HCC)    Sleep disturbance 02/15/2014   Tremors of nervous system    Trochanteric bursitis of left hip 08/30/2018    Assessment: Patient Reported Symptoms:  Cognitive Cognitive Status: Alert and oriented to person, place, and time, Insightful and able to interpret abstract concepts, Normal speech and language skills      Neurological Neurological Review of Symptoms: No symptoms reported    HEENT HEENT Symptoms Reported: No symptoms reported HEENT Comment: No acute tooth pain at this time but he does need multiple teeth pulled for partial plate/dentures, he is currently exploring options for this, saw an Transport Planner and they have come up with a plan.  [name] waiting to save up $168 to pay up front.    Cardiovascular Cardiovascular Symptoms Reported: No symptoms reported    Respiratory Respiratory Symptoms Reported: Other: Other Respiratory Symptoms: He performs deep breathing exercises daily.  Been doing more walking for overall health, denies chest pain. Additional Respiratory Details: Had lobectomy on 03/31/24, he is back at home, no concerning symptoms.SABRA [name] aware to call surgeon or go to ED for sudden shortness of breath, pain.    Endocrine Endocrine Symptoms Reported: Not assessed Is patient diabetic?: No    Gastrointestinal Gastrointestinal Symptoms Reported: No symptoms  reported      Genitourinary Genitourinary Symptoms Reported: No symptoms reported    Integumentary Integumentary Symptoms Reported: No symptoms reported    Musculoskeletal Musculoskelatal Symptoms Reviewed: No symptoms reported        Psychosocial Psychosocial Symptoms Reported: No symptoms reported          05/02/2024    PHQ2-9 Depression Screening   Little interest or pleasure in doing things    Feeling down, depressed, or hopeless    PHQ-2 - Total Score    Trouble falling or staying asleep, or sleeping too much    Feeling tired or having little energy    Poor appetite or overeating     Feeling bad about yourself - or that you are a failure or have let yourself or your family down    Trouble concentrating on things, such as reading the newspaper or watching television    Moving or speaking so slowly that other people could have noticed.  Or the opposite - being so fidgety or restless that you have been moving around a lot more than usual    Thoughts that you would be better off dead, or hurting yourself in some way    PHQ2-9 Total Score    If you checked off any problems, how difficult have these problems made it for you to do your work, take care of things at home, or get along with other people    Depression Interventions/Treatment      There were no vitals filed for this visit.    Medications Reviewed Today   Medications  were not reviewed in this encounter     Recommendation:    PCP appt 05/10/24 for AWV (needs to discuss Hep C Screening, per [facility] Care Gaps).  Specialty provider follow-up : Thersia Hoar VBCI LCSW 05/12/24; Cardiothoracic 05/23/24  Patient will check with his local pharmacy to scheduled Shingrex vaccines Patient will receive Cologuard kit from [facility] visiting nurse, complete and mail in for results.    Follow Up Plan:   Telephone follow-up in 1 month  Santana Stamp BSN, CCM [place]  VBCI Population Health RN Care Manager Direct Dial:  [phone]  Fax: [phone]

## 2024-05-02 NOTE — Patient Instructions (Signed)
 Visit Information  Thank you for taking time to visit with me today. Please don't hesitate to contact me if I can be of assistance to you before our next scheduled appointment.  Your next care management appointment is by telephone on Tuesday, January 6th at 10:30am  Please call the care guide team at [phone] if you need to cancel, schedule, or reschedule an appointment.   Please call the [place]  National Suicide Prevention Lifeline: 571-424-0408 or TTY: 305-167-1646 TTY 606-291-3997) to talk to a trained counselor if you are experiencing a Mental Health or Behavioral Health Crisis or need someone to talk to.  Santana Stamp BSN, CCM [place]  VBCI Population Health RN Care Manager Direct Dial: [phone]  Fax: [phone]

## 2024-05-10 ENCOUNTER — Ambulatory Visit

## 2024-05-10 VITALS — BP 110/90 | HR 86

## 2024-05-10 DIAGNOSIS — Z86711 Personal history of pulmonary embolism: Secondary | ICD-10-CM

## 2024-05-10 DIAGNOSIS — I158 Other secondary hypertension: Secondary | ICD-10-CM | POA: Diagnosis not present

## 2024-05-10 DIAGNOSIS — Z85110 Personal history of malignant carcinoid tumor of bronchus and lung: Secondary | ICD-10-CM | POA: Insufficient documentation

## 2024-05-10 DIAGNOSIS — I252 Old myocardial infarction: Secondary | ICD-10-CM | POA: Diagnosis not present

## 2024-05-10 DIAGNOSIS — I2699 Other pulmonary embolism without acute cor pulmonale: Secondary | ICD-10-CM | POA: Diagnosis not present

## 2024-05-10 DIAGNOSIS — Z720 Tobacco use: Secondary | ICD-10-CM | POA: Diagnosis not present

## 2024-05-10 MED ORDER — NICOTINE 14 MG/24HR TD PT24: 14.0000 mg | MEDICATED_PATCH | Freq: Every day | TRANSDERMAL | 0 refills | Status: AC

## 2024-05-10 NOTE — Progress Notes (Signed)
 Progress Note  Physician: Adalyn Pennock A Kenasia Scheller, MD   HPI: Jesse [name] is a 68 y.o. male presenting on 05/10/2024 for Follow-up .  Discussed the use of AI scribe software for clinical note transcription with the patient, who gave verbal consent to proceed.  History of Present Illness   Jesse [name] is a 68 year old male with coronary artery disease and pulmonary embolism who presents for follow-up after a robotic-assisted left lower lobectomy for an atypical carcinoid tumor.  Postoperative respiratory status - Stable breathing since robotic-assisted left lower lobectomy for atypical carcinoid tumor. - Able to take deep breaths without difficulty. - No significant changes in respiratory status since surgery. - Increased activity tolerance and has been cleared to advance as tolerated  Anticoagulation and pulmonary embolism - On Eliquis  5 mg twice daily for anticoagulation following recent provoked pulmonary embolism due to postoperative hospitalization and immobilization. - No bleeding complications, including bleeding gums or hematuria.  Hypertension management - Takes amlodipine  5 mg daily for hypertension. Propranolol  ER 60 - Blood pressure measured at 110/90 mmHg. - History of coronary artery disease and myocardial infarction approximately two years ago. - No current chest pain or heaviness.  Smoking cessation - Actively working on smoking cessation. - Using nicotine  patches. 21 mg down and he is ready to wean down - Significant reduction in smoking.  Sexual function - Decreased libido and erectile dysfunction, attributed to recent surgery and age. - Testosterone  levels previously checked and within acceptable range for age.  Alcohol use and liver function - No alcohol use. - Single elevated liver enzyme.         Medical history:  Relevant past medical, surgical, family and social history reviewed and updated as indicated. Interim medical history  since our last visit reviewed.  Allergies and medications reviewed and updated.   ROS: Negative unless specifically indicated above in HPI.   Current Medications[1]       Objective:     BP (!) 110/90   Pulse 86   SpO2 99%   Wt Readings from Last 3 Encounters:  04/25/24 196 lb (88.9 kg)  04/24/24 196 lb (88.9 kg)  04/23/24 190 lb 4.1 oz (86.3 kg)    Physical Exam  Physical Exam Vitals reviewed.  Constitutional:      Appearance: Normal appearance. Well-developed with normal weight.  Cardiovascular:     Rate and Rhythm: Normal rate and regular rhythm. Normal heart sounds. Normal peripheral pulses Pulmonary:     Normal breath sounds with normal effort Skin:    General: Skin is warm and dry without noticeable rash. Neurological:     General: No focal deficit present.  Psychiatric:        Mood and Affect: Mood, behavior and cognition normal      Assessment & Plan:   Encounter Diagnoses  Name Primary?   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    Assessment and Plan    Pulmonary embolism, 2nd occurrence status post provoked recurrence Status post provoked recurrence,- Continue Eliquis  5 mg twice daily until July.  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 No complications. Breathing well-managed. - Resume physical activity gradually as tolerated.  Coronary artery disease with history of myocardial infarction Coronary artery disease with myocardial infarction two years ago. No current chest pain. Smoking cessation advised. - Continue current medications including  amlodipine  and propranolol .  Pravastatin  40 mg - Encouraged smoking cessation.  Nicotine  dependence, cigarettes Using nicotine  patches (21 mg) for smoking cessation. Smoking reduced. Discussed importance of cessation for coronary artery disease and recent surgery. - Wean down nicotine  patches from 21 mg to 14 mg, then to 7 mg. - Consider switching to nicotine  gum if needed.  F/u end of March or PRN           [1]  Current Outpatient Medications:    acetaminophen  (TYLENOL ) 325 MG tablet, Take 2 tablets (650 mg total) by mouth every 6 (six) hours as needed for mild pain (pain score 1-3)., Disp: , Rfl:    amLODipine  (NORVASC ) 5 MG tablet, Take 5 mg by mouth daily., Disp: , Rfl:    apixaban  (ELIQUIS ) 5 MG TABS tablet, Take 1 tablet (5 mg total) by mouth 2 (two) times daily. Start AFTER finishing the starter pack, Disp: 180 tablet, Rfl: 1   APIXABAN  (ELIQUIS ) VTE STARTER PACK (10MG  AND 5MG ), Take as directed on package: start with two-5mg  tablets twice daily for 7 days. On day 8, switch to one-5mg  tablet twice daily., Disp: 74 each, Rfl: 0   buPROPion  (WELLBUTRIN  SR) 150 MG 12 hr tablet, Take 150 mg by mouth 2 (two) times daily., Disp: , Rfl:    gabapentin  (NEURONTIN ) 300 MG capsule, Take 300 mg by mouth 3 (three) times daily., Disp: , Rfl:    nicotine  (NICODERM CQ  - DOSED IN MG/24 HOURS) 21 mg/24hr patch, Place 1 patch (21 mg total) onto the skin daily., Disp: 90 patch, Rfl: 0   nicotine  (NICODERM CQ ) 14 mg/24hr patch, Place 1 patch (14 mg total) onto the skin daily., Disp: 28 patch, Rfl: 0   omeprazole  (PRILOSEC) 20 MG capsule, Take 1 capsule (20 mg total) by mouth daily before breakfast., Disp: 30 capsule, Rfl: 3   pravastatin  (PRAVACHOL ) 40 MG tablet, Take 40 mg by mouth daily., Disp: , Rfl:    propranolol  ER (INDERAL  LA) 60 MG 24 hr capsule, Take 1 capsule (60 mg total) by mouth daily. For tremor, Disp: 30 capsule, Rfl: 1

## 2024-05-12 ENCOUNTER — Other Ambulatory Visit: Payer: Self-pay | Admitting: Thoracic Surgery (Cardiothoracic Vascular Surgery)

## 2024-05-12 ENCOUNTER — Other Ambulatory Visit: Payer: Self-pay

## 2024-05-12 DIAGNOSIS — R911 Solitary pulmonary nodule: Secondary | ICD-10-CM

## 2024-05-12 NOTE — Patient Instructions (Signed)
 Visit Information  Thank you for taking time to visit with me today. Please don't hesitate to contact me if I can be of assistance to you before our next scheduled appointment.  Your next care management appointment is by telephone on 06/02/24 at 11am    Please call the care guide team at [phone] if you need to cancel, schedule, or reschedule an appointment.   Please call the Suicide and Crisis Lifeline: 988 call the [place]  National Suicide Prevention Lifeline: (289)146-4677 or TTY: 3466855067 TTY 863-119-7254) to talk to a trained counselor call 1-800-273-TALK (toll free, 24 hour hotline) call 911 if you are experiencing a Mental Health or Behavioral Health Crisis or need someone to talk to.  Thersia Hoar, HEDWIG, MHA [place]  [facility] Social Worker, Population Health [phone]

## 2024-05-12 NOTE — Patient Outreach (Signed)
 Social Drivers of Health  Community Resource and Care Coordination Visit Note   05/12/2024  Name: Jesse [name] MRN: [id number] DOB:08-10-1955  Situation: Referral received for [facility] needs assessment and assistance related to Housing . I obtained verbal consent from Patient.  Visit completed with Patient on the phone.   Background:   SDOH Interventions Today    Flowsheet Row Most Recent Value  SDOH Interventions   Food Insecurity Interventions Community Resources Provided  Housing Interventions Community Resources Provided     Assessment:   Goals Addressed             This Visit's Progress    BSW VBCI Social Work Care Plan       Problems:   Corporate Treasurer  and Food Insecurity   CSW Clinical Goal(s):   Over the next 30 days the Patient will work with Child Psychotherapist to address concerns related to food.  Interventions:  SW emailed resources for food and finances SW emailed resources for independent living facilities in [place], [place], and [employer].  Patient Goals/Self-Care Activities:  Patient will use resources SW sent for food assistance Visit Independent facilities.  Plan:   The care management team will reach out to the patient again over the next 15 days.        Recommendation:   follow up with independent living facilities regarding housing needs  Follow Up Plan:   Telephone follow-up 06/02/24 at 11am  Thersia Hoar, BSW, [facility] [place]  [facility] Social Worker, Population Health [phone]

## 2024-05-23 ENCOUNTER — Ambulatory Visit: Payer: Self-pay | Admitting: Thoracic Surgery (Cardiothoracic Vascular Surgery)

## 2024-05-23 ENCOUNTER — Other Ambulatory Visit (HOSPITAL_COMMUNITY)

## 2024-05-30 ENCOUNTER — Other Ambulatory Visit: Payer: Self-pay

## 2024-05-30 NOTE — Patient Instructions (Signed)
 Visit Information  Thank you for taking time to visit with me today. Please don't hesitate to contact me if I can be of assistance to you before our next scheduled appointment.  Your next care management appointment is by telephone on Tuesday, February 4th at 10:30am.    Please call the care guide team at [phone] if you need to cancel, schedule, or reschedule an appointment.   Please call the [place]  National Suicide Prevention Lifeline: 209-779-8455 or TTY: (618)674-2496 TTY 848-244-1353) to talk to a trained counselor if you are experiencing a Mental Health or Behavioral Health Crisis or need someone to talk to.  Santana Stamp BSN, CCM [place]  VBCI Population Health RN Care Manager Direct Dial: [phone]  Fax: [phone]

## 2024-05-30 NOTE — Patient Outreach (Signed)
 Complex Care Management   Visit Note  05/30/2024  Name:  Jesse [name] MRN: [id number] DOB: August 19, 1955  Situation: Referral received for Complex Care Management related to SDOH Barriers:  Food insecurity Financial Resource Strain and Lung Cancer. I obtained verbal consent from Patient.  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  No evidence of hilar or mediastinal adenopathy.  Clinical stage is 1A (T1, N0).      Coronary artery disease    Depression 02/15/2014   Hypertension    Myocardial infarction [facility])    NSTEMI   PE (pulmonary thromboembolism) (HCC) 09/09/2015   Pre-diabetes    Pulmonary embolism (HCC)    Sleep disturbance 02/15/2014   Tremors of nervous system    Trochanteric bursitis of left hip 08/30/2018    Assessment: Patient Reported Symptoms:  Cognitive Cognitive Status: Alert and oriented to person, place, and time, Insightful and able to interpret abstract concepts, Normal speech and language skills      Neurological Neurological Review of Symptoms: No symptoms reported    HEENT HEENT Symptoms Reported: No symptoms reported HEENT Comment: No acute tooth pain at this time but still needs multiple teeth pulled for partial plate/dentures, trying to save up $168 to pay up front.    Cardiovascular Cardiovascular Symptoms Reported: No symptoms reported    Respiratory Respiratory Symptoms Reported: No symptoms reported Additional Respiratory Details: Goes for Low-dose lung xray today. Using Nicotine  patch 21mg  daily for smoking cessation, smokes 1-2 cigarettes a day by taking a few puffs throughout the day.  He believes the 21mg  patch allows him to eat and has cut down the urge to smoke all day long.    Endocrine Endocrine Symptoms Reported: Not assessed Is patient diabetic?: No     Gastrointestinal Gastrointestinal Symptoms Reported: No symptoms reported Gastrointestinal Comment: [employer] nurse has not visited to deliver a Cologuard test kit (states some company out of [place]  called him to schedule and he was skeptical of the call, plans on calling [employer] to confirm), so he has not received a Cologuard test as of yet.    Genitourinary Genitourinary Symptoms Reported: Not assessed    Integumentary Integumentary Symptoms Reported: Not assessed    Musculoskeletal Musculoskelatal Symptoms Reviewed: No symptoms reported        Psychosocial Psychosocial Symptoms Reported: Not assessed          05/30/2024    PHQ2-9 Depression Screening   Little interest or pleasure in doing things    Feeling down, depressed, or hopeless    PHQ-2 - Total Score    Trouble falling or staying asleep, or sleeping too much    Feeling tired or having little energy    Poor appetite or overeating     Feeling bad about yourself - or that you are a failure or have let yourself or your family down    Trouble concentrating on things, such as reading the newspaper or watching television    Moving or speaking so slowly that other people could have noticed.  Or the opposite - being so fidgety or restless that you have been moving around a lot more than usual    Thoughts that you would be better off dead, or hurting yourself in some way    PHQ2-9 Total Score    If you  checked off any problems, how difficult have these problems made it for you to do your work, take care of things at home, or get along with other people    Depression Interventions/Treatment      There were no vitals filed for this visit.    MEDICATIONS: Denies problems with refills, taking meds as prescribed.    Recommendation:   Specialty provider follow-up : chest xray today; [facility] V[facility]I BSW 06/02/24: Cardiology 06/16/24; Cardiothoracic 06/06/24 Continue Current Plan of Care Patient is aware of smoking cessation program through  [place], [facility], and  [employer], declines contact info for these services today, stating he is aware of what he must do to quit smoking.   Follow Up Plan:   Telephone follow-up in 1 month  Santana Stamp BSN, CCM [place]  VBCI Population Health RN Care Manager Direct Dial: [phone]  Fax: [phone]

## 2024-06-02 ENCOUNTER — Telehealth: Payer: Self-pay

## 2024-06-02 NOTE — Patient Instructions (Signed)
 [facility] - I am sorry I was unable to reach you today for our scheduled appointment. I work with Zafirov, Clarissa A, MD and am calling to support your healthcare needs. Please contact me at [phone] at your earliest convenience. I look forward to speaking with you soon.   Thank you,  Thersia Hoar, BSW, MHA [place]  [facility] Social Worker, Population Health [phone]

## 2024-06-05 ENCOUNTER — Other Ambulatory Visit (HOSPITAL_COMMUNITY): Payer: Self-pay

## 2024-06-05 DIAGNOSIS — R1310 Dysphagia, unspecified: Secondary | ICD-10-CM

## 2024-06-05 DIAGNOSIS — R059 Cough, unspecified: Secondary | ICD-10-CM

## 2024-06-06 ENCOUNTER — Ambulatory Visit (HOSPITAL_COMMUNITY)
Admission: RE | Admit: 2024-06-06 | Discharge: 2024-06-06 | Disposition: A | Discharge status: Home / Self Care | Source: Ambulatory Visit | Attending: Thoracic Surgery (Cardiothoracic Vascular Surgery) | Admitting: Thoracic Surgery (Cardiothoracic Vascular Surgery)

## 2024-06-06 ENCOUNTER — Ambulatory Visit: Payer: Self-pay | Attending: Thoracic Surgery (Cardiothoracic Vascular Surgery)

## 2024-06-06 VITALS — BP 126/85 | HR 83 | Resp 20 | Ht 71.0 in | Wt 203.9 lb

## 2024-06-06 DIAGNOSIS — R911 Solitary pulmonary nodule: Secondary | ICD-10-CM | POA: Insufficient documentation

## 2024-06-06 DIAGNOSIS — Z85110 Personal history of malignant carcinoid tumor of bronchus and lung: Secondary | ICD-10-CM

## 2024-06-06 NOTE — Progress Notes (Unsigned)
 "     [address] [name] [address]             [phone]       HPI:  Patient returns for routine postoperative follow-up having undergone a robotic assisted left lower lobectomy on 03/31/2024 with Dr. Kerrin.  Postoperative course was unremarkable and went home on day 2.  He was on low-dose Lovenox  postoperatively.  He was seen in the emergency department on 04/22/2024 for left sided chest pain.  CTA of chest was obtained which showed some small subsegmental pulmonary emboli on the right.  He was also found to have a left pleural effusion.   He presents today for continued follow up.  He states that he has been doing well.  His does complain of post nasal drip with throat congestion and coughing. [name] followed by ENT and plans on having nasal turbinate reduction procedure in the coming weeks. He has been taking eliquis  as prescribed for pulmonary emboli. Reports no issues with bleeding.  [name] actively working on smoking cessation.  He has been wearing the patch but is still smoking some cigarettes.  He denies shortness of breath, chest pain/tightness, lower leg edema, lower leg swelling/redness, tachycardia, and palpitations.     Allergies as of 06/06/2024       Reactions   Bactrim [sulfamethoxazole-trimethoprim] Other (See Comments)   Acute kidney injury   Penicillins Hives        Medication List        Accurate as of June 06, 2024  4:22 PM. If you have any questions, ask your nurse or doctor.          acetaminophen  325 MG tablet Commonly known as: Tylenol  Take 2 tablets (650 mg total) by mouth every 6 (six) hours as needed for mild pain (pain score 1-3).   amLODipine  5 MG tablet Commonly known as: NORVASC  Take 5 mg by mouth daily.   Apixaban  Starter Pack (10mg  and 5mg ) Commonly known as: ELIQUIS  STARTER PACK Take as directed on package: start with two-5mg  tablets twice daily for 7 days. On day 8, switch to one-5mg  tablet twice  daily.   apixaban  5 MG Tabs tablet Commonly known as: ELIQUIS  Take 1 tablet (5 mg total) by mouth 2 (two) times daily. Start AFTER finishing the starter pack   buPROPion  150 MG 12 hr tablet Commonly known as: WELLBUTRIN  SR Take 150 mg by mouth 2 (two) times daily.   gabapentin  300 MG capsule Commonly known as: NEURONTIN  Take 300 mg by mouth 3 (three) times daily.   nicotine  21 mg/24hr patch Commonly known as: NICODERM CQ  - dosed in mg/24 hours Place 1 patch (21 mg total) onto the skin daily.   nicotine  14 mg/24hr patch Commonly known as: Nicoderm CQ  Place 1 patch (14 mg total) onto the skin daily.   omeprazole  20 MG capsule Commonly known as: PRILOSEC Take 1 capsule (20 mg total) by mouth daily before breakfast.   pravastatin  40 MG tablet Commonly known as: PRAVACHOL  Take 40 mg by mouth daily.   propranolol  ER 60 MG 24 hr capsule Commonly known as: Inderal  LA Take 1 capsule (60 mg total) by mouth daily. For tremor         ROS Review of Systems  Constitutional:  Negative for chills, fever and malaise/fatigue.  HENT:  Positive for congestion.   Respiratory:  Positive for cough. Negative for shortness of breath and wheezing.   Cardiovascular:  Negative for chest pain, palpitations and leg swelling.      BP 126/85 (BP Location: Left Arm, Patient Position: Sitting, Cuff Size: Normal)   Pulse 83   Resp 20   Ht 5' 11 (1.803 m)   Wt 203 lb 14.4 oz (92.5 kg)   SpO2 97% Comment: RA  BMI 28.44 kg/m   Physical Exam Constitutional:      Appearance: Normal appearance.  HENT:     Head: Normocephalic and atraumatic.  Cardiovascular:     Rate and Rhythm: Normal rate and regular rhythm.     Heart sounds: Normal heart sounds, S1 normal and S2 normal.  Pulmonary:     Effort: Pulmonary effort is normal.     Breath sounds: Normal breath sounds.  Skin:    General: Skin is warm and dry.     Comments: Incision sites healed  Neurological:     General: No focal deficit  present.     Mental Status: [name] alert and oriented to person, place, and time.       Imaging: CLINICAL DATA:  Lung nodule   EXAM: CHEST - 2 VIEW   COMPARISON:  04/24/2024, chest CT 04/22/2024 PET CT 02/21/2024, PET CT 04/17/2024   FINDINGS: No significant change in left retrocardiac opacification. Stable cardiomediastinal silhouette. No pneumothorax   IMPRESSION: No significant change in left retrocardiac opacification compared to prior radiograph which may be due to pleural effusion and volume loss/surgical changes on the prior CT from November.     Electronically Signed   By: Jesse [name] M.D.   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  -Stable left sided pleural effusion which is common after lobectomy.  This has not increased in size.  He continues to remain without respiratory symptoms.  -He should remain active and increase as tolerated -Discussed the importance of smoking cessation. [name] to continue to use the patch and work towards complete cessation.  -[name] to continue Eliquis  as prescribed for pulmonary emboli per oncology plan on continuing for 6 months -He has had follow up with oncology and no adjuvant therapy is recommended.  Will continue with surveillance. He has follow up scheduled on 07/24/2024 -Follow up in 6 months with Dr. Kerrin Manuelita CHRISTELLA Rutha, PA-C 4:22 PM 06/06/2024  "

## 2024-06-07 NOTE — Patient Instructions (Addendum)
-  Follow up in 6 months with Dr. Kerrin -Continue follow up with oncology as scheduled

## 2024-06-12 ENCOUNTER — Encounter (HOSPITAL_BASED_OUTPATIENT_CLINIC_OR_DEPARTMENT_OTHER): Payer: Self-pay | Admitting: Anesthesiology

## 2024-06-13 ENCOUNTER — Encounter (HOSPITAL_BASED_OUTPATIENT_CLINIC_OR_DEPARTMENT_OTHER): Payer: Self-pay

## 2024-06-13 ENCOUNTER — Other Ambulatory Visit: Payer: Self-pay

## 2024-06-13 ENCOUNTER — Telehealth: Payer: Self-pay

## 2024-06-13 ENCOUNTER — Telehealth (INDEPENDENT_AMBULATORY_CARE_PROVIDER_SITE_OTHER): Payer: Self-pay

## 2024-06-13 NOTE — Telephone Encounter (Signed)
 Margarie from [facility] returned your call.  [phone]

## 2024-06-13 NOTE — Patient Instructions (Signed)
 [facility] [place] - I am sorry I was unable to reach you today for our scheduled appointment. I work with Zafirov, Clarissa A, MD and am calling to support your healthcare needs. Please contact me at [phone] at your earliest convenience. I look forward to speaking with you soon.   Thank you,  Thersia Hoar, BSW, MHA [place]  [facility] Social Worker, Population Health [phone]

## 2024-06-13 NOTE — Progress Notes (Addendum)
" °   06/13/24 9075  PAT Phone Screen  Is the patient taking a GLP-1 receptor agonist? No  Do You Have Diabetes? No  Do You Have Hypertension? Yes  Have You Ever Been to the ER for Asthma? No  Have You Taken Oral Steroids in the Past 3 Months? No  Do you Take Phenteramine or any Other Diet Drugs? No  Recent  Lab Work, EKG, CXR? (S)  Yes  Where was this test performed? (S)  EKG 04/24/2024, CMP 05/01/2024, CTA 2025 ([facility])  Do you have a history of heart problems? (S)  Yes  Cardiologist Name MARYLN)  Morene Pepper, PA  Have you ever had tests on your heart? Yes  What cardiac tests were performed? (S)  EKG;Labs;Echo  What date/year were cardiac tests completed? (S)  Echo 2024, ekg lab 2025  Results viewable: [facility] Media Tab;Care Everywhere  Any Recent Hospitalizations? No  Height 5' 11 (1.803 m)  Weight 91.6 kg  Pat Appointment Scheduled No  Reason for No Appointment Not Needed   Reviewed chart with Dr. Patrisha ok for [facility]   Left a message with Dr. Willeen scheduler requesting Eliquis  orders. Mr. Licausi has a cardiology apt. On 1/23, I instructed him to ask about his Eliquis  and whether or not he should continue or hold it at that appointment.  "

## 2024-06-13 NOTE — Telephone Encounter (Signed)
 I faxed request for Medical Clearance to Dr. Elspeth Millers to [phone]. It was successfully sent.

## 2024-06-14 ENCOUNTER — Telehealth: Payer: Self-pay | Admitting: Internal Medicine

## 2024-06-14 NOTE — Telephone Encounter (Signed)
 I called [facility] back to get correct Fax #. The one on recording is not correct.

## 2024-06-14 NOTE — Telephone Encounter (Signed)
"  ° °  Pre-operative Risk Assessment    Patient Name: Jesse [name]  DOB: [birth year] MRN: [id number]   Date of last office visit: 03/15/2024 Date of next office visit: 06/16/2024   Request for Surgical Clearance    Procedure:  Nasal Turbinate Reduction  Date of Surgery:  Clearance 06/19/24                                Surgeon:  Dr. Anice Socks Group or Practice Name:  [facility] Phone number:  [phone] Fax number:  [phone]   Type of Clearance Requested:   - Pharmacy:  Hold Apixaban  (Eliquis ) 5mg    Type of Anesthesia:  General    Additional requests/questions:    [name]   06/14/2024, 2:38 PM   "

## 2024-06-14 NOTE — Telephone Encounter (Signed)
" ° °  Patient Name: Jesse [name]  DOB: 14-Jul-1955 MRN: [id number]  Primary Cardiologist: None  Chart reviewed as part of pre-operative protocol coverage. Pre-op clearance already addressed by colleagues in earlier phone notes. To summarize recommendations:  - We do not manage his Eliquis . It looks like he saw Dr. Babara at hematology in the setting of previous acute PE.   Will route this bundled recommendation to requesting provider via Epic fax function and remove from pre-op pool. Please call with questions.  Mardy KATHEE Pizza, FNP 06/14/2024, 3:07 PM  "

## 2024-06-14 NOTE — Telephone Encounter (Signed)
 Patient has Cardiac appt. 06/16/24

## 2024-06-14 NOTE — Telephone Encounter (Signed)
 Dr. Kerrin replied back to clearance request, he does not do medical clearance. I sent a request to [facility] in [place] to fax # [phone].

## 2024-06-15 ENCOUNTER — Telehealth: Payer: Self-pay | Admitting: *Deleted

## 2024-06-15 ENCOUNTER — Telehealth: Payer: Self-pay | Admitting: Oncology

## 2024-06-15 ENCOUNTER — Telehealth (INDEPENDENT_AMBULATORY_CARE_PROVIDER_SITE_OTHER): Payer: Self-pay

## 2024-06-15 NOTE — Telephone Encounter (Signed)
 Dr. Babara replied back to faxed Medical Clearance. He recommended that surgery be postponed until 07/23/2024.

## 2024-06-15 NOTE — Telephone Encounter (Signed)
 She spoke to [employer] as well. They will be faxing over clearance form for Dr .Babara to sign

## 2024-06-15 NOTE — Telephone Encounter (Signed)
 Received clearance letter and faxed back to ENT. Per Dr. Babara  recent Pet on 04/22/2024, recommend to postpone elective procedure until 3 months later (07/23/2024)

## 2024-06-15 NOTE — Telephone Encounter (Signed)
 I successful sent  Medical Clearance to Dr. Babara. I called and the confirm that this was the right provider for Eliquis  hold.I spoke w/Lauren. She confirmed with Dr. Babara.

## 2024-06-15 NOTE — Telephone Encounter (Signed)
 Jesse [name], CMA at [facility] health ENT [phone] ext [phone]) called cancer ctr triage. Patient needs medical clearance asap to stop Eliquis  for an upcoming surgery this Monday 06/19/24 (Nasal Turbinate Reduction).

## 2024-06-16 ENCOUNTER — Ambulatory Visit: Admitting: Medical

## 2024-06-16 NOTE — Progress Notes (Unsigned)
" °  Cardiology Office Note   Date:  06/16/2024  ID:  Jesse [name], Jesse [name] 08-Nov-1955, MRN [id number] PCP: Everlene Parris LABOR, MD  [facility] Providers Cardiologist:  None { Click to update primary MD,subspecialty MD or APP then REFRESH:1}    History of Present Illness Jesse [name] is a 69 y.o. 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   Initial diagnosis of CAD was made in August/2023 when he was admitted with left-sided back and shoulder pain at [facility].  Cardiac catheterization at that time showed CTO of the left circumflex with elevated troponin felt to be due to supply/demand mismatch.   Patient was seen September 2025 as a new patient.  He reported sporadic vague chest discomfort and chronic exertional dyspnea.  The patient was last seen 02/2024 for preoperative clearance. No further cardiac work-up was recommended.   ROS: ***  Studies Reviewed      *** Risk Assessment/Calculations {Does this patient have ATRIAL FIBRILLATION?[phone]} No BP recorded.  {Refresh Note OR Click here to enter BP  :1}***       Physical Exam VS:  There were no vitals taken for this visit.       Wt Readings from Last 3 Encounters:  06/06/24 203 lb 14.4 oz (92.5 kg)  04/25/24 196 lb (88.9 kg)  04/24/24 196 lb (88.9 kg)    GEN: Well nourished, well developed in no acute distress NECK: No JVD; No carotid bruits CARDIAC: ***RRR, no murmurs, rubs, gallops RESPIRATORY:  Clear to auscultation without rales, wheezing or rhonchi  ABDOMEN: Soft, non-tender, non-distended EXTREMITIES:  No edema; No deformity   ASSESSMENT AND PLAN ***    {Are you ordering a CV Procedure (e.g. stress test, cath, DCCV, TEE, etc)?   Press F2        :[id number]}  Dispo:  ***  Signed, Veronica Fretz VEAR Fishman, PA-C   "

## 2024-06-16 NOTE — Telephone Encounter (Deleted)
 error

## 2024-06-16 NOTE — Telephone Encounter (Signed)
 Sharlet from pt surgeron's office called to get form filled out by Dr so pt can be cleared for surgery on Monday [facility]

## 2024-06-19 ENCOUNTER — Ambulatory Visit (HOSPITAL_BASED_OUTPATIENT_CLINIC_OR_DEPARTMENT_OTHER): Admission: RE | Admit: 2024-06-19 | Source: Home / Self Care

## 2024-06-19 ENCOUNTER — Encounter (HOSPITAL_BASED_OUTPATIENT_CLINIC_OR_DEPARTMENT_OTHER): Admission: RE | Payer: Self-pay | Source: Home / Self Care

## 2024-06-21 ENCOUNTER — Encounter (HOSPITAL_COMMUNITY)

## 2024-06-26 ENCOUNTER — Ambulatory Visit (INDEPENDENT_AMBULATORY_CARE_PROVIDER_SITE_OTHER)

## 2024-06-28 ENCOUNTER — Other Ambulatory Visit: Payer: Self-pay

## 2024-06-28 NOTE — Patient Outreach (Signed)
 Complex Care Management   Visit Note  06/28/2024  Name:  Jesse [name] MRN: [id number] DOB: 08/03/1955  Situation: Referral received for Complex Care Management related to Lung Cancer, difficulty finding work.  I obtained verbal consent from Patient.  Visit completed with Jesse [name]  on the phone.  Kept call brief due to patient was driving.   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  No evidence of hilar or mediastinal adenopathy.  Clinical stage is 1A (T1, N0).      Coronary artery disease    Depression 02/15/2014   Hypertension    Myocardial infarction [facility])    NSTEMI   PE (pulmonary thromboembolism) (HCC) 09/09/2015   Pre-diabetes    Pulmonary embolism (HCC)    Sleep disturbance 02/15/2014   Tremors of nervous system    Trochanteric bursitis of left hip 08/30/2018    Assessment: Patient Reported Symptoms:  Cognitive Cognitive Status: Alert and oriented to person, place, and time, Insightful and able to interpret abstract concepts, Normal speech and language skills      Neurological Neurological Review of Symptoms: No symptoms reported    HEENT HEENT Symptoms Reported: No symptoms reported      Cardiovascular Cardiovascular Symptoms Reported: Not assessed    Respiratory Respiratory Symptoms Reported: Not assesed    Endocrine Endocrine Symptoms Reported: Not assessed Is patient diabetic?: No    Gastrointestinal Gastrointestinal Symptoms Reported: No symptoms reported      Genitourinary Genitourinary Symptoms Reported: Malodorous urine Additional Genitourinary Details: Denies burning/blood in urine/discoloration/pain.  Reports last time his urine had odor a urinalysis was performed and he did have a UTI. Genitourinary Self-Management Outcome: 3 (uncertain) Genitourinary Comment: Offered to warm transfer patient to [place] to report.   He was driving, I called him back and left the [place] contact number on his voicemail so he can call when he reaches his destination.  Integumentary Integumentary Symptoms Reported: Not assessed    Musculoskeletal Musculoskelatal Symptoms Reviewed: Not assessed        Psychosocial Psychosocial Symptoms Reported: Not assessed          06/28/2024    PHQ2-9 Depression Screening   Little interest or pleasure in doing things    Feeling down, depressed, or hopeless    PHQ-2 - Total Score    Trouble falling or staying asleep, or sleeping too much    Feeling tired or having little energy    Poor appetite or overeating     Feeling bad about yourself - or that you are a failure or have let yourself or your family down    Trouble concentrating on things, such as reading the newspaper or watching television    Moving or speaking so slowly that other people could have noticed.  Or the opposite - being so fidgety or restless that you have been moving around a lot more than usual    Thoughts that you would be better off dead, or hurting yourself in some way    PHQ2-9 Total Score    If you checked off any problems, how difficult have these problems made it for you to do your work, take care of things at home, or get along with other people    Depression Interventions/Treatment      There were no vitals filed for this visit. Pain Scale: 0-10 Pain Score:  0-No pain  MEDICATIONS: patient denies concerns with meds, no issues with refills, taking as prescribed.    Recommendation:   Continue Current Plan of Care Patient has dental appt March 8th.   He will call PCP office to report malodorous urine.    Follow Up Plan:   Telephone follow-up in 1 month  Santana Stamp BSN, CCM [place]  VBCI Population Health RN Care Manager Direct Dial: [phone]  Fax: [phone]

## 2024-06-28 NOTE — Patient Instructions (Signed)
 Visit Information  Thank you for taking time to visit with me today. Please don't hesitate to contact me if I can be of assistance to you before our next scheduled appointment.  Your next care management appointment is by telephone on Wednesday, March 4th at 10:30am.  Please call the care guide team at [phone] if you need to cancel, schedule, or reschedule an appointment.   Please call the [place]  National Suicide Prevention Lifeline: (231) 210-3094 or TTY: 819 791 1304 TTY (703) 636-8421) to talk to a trained counselor if you are experiencing a Mental Health or Behavioral Health Crisis or need someone to talk to.  Santana Stamp BSN, CCM [place]  VBCI Population Health RN Care Manager Direct Dial: [phone]  Fax: [phone]

## 2024-06-29 ENCOUNTER — Other Ambulatory Visit: Payer: Self-pay

## 2024-06-29 DIAGNOSIS — E785 Hyperlipidemia, unspecified: Secondary | ICD-10-CM

## 2024-06-29 MED ORDER — PRAVASTATIN SODIUM 40 MG PO TABS: 40.0000 mg | ORAL_TABLET | Freq: Every day | ORAL | 0 refills | Status: AC

## 2024-06-29 NOTE — Telephone Encounter (Signed)
 Copied from CRM [id number]. Topic: Clinical - Medication Refill >> Jun 29, 2024 10:46 AM Joesph B wrote: Medication:  pravastatin  (PRAVACHOL ) 40 MG tablet    Has the patient contacted their pharmacy? Yes (Agent: If no, request that the patient contact the pharmacy for the refill. If patient does not wish to contact the pharmacy document the reason why and proceed with request.) (Agent: If yes, when and what did the pharmacy advise?)  This is the patient's preferred pharmacy:   [id number] [address] [facility] - [address] [place] [address] Phone: [phone] Fax: [phone]  Is this the correct pharmacy for this prescription? Yes If no, delete pharmacy and type the correct one.   Has the prescription been filled recently? Yes  Is the patient out of the medication? Yes  Has the patient been seen for an appointment in the last year OR does the patient have an upcoming appointment? Yes  Can we respond through MyChart? Yes  Agent: Please be advised that Rx refills may take up to 3 business days. We ask that you follow-up with your pharmacy. >> Jun 29, 2024 10:49 AM Joesph B wrote: Pt has not had this medication in 3 weeks, he would like for this to be sent today or tomorrow,

## 2024-06-29 NOTE — Telephone Encounter (Signed)
 Requested medication (s) are due for refill today: yes  Requested medication (s) are on the active medication list: yes  Last refill:  04/22/24  Future visit scheduled: yes  Notes to clinic:  Last refilled by ED provider, routing to PCP for approval. Patient is out of medication, refill is due.     Requested Prescriptions  Pending Prescriptions Disp Refills   pravastatin  (PRAVACHOL ) 40 MG tablet 30 tablet     Sig: Take 1 tablet (40 mg total) by mouth daily.     Cardiovascular:  Antilipid - Statins Failed - 06/29/2024 11:23 AM      Failed - Lipid Panel in normal range within the last 12 months    Cholesterol  Date Value Ref Range Status  02/17/2024 136 <200 mg/dL Final   LDL Cholesterol (Calc)  Date Value Ref Range Status  02/17/2024 82 mg/dL (calc) Final    Comment:    Reference range: <100 . Desirable range <100 mg/dL for primary prevention;   <70 mg/dL for patients with CHD or diabetic patients  with > or = 2 CHD risk factors. SABRA LDL-C is now calculated using the Martin-Hopkins  calculation, which is a validated novel method providing  better accuracy than the Friedewald equation in the  estimation of LDL-C.  Gladis APPLETHWAITE et al. SANDREA. 7986;689(80): 2061-2068  (http://education.Quest[employer].com/faq/FAQ164)    HDL  Date Value Ref Range Status  02/17/2024 29 (L) > OR = 40 mg/dL Final   Triglycerides  Date Value Ref Range Status  02/17/2024 155 (H) <150 mg/dL Final         Passed - Patient is not pregnant      Passed - Valid encounter within last 12 months    Recent Outpatient Visits           1 month ago Tobacco use   [place] [facility] [place], [place], MD   2 months ago Status post lobectomy of lung   [place] [facility] Everlene Parris LABOR, MD   3 months ago Carcinoid bronchial adenoma [facility])   [place] [facility] Everlene Parris LABOR, MD   3 months ago Pulmonary nodule, left   [place]  [facility] [place], Parris LABOR, MD   4 months ago Influenza vaccine needed   [facility] Everlene Parris LABOR, MD

## 2024-06-30 ENCOUNTER — Telehealth (HOSPITAL_COMMUNITY): Payer: Self-pay

## 2024-06-30 NOTE — Progress Notes (Signed)
 Jesse [name]                                          MRN: [id number]   06/30/2024   The VBCI Quality Team Specialist reviewed this patient medical record for the purposes of chart review for care gap closure. The following were reviewed: chart review for care gap closure-controlling blood pressure.    VBCI Quality Team

## 2024-06-30 NOTE — Progress Notes (Signed)
 Jesse [name] [place]                                          MRN: [id number]   06/30/2024   The VBCI Quality Team Specialist reviewed this patient medical record for the purposes of chart review for care gap closure. The following were reviewed: chart review for care gap closure-colorectal cancer screening.    VBCI Quality Team

## 2024-06-30 NOTE — Telephone Encounter (Signed)
 Spoke with patient about missing op mbs on 06/21/24 - he advised he wanted to talk with his doctor before proceeding. Keeping OP MBS order active until we hear back from patient. Will check back in 30 days.

## 2024-07-24 ENCOUNTER — Inpatient Hospital Stay

## 2024-07-24 ENCOUNTER — Inpatient Hospital Stay: Admitting: Oncology

## 2024-07-26 ENCOUNTER — Telehealth

## 2024-08-16 ENCOUNTER — Ambulatory Visit
